# Patient Record
Sex: Female | Born: 1995
Health system: Southern US, Community
[De-identification: ages and names within clinical notes are randomized; demographics above are authoritative.]

## PROBLEM LIST (undated history)

## (undated) DIAGNOSIS — R7303 Prediabetes: Secondary | ICD-10-CM

## (undated) DIAGNOSIS — N83201 Unspecified ovarian cyst, right side: Secondary | ICD-10-CM

## (undated) DIAGNOSIS — F32A Depression, unspecified: Secondary | ICD-10-CM

## (undated) DIAGNOSIS — N83202 Unspecified ovarian cyst, left side: Secondary | ICD-10-CM

## (undated) HISTORY — PX: EYE MUSCLE SURGERY: SHX370

---
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
## 2019-01-07 ENCOUNTER — Emergency Department (HOSPITAL_COMMUNITY)
Admission: EM | Admit: 2019-01-07 | Discharge: 2019-01-07 | Disposition: A | Discharge status: Home / Self Care | Payer: Self-pay | Attending: Emergency Medicine | Admitting: Emergency Medicine

## 2019-01-07 ENCOUNTER — Encounter (HOSPITAL_COMMUNITY): Payer: Self-pay | Admitting: *Deleted

## 2019-01-07 ENCOUNTER — Other Ambulatory Visit: Payer: Self-pay

## 2019-01-07 DIAGNOSIS — D649 Anemia, unspecified: Secondary | ICD-10-CM

## 2019-01-07 DIAGNOSIS — N390 Urinary tract infection, site not specified: Secondary | ICD-10-CM

## 2019-01-07 DIAGNOSIS — R110 Nausea: Secondary | ICD-10-CM | POA: Insufficient documentation

## 2019-01-07 DIAGNOSIS — Z79899 Other long term (current) drug therapy: Secondary | ICD-10-CM | POA: Insufficient documentation

## 2019-01-07 DIAGNOSIS — R7303 Prediabetes: Secondary | ICD-10-CM | POA: Insufficient documentation

## 2019-01-07 DIAGNOSIS — R1030 Lower abdominal pain, unspecified: Secondary | ICD-10-CM | POA: Insufficient documentation

## 2019-01-07 LAB — COMPREHENSIVE METABOLIC PANEL
ALBUMIN: 4.7 g/dL (ref 3.5–5.0)
ALT: 12 U/L (ref 0–44)
AST: 16 U/L (ref 15–41)
Alkaline Phosphatase: 52 U/L (ref 38–126)
Anion gap: 9 (ref 5–15)
BUN: 9 mg/dL (ref 6–20)
CHLORIDE: 108 mmol/L (ref 98–111)
CO2: 23 mmol/L (ref 22–32)
Calcium: 9.6 mg/dL (ref 8.9–10.3)
Creatinine, Ser: 0.73 mg/dL (ref 0.44–1.00)
GFR calc Af Amer: >60 mL/min (ref >60)
GFR calc non Af Amer: >60 mL/min (ref >60)
Glucose, Bld: 91 mg/dL (ref 70–99)
Potassium: 3.4 mmol/L — ABNORMAL LOW (ref 3.5–5.1)
Sodium: 140 mmol/L (ref 135–145)
Total Bilirubin: 0.5 mg/dL (ref 0.3–1.2)
Total Protein: 8.5 g/dL — ABNORMAL HIGH (ref 6.5–8.1)

## 2019-01-07 LAB — PREGNANCY, URINE: Preg Test, Ur: NEGATIVE

## 2019-01-07 LAB — URINALYSIS, ROUTINE W REFLEX MICROSCOPIC
BILIRUBIN URINE: NEGATIVE
Glucose, UA: NEGATIVE mg/dL
Hgb urine dipstick: NEGATIVE
Ketones, ur: NEGATIVE mg/dL
Nitrite: NEGATIVE
PH: 5 (ref 5.0–8.0)
Protein, ur: NEGATIVE mg/dL
Specific Gravity, Urine: 1.021 (ref 1.005–1.030)

## 2019-01-07 LAB — CBC
HCT: 35.3 % — ABNORMAL LOW (ref 36.0–46.0)
HEMOGLOBIN: 9.9 g/dL — AB (ref 12.0–15.0)
MCH: 19.7 pg — ABNORMAL LOW (ref 26.0–34.0)
MCHC: 28 g/dL — ABNORMAL LOW (ref 30.0–36.0)
MCV: 70.2 fL — ABNORMAL LOW (ref 80.0–100.0)
Platelets: 362 10*3/uL (ref 150–400)
RBC: 5.03 MIL/uL (ref 3.87–5.11)
RDW: 27.7 % — ABNORMAL HIGH (ref 11.5–15.5)
WBC: 7.3 10*3/uL (ref 4.0–10.5)
nRBC: 0 % (ref 0.0–0.2)

## 2019-01-07 LAB — LIPASE, BLOOD: Lipase: 32 U/L (ref 11–51)

## 2019-01-07 LAB — I-STAT BETA HCG BLOOD, ED (MC, WL, AP ONLY): I-stat hCG, quantitative: <5 m[IU]/mL (ref <5)

## 2019-01-07 MED ORDER — SODIUM CHLORIDE 0.9% FLUSH: 3.0000 mL | Freq: Once | INTRAVENOUS | Status: DC

## 2019-01-07 MED ORDER — SULFAMETHOXAZOLE-TRIMETHOPRIM 800-160 MG PO TABS: 1.0000 | ORAL_TABLET | Freq: Two times a day (BID) | ORAL | 0 refills | Status: DC

## 2019-01-07 MED ORDER — ONDANSETRON 4 MG PO TBDP: 4.0000 mg | ORAL_TABLET | Freq: Three times a day (TID) | ORAL | 0 refills | Status: DC | PRN

## 2019-01-07 NOTE — ED Provider Notes (Signed)
[facility]-EMERGENCY DEPT Provider Note   CSN: [id number] Arrival date & time: 01/07/19  1633    History   Chief Complaint Chief Complaint  Patient presents with  . Nausea  . Abdominal Pain    HPI    Tammy Hayes is a 23 y.o. female with a PMHx of prediabetes, who presents to the ED with complaints of wanting a pregnancy test, and having lower abd pain and nausea for about a week or so.  Patient is concerned that she may be pregnant because her LMP was around 12/13/2018 but was lighter than normal.  She has been having intermittent lower abdominal pain for about a week or so as well as nausea, so she came in for a pregnancy test.  She describes the pain as 2/10 intermittent achy but sometimes sharp lower abdominal pain that occasionally radiates into her lower back, worse with movement, and with no treatments tried prior to arrival.  She reports nausea as well.  She is sexually active with one female partner, unprotected.  She denies any fevers, chills, chest pain, shortness of breath, vomiting, diarrhea, constipation, melena, hematochezia, dysuria, hematuria, vaginal bleeding or discharge, myalgias, arthralgias, numbness, tingling, focal weakness, or any other complaints at this time.  She denies any recent travel, sick contacts, suspicious food intake, alcohol use, or frequent NSAID use.  The history is provided by the patient and medical records. No language interpreter was used.  Abdominal Pain  Associated symptoms: nausea   Associated symptoms: no chest pain, no chills, no constipation, no diarrhea, no dysuria, no fever, no hematuria, no shortness of breath, no vaginal bleeding, no vaginal discharge and no vomiting     Past Medical History:  Diagnosis Date  . Pre-diabetes     Patient Active Problem List   Diagnosis Date Noted  . CAP (community acquired pneumonia) 04/28/2016  . Prediabetes 07/06/2014  . Acne 05/09/2014  . Acanthosis nigricans 05/09/2014  .  Learning disability 07/13/2013  . Pediatric overweight 07/13/2013    Past Surgical History:  Procedure Laterality Date  . EYE MUSCLE SURGERY     in 3rd grade. No reported residual weakness.      OB History   No obstetric history on file.      Home Medications    Prior to Admission medications   Medication Sig Start Date End Date Taking? Authorizing Provider  benzonatate (TESSALON) 100 MG capsule Take 1 capsule (100 mg total) by mouth 2 (two) times daily as needed for cough. 05/05/16   McKeag, Janine Ores, MD  BIOTIN 5000 PO Take by mouth.    [provider]  ferrous sulfate 325 (65 FE) MG tablet Take 1 tablet (325 mg total) by mouth daily. 06/14/16   Barrett Henle, PA-C    Family History Family History  Problem Relation Age of Onset  . Depression Mother        grandparents, aunts/uncles.   . Heart disease Other        aunts/uncles  . Hypertension Other        aunts/uncles, grandparents  . Kidney disease Other        aunts/uncles, grandparents  . Stroke Other        aunts/uncles    Social History Social History   Tobacco Use  . Smoking status: Never Smoker  . Smokeless tobacco: Never Used  Substance Use Topics  . Alcohol use: No  . Drug use: No     Allergies   Penicillins  Review of Systems Review of Systems  Constitutional: Negative for chills and fever.  Respiratory: Negative for shortness of breath.   Cardiovascular: Negative for chest pain.  Gastrointestinal: Positive for abdominal pain and nausea. Negative for blood in stool, constipation, diarrhea and vomiting.  Genitourinary: Negative for dysuria, hematuria, vaginal bleeding and vaginal discharge.  Musculoskeletal: Negative for arthralgias and myalgias.  Skin: Negative for color change.  Allergic/Immunologic: Negative for immunocompromised state.  Neurological: Negative for weakness and numbness.  Psychiatric/Behavioral: Negative for confusion.   All other systems reviewed and  are negative for acute change except as noted in the HPI.    Physical Exam Updated Vital Signs BP 131/77 (BP Location: Left Arm)   Pulse 96   Temp 98.8 F (37.1 C) (Oral)   Resp 17   Ht 5\' 6"  (1.676 m)   Wt 83.5 kg   LMP 12/13/2018   SpO2 100%   BMI 29.70 kg/m   Physical Exam Vitals signs and nursing note reviewed.  Constitutional:      General: She is not in acute distress.    Appearance: Normal appearance. She is well-developed. She is not toxic-appearing.     Comments: Afebrile, nontoxic, NAD  HENT:     Head: Normocephalic and atraumatic.  Eyes:     General:        Right eye: No discharge.        Left eye: No discharge.     Conjunctiva/sclera: Conjunctivae normal.  Neck:     Musculoskeletal: Normal range of motion and neck supple.  Cardiovascular:     Rate and Rhythm: Normal rate and regular rhythm.     Pulses: Normal pulses.     Heart sounds: Normal heart sounds, S1 normal and S2 normal. No murmur. No friction rub. No gallop.   Pulmonary:     Effort: Pulmonary effort is normal. No respiratory distress.     Breath sounds: Normal breath sounds. No decreased breath sounds, wheezing, rhonchi or rales.  Abdominal:     General: Bowel sounds are normal. There is no distension.     Palpations: Abdomen is soft. Abdomen is not rigid.     Tenderness: There is no abdominal tenderness. There is no right CVA tenderness, left CVA tenderness, guarding or rebound. Negative signs include Murphy's sign and McBurney's sign.     Comments: Soft, NTND, +BS throughout, no r/g/r, neg murphy's, neg mcburney's, no CVA TTP   Musculoskeletal: Normal range of motion.  Skin:    General: Skin is warm and dry.     Findings: No rash.  Neurological:     Mental Status: She is alert and oriented to person, place, and time.     Sensory: Sensation is intact. No sensory deficit.     Motor: Motor function is intact.  Psychiatric:        Mood and Affect: Mood and affect normal.        Behavior:  Behavior normal.      ED Treatments / Results  Labs (all labs ordered are listed, but only abnormal results are displayed) Labs Reviewed  COMPREHENSIVE METABOLIC PANEL - Abnormal; Notable for the following components:      Result Value   Potassium 3.4 (*)    Total Protein 8.5 (*)    All other components within normal limits  CBC - Abnormal; Notable for the following components:   Hemoglobin 9.9 (*)    HCT 35.3 (*)    MCV 70.2 (*)    MCH 19.7 (*)  MCHC 28.0 (*)    RDW 27.7 (*)    All other components within normal limits  URINALYSIS, ROUTINE W REFLEX MICROSCOPIC - Abnormal; Notable for the following components:   APPearance HAZY (*)    Leukocytes,Ua LARGE (*)    Bacteria, UA RARE (*)    All other components within normal limits  LIPASE, BLOOD  PREGNANCY, URINE  I-STAT BETA HCG BLOOD, ED ([facility], [facility], [facility] ONLY)    EKG None  Radiology No results found.  Procedures Procedures (including critical care time)  Medications Ordered in ED Medications  sodium chloride flush (NS) 0.9 % injection 3 mL (has no administration in time range)     Initial Impression / Assessment and Plan / ED Course  I have reviewed the triage vital signs and the nursing notes.  Pertinent labs & imaging results that were available during my care of the patient were reviewed by me and considered in my medical decision making (see chart for details).        23 y.o. female here for intermittent lower abdominal pain and nausea for the last week.  Patient is requesting a pregnancy test.  On exam, no abdominal tenderness, no flank tenderness, afebrile and nontoxic, well-appearing.  Work-up thus far reveals: Beta-hCG negative, lipase WNL, CMP WNL, CBC with chronic anemia at baseline, Upreg neg, U/A with large leuks, 11-20 squamous so some contamination but 21-50 WBCs and rare bacteria; could be contaminated vs UTI. Given symptoms, will treat as UTI. No abd tenderness on exam, no vaginal complaints, doubt  need for pelvic exam. Pt understands she is not currently pregnant and is agreeable to treatment for UTI. Doubt need for further emergent work up at this time. Will send home with abx and zofran, advised OTC remedies for symptomatic relief, and f/up with PCP in 1wk for recheck. I explained the diagnosis and have given explicit precautions to return to the ER including for any other new or worsening symptoms. The patient understands and accepts the medical plan as it's been dictated and I have answered their questions. Discharge instructions concerning home care and prescriptions have been given. The patient is STABLE and is discharged to home in good condition.    Final Clinical Impressions(s) / ED Diagnoses   Final diagnoses:  Lower abdominal pain  Nausea  Chronic anemia  Lower urinary tract infectious disease    ED Discharge Orders         Ordered    ondansetron (ZOFRAN ODT) 4 MG disintegrating tablet  Every 8 hours PRN     01/07/19 2011    sulfamethoxazole-trimethoprim (BACTRIM DS,SEPTRA DS) 800-160 MG tablet  2 times daily     01/07/19 [address] [place], [place] 01/07/19 2015    Virgina Norfolk, DO 01/08/19 0001

## 2019-01-07 NOTE — Discharge Instructions (Signed)
Stay very well hydrated with plenty of water throughout the day. Take antibiotic until completed. May consider over-the-counter Pyridium (Azo) for pain relief, but don't take this longer than 3 days, and be aware that it may turn your urine bright orange. This is a harmless side effect. Alternate between tylenol and motrin as needed for pain. Use zofran as directed as needed for nausea. Follow up with your primary care physician in 1 week for recheck of ongoing symptoms but return to ER for emergent changing or worsening of symptoms. Please seek immediate care if you develop the following: You develop back pain.  Your symptoms are no better, or worse in 3 days. There is severe back pain or lower abdominal pain.  You develop chills.  You have a fever.  There is nausea or vomiting.  There is continued burning or discomfort with urination.

## 2019-01-07 NOTE — ED Triage Notes (Signed)
Pt reports possible pregnancy.  Her LMP was Feb. 17th but she was only spotting.  She reports nausea and lowe abd pain.  She has taken 3 home pregnancy tests which are all negative.

## 2019-01-26 ENCOUNTER — Emergency Department (HOSPITAL_COMMUNITY)
Admission: EM | Admit: 2019-01-26 | Discharge: 2019-01-26 | Disposition: A | Discharge status: Home / Self Care | Payer: Self-pay | Attending: Emergency Medicine | Admitting: Emergency Medicine

## 2019-01-26 ENCOUNTER — Other Ambulatory Visit: Payer: Self-pay

## 2019-01-26 DIAGNOSIS — N926 Irregular menstruation, unspecified: Secondary | ICD-10-CM | POA: Insufficient documentation

## 2019-01-26 DIAGNOSIS — Z3202 Encounter for pregnancy test, result negative: Secondary | ICD-10-CM | POA: Insufficient documentation

## 2019-01-26 DIAGNOSIS — Z79899 Other long term (current) drug therapy: Secondary | ICD-10-CM | POA: Insufficient documentation

## 2019-01-26 LAB — URINALYSIS, ROUTINE W REFLEX MICROSCOPIC
Bilirubin Urine: NEGATIVE
Glucose, UA: NEGATIVE mg/dL
Hgb urine dipstick: NEGATIVE
Ketones, ur: NEGATIVE mg/dL
Nitrite: NEGATIVE
Protein, ur: NEGATIVE mg/dL
Specific Gravity, Urine: 1.029 (ref 1.005–1.030)
WBC, UA: >50 WBC/hpf — ABNORMAL HIGH (ref 0–5)
pH: 5 (ref 5.0–8.0)

## 2019-01-26 LAB — POC URINE PREG, ED: Preg Test, Ur: NEGATIVE

## 2019-01-26 NOTE — ED Triage Notes (Signed)
Patient states that she would like a pregnancy test because she was having all the symptoms of being pregnant but has had negative pregnancy tests. Was seen at [facility] for same and dx UTI and was prescribed abx but has not taken them.

## 2019-01-26 NOTE — ED Provider Notes (Signed)
[facility] EMERGENCY DEPARTMENT Provider Note   CSN: [id number] Arrival date & time: 01/26/19  0636    History   Chief Complaint Chief Complaint  Patient presents with  . Possible Pregnancy    HPI Tammy Hayes is a 23 y.o. female presents today requesting pregnancy test.  When asked what brought her into the emergency department today in the midst of the COVID-19 pandemic she states "I do not know, to make sure I am not pregnant ".  She reports that she had a period lasting 6 days around 12/13/2018 but was lighter than usual for her.  She has not had any vaginal bleeding since then.  She states that she has a history of irregular menses so this is not unusual for her.  She was seen and evaluated at [facility] emergency department on 01/07/2019 for the symptoms and was found to have a negative pregnancy test, benign abdominal exam, and reassuring lab work.  She denies any abdominal pain, urinary symptoms, fevers, chills, chest pain, shortness of breath, melena, hematochezia, diarrhea, or constipation.  She does report that she had some nausea and vomiting for 3 days a few weeks ago but none since then.  He is currently sexually active with one female partner and does not use protection.  She denies vaginal itching, bleeding, or pain.  She has no concern for STDs.     The history is provided by the patient.    Past Medical History:  Diagnosis Date  . Pre-diabetes     Patient Active Problem List   Diagnosis Date Noted  . CAP (community acquired pneumonia) 04/28/2016  . Prediabetes 07/06/2014  . Acne 05/09/2014  . Acanthosis nigricans 05/09/2014  . Learning disability 07/13/2013  . Pediatric overweight 07/13/2013    Past Surgical History:  Procedure Laterality Date  . EYE MUSCLE SURGERY     in 3rd grade. No reported residual weakness.      OB History   No obstetric history on file.      Home Medications    Prior to Admission medications   Medication Sig  Start Date End Date Taking? Authorizing Provider  benzonatate (TESSALON) 100 MG capsule Take 1 capsule (100 mg total) by mouth 2 (two) times daily as needed for cough. Patient not taking: Reported on 01/07/2019 05/05/16   McKeag, Janine Ores, MD  ferrous sulfate 325 (65 FE) MG tablet Take 1 tablet (325 mg total) by mouth daily. Patient not taking: Reported on 01/07/2019 06/14/16   Barrett Henle, PA-C  ondansetron (ZOFRAN ODT) 4 MG disintegrating tablet Take 1 tablet (4 mg total) by mouth every 8 (eight) hours as needed for nausea or vomiting. 01/07/19   Street, [place], PA-C  sulfamethoxazole-trimethoprim (BACTRIM DS,SEPTRA DS) 800-160 MG tablet Take 1 tablet by mouth 2 (two) times daily. 01/07/19   Street, [place], PA-C    Family History Family History  Problem Relation Age of Onset  . Depression Mother        grandparents, aunts/uncles.   . Heart disease Other        aunts/uncles  . Hypertension Other        aunts/uncles, grandparents  . Kidney disease Other        aunts/uncles, grandparents  . Stroke Other        aunts/uncles    Social History Social History   Tobacco Use  . Smoking status: Never Smoker  . Smokeless tobacco: Never Used  Substance Use Topics  . Alcohol use:  No  . Drug use: No     Allergies   Penicillins   Review of Systems Review of Systems  Constitutional: Negative for chills and fever.  Respiratory: Negative for shortness of breath.   Cardiovascular: Negative for chest pain.  Gastrointestinal: Negative for abdominal pain, nausea and vomiting.  Genitourinary: Negative for dysuria, frequency, urgency, vaginal bleeding, vaginal discharge and vaginal pain.  All other systems reviewed and are negative.    Physical Exam Updated Vital Signs BP (!) 149/87   Pulse (!) 102   Temp 98.1 F (36.7 C) (Oral)   Resp 16   Ht 5\' 6"  (1.676 m)   Wt 84 kg   LMP 12/13/2018 (Exact Date)   SpO2 100%   BMI 29.89 kg/m   Physical Exam Vitals signs and  nursing note reviewed.  Constitutional:      General: She is not in acute distress.    Appearance: Normal appearance. She is well-developed.  HENT:     Head: Normocephalic and atraumatic.  Eyes:     General:        Right eye: No discharge.        Left eye: No discharge.     Conjunctiva/sclera: Conjunctivae normal.  Neck:     Vascular: No JVD.     Trachea: No tracheal deviation.  Cardiovascular:     Rate and Rhythm: Normal rate and regular rhythm.     Heart sounds: Normal heart sounds.     Comments: Borderline tachycardic Pulmonary:     Effort: Pulmonary effort is normal.     Breath sounds: Normal breath sounds.  Abdominal:     General: Abdomen is flat. There is no distension.     Palpations: Abdomen is soft.     Tenderness: There is no abdominal tenderness. There is no right CVA tenderness, left CVA tenderness, guarding or rebound.  Skin:    General: Skin is warm and dry.     Findings: No erythema.  Neurological:     Mental Status: She is alert.  Psychiatric:        Behavior: Behavior normal.      ED Treatments / Results  Labs (all labs ordered are listed, but only abnormal results are displayed) Labs Reviewed  URINALYSIS, ROUTINE W REFLEX MICROSCOPIC  POC URINE PREG, ED    EKG None  Radiology No results found.  Procedures Procedures (including critical care time)  Medications Ordered in ED Medications - No data to display   Initial Impression / Assessment and Plan / ED Course  I have reviewed the triage vital signs and the nursing notes.  Pertinent labs & imaging results that were available during my care of the patient were reviewed by me and considered in my medical decision making (see chart for details).        Patient presenting requesting pregnancy test.  She is afebrile, borderline tachycardic but her baseline heart rate is in the 90s it appears on chart review.  She is nontoxic in appearance.  No complaint of chest pain or shortness of  breath.  Abdominal examination is benign.  Reports that she has a history of irregular menstrual cycles.  She is overall very well-appearing. She has no medical complaints at this time.  Her pregnancy test is negative.  Seen in the ED a few weeks ago for the same; UA was questionable at the time for contamination. Patient did not take antibiotics that were prescribed to her which I think is reasonable as she denied urinary  symptoms at the time and denies any today. Doubt acute surgical abdominal or GU process. We discussed that home pregnancy tests are just as accurate as tests in the ED. Recommend follow up with OBGYN for re-evaluation of irregular menses and discussion of possible contraceptive options. Discussed ED return precautions. Pt verbalized understanding of and agreement with plan and is safe for discharge home at this time.   Final Clinical Impressions(s) / ED Diagnoses   Final diagnoses:  Negative pregnancy test    ED Discharge Orders    None       Bennye Alm 01/26/19 0732    Linwood Dibbles, MD 01/27/19 1053

## 2019-01-26 NOTE — Discharge Instructions (Addendum)
Your pregnancy test today was negative.  I would avoid coming into the emergency department unless you have an emergency (such as chest pains, persistent/severe shortness of breath, persistent vomiting, severe abdominal pains, etc.), especially during the current COVID-19 pandemic. Please make sure to shower immediately upon arriving home and wash clothing in hot water to avoid passing on any possible exposure to COVID-19 while you were here.   Follow-up with OB/GYN to establish care and discuss possible birth control options/workup for your irregular periods.

## 2019-04-12 ENCOUNTER — Ambulatory Visit: Payer: Self-pay | Admitting: Family Medicine

## 2019-04-19 ENCOUNTER — Encounter: Payer: Self-pay | Admitting: Internal Medicine

## 2019-04-19 ENCOUNTER — Other Ambulatory Visit: Payer: Self-pay

## 2019-04-19 ENCOUNTER — Ambulatory Visit: Payer: Self-pay | Admitting: Internal Medicine

## 2019-04-19 DIAGNOSIS — D509 Iron deficiency anemia, unspecified: Secondary | ICD-10-CM | POA: Insufficient documentation

## 2019-04-19 DIAGNOSIS — D500 Iron deficiency anemia secondary to blood loss (chronic): Secondary | ICD-10-CM

## 2019-04-19 DIAGNOSIS — N920 Excessive and frequent menstruation with regular cycle: Secondary | ICD-10-CM

## 2019-04-19 NOTE — Patient Instructions (Signed)
It was nice seeing you today. Thank you for choosing [facility] for your Primary Care.   Today we talked about:  1) heavy periods, anemia  - start taking liquid iron every other day with food   - think about birth control IUD (intrauterine device) vs. The pill   - If your periods remain heavy for several more months, even after starting birth control, we can revisit the idea of working it up with an ultrasound and blood work    FOLLOW-UP INSTRUCTIONS When: 2 weeks For: pap smear, birth control  What to bring:   Please contact the clinic if you have any problems, or need to be seen sooner.    Intrauterine Device Information An intrauterine device (IUD) is a medical device that is inserted in the uterus to prevent pregnancy. It is a small, T-shaped device that has one or two nylon strings hanging down from it. The strings hang out of the lower part of the uterus (cervix) to allow for future IUD removal. There are two types of IUDs available:  Hormone IUD. This type of IUD is made of plastic and contains the hormone progestin (synthetic progesterone). A hormone IUD may last 3-5 years.  Copper IUD. This type of IUD has copper wire wrapped around it. A copper IUD may last up to 10 years. How is an IUD inserted? An IUD is inserted through the vagina and placed into the uterus with a minor medical procedure. The exact procedure for IUD insertion may vary among health care providers and hospitals. How does an IUD work? Synthetic progesterone in a hormonal IUD prevents pregnancy by:  Thickening cervical mucus to prevent sperm from entering the uterus.  Thinning the uterine lining to prevent a fertilized egg from being implanted there. Copper in a copper IUD prevents pregnancy by making the uterus and fallopian tubes produce a fluid that kills sperm. What are the advantages of an IUD? Advantages of either type of IUD  It is highly effective in preventing pregnancy.  It is  reversible. You can become pregnant shortly after the IUD is removed.  It is low-maintenance and can stay in place for a long time.  There are no estrogen-related side effects.  It can be used when breastfeeding.  It is not associated with weight gain.  It can be inserted right after childbirth, an abortion, or a miscarriage. Advantages of a hormone IUD  If it is inserted within 7 days of your period starting, it works right after it is inserted. If the hormone IUD is inserted at any other time in your cycle, you will need to use a backup method of birth control for 7 days after insertion.  It can make menstrual periods lighter.  It can reduce menstrual cramping.  It can be used for 3-5 years. Advantages of a copper IUD  It works right after it is inserted.  It can be used as a form of emergency birth control if it is inserted within 5 days after having unprotected sex.  It does not interfere with your body's natural hormones.  It can be used for 10 years. What are the disadvantages of an IUD?  An IUD may cause irregular menstrual bleeding for a period of time after insertion.  You may have pain during insertion and have cramping and vaginal bleeding after insertion.  An IUD may cut the uterus (uterine perforation) when it is inserted. This is rare.  An IUD may cause pelvic inflammatory disease (PID), which  is an infection in the uterus and fallopian tubes. This is rare, and it usually happens during the first 20 days after the IUD is inserted.  A copper IUD can make your menstrual flow heavier and more painful. How is an IUD removed?  You will lie on your back with your knees bent and your feet in footrests (stirrups).  A device will be inserted into your vagina to spread apart the vaginal walls (speculum). This will allow your health care provider to see the strings attached to the IUD.  Your health care provider will use a small instrument (forceps) to grasp the IUD  strings and pull firmly until the IUD is removed. You may have some discomfort when the IUD is removed. Your health care provider may recommend taking over-the-counter pain relievers, such as ibuprofen, before the procedure. You may also have minor spotting for a few days after the procedure. The exact procedure for IUD removal may vary among health care providers and hospitals. Is the IUD right for me? Your health care provider will make sure you are a good candidate for an IUD and will discuss the advantages, disadvantages, and possible side effects with you. Summary  An intrauterine device (IUD) is a medical device that is inserted in the uterus to prevent pregnancy. It is a small, T-shaped device that has one or two nylon strings hanging down from it.  A hormone IUD contains the hormone progestin (synthetic progesterone). A copper IUD has copper wire wrapped around it.  Synthetic progesterone in a hormone IUD prevents pregnancy by thickening cervical mucus and thinning the walls of the uterus. Copper in a copper IUD prevents pregnancy by making the uterus and fallopian tubes produce a fluid that kills sperm.  A hormone IUD can be left in place for 3-5 years. A copper IUD can be left in place for up to 10 years.  An IUD is inserted and removed by a health care provider. You may feel some pain during insertion and removal. Your health care provider may recommend taking over-the-counter pain medicine, such as ibuprofen, before an IUD procedure. This information is not intended to replace advice given to you by your health care provider. Make sure you discuss any questions you have with your health care provider. Document Released: 09/16/2004 Document Revised: 11/11/2016 Document Reviewed: 11/11/2016 Elsevier Interactive Patient Education  2019 [employer].

## 2019-04-19 NOTE — Progress Notes (Signed)
   CC: establish care, menorrhagia   HPI:  [name] is a 23 y.o. F with menorrhagia and iron deficiency anemia presenting to establish care and discuss menorrhagia and contraception. She has one female sexual partner and does not use any form of contraception. She denies previous pregnancy (despite having unprotected sex) or STIs and is currently on her period. Her menstrual cycles are regular, occurring monthly with mild cramping relieved by ibuprofen. She states that for the past two months, her periods have been heavier than usual and she has spotting at the beginning and end. She uses 2 maxi pads a day and will pass small clots of blood. She denies sob, chest pain, palpitations, dizziness, or fatigue. She does admit to cravings for eating ice and corn starch. She has been on iron supplementation in the past but it made her stomach hurt.   FHx: aunt with menorrhagia. No family history of fibroids or PCOS SHx: no alcohol or tobacco use   Past Medical History:  Diagnosis Date  . Pre-diabetes     Physical Exam:  Vitals:   04/19/19 0923  Weight: 203 lb 12.8 oz (92.4 kg)   Gen: well appearing female HENT: moist MM Skin: normal skin pallor  Cardiac: RRR, no m/r/g Pulm: CTAB Abd: soft, NT, ND, +BS  Assessment & Plan:   See Encounters Tab for problem based charting.  Patient discussed with Dr. Lynnae January

## 2019-04-19 NOTE — Assessment & Plan Note (Signed)
2 months of heavier periods. Also notes hirsutism, pre-diabetes, and possibly infertility given lack of pregnancy and unprotected intercourse. Denies dyspareunia  Has an aunt with menorrhagia. Possibly fibroids but given that menorrhagia has only worsened for 2 months I do not think a diagnostic ultrasound is indicated at this time. Doubt PCOS since menstrual cycles are not irregular. Doubt coagulopathy given lack of chronicity of menorrhagia. Doubt adenomyosis due to lack of chronic pelvic pain. Discussed contraception as a way to help control menorrhagia. She is interested in IUD but would like to discuss with family.   - return in 1-2 weeks after completion of menstrual bleeding - perform pap smear at f/u and inquire about her contraception decision (IUD vs OCPs)

## 2019-04-19 NOTE — Assessment & Plan Note (Signed)
Chronic. 2/2 menorrhagia. Denies symptomatic anemia (sob, palpitations, dizziness) but does endorse PICA (ice, corn starch). Previously unable to tolerate po iron due to GI side effects. Menstrual cycle continues to be regular and heavy. Unable to afford IV iron given insurance status.   - OTC liquid iron qod, if tolerating ok can increase to daily  - discussed birth control options to help decrease menstrual flow

## 2019-04-22 NOTE — Progress Notes (Signed)
[facility] Clinic Attending  Case discussed with Dr. Vogel at the time of the visit.  We reviewed the resident's history and exam and pertinent patient test results.  I agree with the assessment, diagnosis, and plan of care documented in the resident's note.   

## 2019-05-03 ENCOUNTER — Encounter: Payer: Self-pay | Admitting: Internal Medicine

## 2019-05-03 ENCOUNTER — Other Ambulatory Visit (HOSPITAL_COMMUNITY)
Admission: RE | Admit: 2019-05-03 | Discharge: 2019-05-03 | Disposition: A | Discharge status: Home / Self Care | Payer: Medicaid Other | Source: Ambulatory Visit | Attending: Internal Medicine | Admitting: Internal Medicine

## 2019-05-03 ENCOUNTER — Ambulatory Visit: Payer: Self-pay | Admitting: Internal Medicine

## 2019-05-03 ENCOUNTER — Other Ambulatory Visit: Payer: Self-pay

## 2019-05-03 VITALS — BP 140/75 | HR 104 | Temp 98.0°F | Ht 67.0 in | Wt 206.7 lb

## 2019-05-03 DIAGNOSIS — N399 Disorder of urinary system, unspecified: Secondary | ICD-10-CM

## 2019-05-03 DIAGNOSIS — D500 Iron deficiency anemia secondary to blood loss (chronic): Secondary | ICD-10-CM

## 2019-05-03 DIAGNOSIS — R7303 Prediabetes: Secondary | ICD-10-CM

## 2019-05-03 DIAGNOSIS — N920 Excessive and frequent menstruation with regular cycle: Secondary | ICD-10-CM | POA: Insufficient documentation

## 2019-05-03 DIAGNOSIS — I10 Essential (primary) hypertension: Secondary | ICD-10-CM | POA: Insufficient documentation

## 2019-05-03 LAB — POCT GLYCOSYLATED HEMOGLOBIN (HGB A1C): Hemoglobin A1C: 5.5 % (ref 4.0–5.6)

## 2019-05-03 LAB — GLUCOSE, CAPILLARY: Glucose-Capillary: 104 mg/dL — ABNORMAL HIGH (ref 70–99)

## 2019-05-03 NOTE — Progress Notes (Signed)
   CC: heavy periods  HPI:   Ms.Winni E Tsou is a 23 y.o. female w/ medical history listed below presenting for follow up on her menorrhagia. Patient reports having heavy menstrual periods since March 2020 for which she was seen in the ED and last month in the clinic. She is here for pap smear today and to follow up on birth control options. Her LMP was 2 weeks ago. Patient reports intermittent episodes of dizziness. ROS otherwise negative.   Past Medical History:  Diagnosis Date  . Pre-diabetes    Review of Systems:  Review of Systems  Constitutional: Negative for chills, fever, malaise/fatigue and weight loss.  Respiratory: Negative for shortness of breath.   Cardiovascular: Negative for chest pain.  Gastrointestinal: Negative for abdominal pain, blood in stool, constipation, diarrhea, melena, nausea and vomiting.  Genitourinary: Negative for dysuria, frequency, hematuria and urgency.  Musculoskeletal: Negative for myalgias.  Neurological: Positive for dizziness. Negative for sensory change, weakness and headaches.  Endo/Heme/Allergies: Does not bruise/bleed easily.     Physical Exam: Physical Exam  Constitutional: She is oriented to person, place, and time and well-developed, well-nourished, and in no distress.  HENT:  Head: Normocephalic and atraumatic.  Eyes: Conjunctivae are normal.  Neck: Normal range of motion.  Cardiovascular: Normal rate, regular rhythm, normal heart sounds and intact distal pulses. Exam reveals no gallop and no friction rub.  No murmur heard. Pulmonary/Chest: Effort normal and breath sounds normal. No respiratory distress. She has no wheezes. She has no rales.  Abdominal: Soft. Bowel sounds are normal. She exhibits no distension and no mass. There is no abdominal tenderness. There is no rebound and no guarding.  Genitourinary:    Genitourinary Comments: Cervix erythematous with associated thick white discharge   Musculoskeletal: Normal range of motion.   Neurological: She is alert and oriented to person, place, and time.  Skin: Skin is warm and dry.  Psychiatric: Mood, memory and affect normal.     Vitals:   05/03/19 1020  BP: 140/75  Pulse: (!) 104  Temp: 98 F (36.7 C)  TempSrc: Oral  SpO2: 100%  Weight: 206 lb 11.2 oz (93.8 kg)  Height: 5\' 7"  (1.702 m)    Assessment & Plan:   See Encounters Tab for problem based charting.  Patient seen with Dr. Angelia Mould

## 2019-05-03 NOTE — Patient Instructions (Addendum)
[name],  It was a pleasure seeing you in the clinic today. Today, we discussed your heavy periods, anemia and contraception options. We performed a pap smear at this visit.  We will contact you with any abnormal results.   - Continue taking liquid iron every other day with food - Think about birth control options provided and follow up with gynecologist  - Your  blood pressure was elevated at this visit. Please consider lifestyle modifications as we discussed.  - Return to clinic in 3 months for follow up on blood pressure or sooner if any other concerns.     Contraception Choices Contraception, also called birth control, means things to use or ways to try not to get pregnant. Hormonal birth control This kind of birth control uses hormones. Here are some types of hormonal birth control:  A tube that is put under skin of the arm (implant). The tube can stay in for as long as 3 years.  Shots to get every 3 months (injections).  Pills to take every day (birth control pills).  A patch to change 1 time each week for 3 weeks (birth control patch). After that, the patch is taken off for 1 week.  A ring to put in the vagina. The ring is left in for 3 weeks. Then it is taken out of the vagina for 1 week. Then a new ring is put in.  Pills to take after unprotected sex (emergency birth control pills). Barrier birth control Here are some types of barrier birth control:  A thin covering that is put on the penis before sex (female condom). The covering is thrown away after sex.  A soft, loose covering that is put in the vagina before sex (female condom). The covering is thrown away after sex.  A rubber bowl that sits over the cervix (diaphragm). The bowl must be made for you. The bowl is put into the vagina before sex. The bowl is left in for 6-8 hours after sex. It is taken out within 24 hours.  A small, soft cup that fits over the cervix (cervical cap). The cup must be made for you. The cup can  be left in for 6-8 hours after sex. It is taken out within 48 hours.  A sponge that is put into the vagina before sex. It must be left in for at least 6 hours after sex. It must be taken out within 30 hours. Then it is thrown away.  A chemical that kills or stops sperm from getting into the uterus (spermicide). It may be a pill, cream, jelly, or foam to put in the vagina. The chemical should be used at least 10-15 minutes before sex. IUD (intrauterine) birth control An IUD is a small, T-shaped piece of plastic. It is put inside the uterus. There are two kinds:  Hormone IUD. This kind can stay in for 3-5 years.  Copper IUD. This kind can stay in for 10 years. Permanent birth control Here are some types of permanent birth control:  Surgery to block the fallopian tubes.  Having an insert put into each fallopian tube.  Surgery to tie off the tubes that carry sperm (vasectomy). Natural planning birth control Here are some types of natural planning birth control:  Not having sex on the days the woman could get pregnant.  Using a calendar: ? To keep track of the length of each period. ? To find out what days pregnancy can happen. ? To plan to not have  sex on days when pregnancy can happen.  Watching for symptoms of ovulation and not having sex during ovulation. One way the woman can check for ovulation is to check her temperature.  Waiting to have sex until after ovulation. Summary  Contraception, also called birth control, means things to use or ways to try not to get pregnant.  Hormonal methods of birth control include implants, injections, pills, patches, vaginal rings, and emergency birth control pills.  Barrier methods of birth control can include female condoms, female condoms, diaphragms, cervical caps, sponges, and spermicides.  There are two types of IUD (intrauterine device) birth control. An IUD can be put in a woman's uterus to prevent pregnancy for 3-5 years.  Permanent  sterilization can be done through a procedure for males, females, or both.  Natural planning methods involve not having sex on the days when the woman could get pregnant. This information is not intended to replace advice given to you by your health care provider. Make sure you discuss any questions you have with your health care provider. Document Released: 08/10/2009 Document Revised: 02/02/2019 Document Reviewed: 10/23/2016 Elsevier Patient Education  2020 [employer].      Hypertension, Adult Hypertension is another name for high blood pressure. High blood pressure forces your heart to work harder to pump blood. This can cause problems over time. There are two numbers in a blood pressure reading. There is a top number (systolic) over a bottom number (diastolic). It is best to have a blood pressure that is below 120/80. Healthy choices can help lower your blood pressure, or you may need medicine to help lower it. What are the causes? The cause of this condition is not known. Some conditions may be related to high blood pressure. What increases the risk?  Smoking.  Having type 2 diabetes mellitus, high cholesterol, or both.  Not getting enough exercise or physical activity.  Being overweight.  Having too much fat, sugar, calories, or salt (sodium) in your diet.  Drinking too much alcohol.  Having long-term (chronic) kidney disease.  Having a family history of high blood pressure.  Age. Risk increases with age.  Race. You may be at higher risk if you are African American.  Gender. Men are at higher risk than women before age 49. After age 63, women are at higher risk than men.  Having obstructive sleep apnea.  Stress. What are the signs or symptoms?  High blood pressure may not cause symptoms. Very high blood pressure (hypertensive crisis) may cause: ? Headache. ? Feelings of worry or nervousness (anxiety). ? Shortness of breath. ? Nosebleed. ? A feeling of being  sick to your stomach (nausea). ? Throwing up (vomiting). ? Changes in how you see. ? Very bad chest pain. ? Seizures. How is this treated?  This condition is treated by making healthy lifestyle changes, such as: ? Eating healthy foods. ? Exercising more. ? Drinking less alcohol.  Your health care provider may prescribe medicine if lifestyle changes are not enough to get your blood pressure under control, and if: ? Your top number is above 130. ? Your bottom number is above 80.  Your personal target blood pressure may vary. Follow these instructions at home: Eating and drinking   If told, follow the DASH eating plan. To follow this plan: ? Fill one half of your plate at each meal with fruits and vegetables. ? Fill one fourth of your plate at each meal with whole grains. Whole grains include whole-wheat pasta, brown  rice, and whole-grain bread. ? Eat or drink low-fat dairy products, such as skim milk or low-fat yogurt. ? Fill one fourth of your plate at each meal with low-fat (lean) proteins. Low-fat proteins include fish, chicken without skin, eggs, beans, and tofu. ? Avoid fatty meat, cured and processed meat, or chicken with skin. ? Avoid pre-made or processed food.  Eat less than 1,500 mg of salt each day.  Do not drink alcohol if: ? Your doctor tells you not to drink. ? You are pregnant, may be pregnant, or are planning to become pregnant.  If you drink alcohol: ? Limit how much you use to:  0-1 drink a day for women.  0-2 drinks a day for men. ? Be aware of how much alcohol is in your drink. In the [place] one drink equals one 12 oz bottle of beer (355 mL), one 5 oz glass of wine (148 mL), or one 1 oz glass of hard liquor (44 mL). Lifestyle   Work with your doctor to stay at a healthy weight or to lose weight. Ask your doctor what the best weight is for you.  Get at least 30 minutes of exercise most days of the week. This may include walking, swimming, or  biking.  Get at least 30 minutes of exercise that strengthens your muscles (resistance exercise) at least 3 days a week. This may include lifting weights or doing Pilates.  Do not use any products that contain nicotine or tobacco, such as cigarettes, e-cigarettes, and chewing tobacco. If you need help quitting, ask your doctor.  Check your blood pressure at home as told by your doctor.  Keep all follow-up visits as told by your doctor. This is important. Medicines  Take over-the-counter and prescription medicines only as told by your doctor. Follow directions carefully.  Do not skip doses of blood pressure medicine. The medicine does not work as well if you skip doses. Skipping doses also puts you at risk for problems.  Ask your doctor about side effects or reactions to medicines that you should watch for. Contact a doctor if you:  Think you are having a reaction to the medicine you are taking.  Have headaches that keep coming back (recurring).  Feel dizzy.  Have swelling in your ankles.  Have trouble with your vision. Get help right away if you:  Get a very bad headache.  Start to feel mixed up (confused).  Feel weak or numb.  Feel faint.  Have very bad pain in your: ? Chest. ? Belly (abdomen).  Throw up more than once.  Have trouble breathing. Summary  Hypertension is another name for high blood pressure.  High blood pressure forces your heart to work harder to pump blood.  For most people, a normal blood pressure is less than 120/80.  Making healthy choices can help lower blood pressure. If your blood pressure does not get lower with healthy choices, you may need to take medicine. This information is not intended to replace advice given to you by your health care provider. Make sure you discuss any questions you have with your health care provider. Document Released: 03/31/2008 Document Revised: 06/23/2018 Document Reviewed: 06/23/2018 Elsevier Patient  Education  2020 [employer]

## 2019-05-03 NOTE — Assessment & Plan Note (Signed)
Secondary to menorrhagia. Patient reports episodic dizziness and headaches. Patient reports not taking her liquid iron.   - Repeat CBC with diff and ferritin levels - Encouraged to take liquid iron as previously advised  - Discussed birth control options

## 2019-05-03 NOTE — Assessment & Plan Note (Addendum)
Patient HbA1c 5.5 at this visit.   - Encouraged lifestyle modifications and increase exercise - Continue to monitor

## 2019-05-03 NOTE — Assessment & Plan Note (Signed)
BP 140/75 at this visit. Patient does not have history of hypertension. At this time, advised on lifestyle modifications.  - Lifestyle modifications (DASH diet) and exercise - Follow up for BP check in 3 months

## 2019-05-03 NOTE — Assessment & Plan Note (Signed)
Patient reports heavy menstrual periods since March 2020. She reports regular 28-30 day cycle with 6-7 days of menstruation. She reports first few days of spotting followed by normal flow and then heavy flow for last few days. She reports using 1 maxi pad per day. She has associated abdominal cramping during her period relieved with acetominophen and ibuprofen. Menarche at age 23. LMP was 2wks ago. Pap smear performed at this visit. Cervix appeared erythematous on visual inspection with white discharge.  GC and HIV testing also done at this visit.  Discussed contraception options with patient as she is sexually active with one female partner and does not use protection. Also discussed HPV vaccine. However, unknown if patient has received the vaccine or not from pediatrician. Patient will get records from pediatrician.   - Follow up with gynecology regarding IUD placement  - Follow up with pap smear, GC, HIV and wet prep results - Continue to monitor symptoms

## 2019-05-04 LAB — CERVICOVAGINAL ANCILLARY ONLY
Bacterial vaginitis: POSITIVE — AB
Candida vaginitis: NEGATIVE
Chlamydia: NEGATIVE
Neisseria Gonorrhea: NEGATIVE
Trichomonas: NEGATIVE

## 2019-05-04 LAB — URINALYSIS, ROUTINE W REFLEX MICROSCOPIC
Bilirubin, UA: NEGATIVE
Glucose, UA: NEGATIVE
Ketones, UA: NEGATIVE
Leukocytes,UA: NEGATIVE
Nitrite, UA: NEGATIVE
RBC, UA: NEGATIVE
Specific Gravity, UA: >=1.03 — AB (ref 1.005–1.030)
Urobilinogen, Ur: 0.2 mg/dL (ref 0.2–1.0)
pH, UA: 5.5 (ref 5.0–7.5)

## 2019-05-04 LAB — CBC WITH DIFFERENTIAL/PLATELET
Basophils Absolute: 0 10*3/uL (ref 0.0–0.2)
Basos: 1 %
EOS (ABSOLUTE): 0 10*3/uL (ref 0.0–0.4)
Eos: 0 %
Hematocrit: 30 % — ABNORMAL LOW (ref 34.0–46.6)
Hemoglobin: 8.7 g/dL — ABNORMAL LOW (ref 11.1–15.9)
Immature Grans (Abs): 0 10*3/uL (ref 0.0–0.1)
Immature Granulocytes: 0 %
Lymphocytes Absolute: 1.4 10*3/uL (ref 0.7–3.1)
Lymphs: 24 %
MCH: 20.3 pg — ABNORMAL LOW (ref 26.6–33.0)
MCHC: 29 g/dL — ABNORMAL LOW (ref 31.5–35.7)
MCV: 70 fL — ABNORMAL LOW (ref 79–97)
Monocytes Absolute: 0.4 10*3/uL (ref 0.1–0.9)
Monocytes: 7 %
Neutrophils Absolute: 3.9 10*3/uL (ref 1.4–7.0)
Neutrophils: 68 %
Platelets: 330 10*3/uL (ref 150–450)
RBC: 4.28 x10E6/uL (ref 3.77–5.28)
RDW: 22.7 % — ABNORMAL HIGH (ref 11.7–15.4)
WBC: 5.8 10*3/uL (ref 3.4–10.8)

## 2019-05-04 LAB — HIV ANTIBODY (ROUTINE TESTING W REFLEX): HIV Screen 4th Generation wRfx: NONREACTIVE

## 2019-05-04 LAB — FERRITIN: Ferritin: 3 ng/mL — ABNORMAL LOW (ref 15–150)

## 2019-05-05 LAB — CYTOLOGY - PAP: Diagnosis: HIGH — AB

## 2019-05-06 ENCOUNTER — Telehealth: Payer: Self-pay | Admitting: Internal Medicine

## 2019-05-06 ENCOUNTER — Other Ambulatory Visit: Payer: Self-pay | Admitting: Internal Medicine

## 2019-05-06 MED ORDER — FERROUS SULFATE 75 (15 FE) MG/ML PO SOLN: 15.0000 mg | ORAL | 0 refills | Status: DC

## 2019-05-06 NOTE — Telephone Encounter (Signed)
Pt is calling regarding results [phone]

## 2019-05-10 NOTE — Progress Notes (Addendum)
Internal Medicine Clinic Attending  I saw and evaluated the patient.  I personally confirmed the key portions of the history and exam documented by Dr. Marva Panda and I reviewed pertinent patient test results.  The assessment, diagnosis, and plan were formulated together and I agree with the documentation in the resident's note.   Glenda RN present as chaperone during pelvic exam.  She did have some tenderness with white/brown discharge, wet prep notable for BV however patient denies any symptoms.  Labs otherwise notable for IDA for which she was instructed to take oral iron supplementation.  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

## 2019-05-11 ENCOUNTER — Ambulatory Visit: Payer: Medicaid Other

## 2019-05-11 ENCOUNTER — Encounter: Payer: Self-pay | Admitting: Internal Medicine

## 2019-05-19 NOTE — Progress Notes (Signed)
Spoke with patient on 7/10 regarding results of pap smear, ancillary testing and CBC.  Instructed patient to follow up with gynecologist for colposcopy. Wet prep +ve for vaginosis; however, patient denies any symptoms at this time so will hold off on Flagyl for now.  Patient also notified of her anemia and recommended that she take ferrous sulfate. She reports that she will need new Rx. Rx sent to pharmacy.

## 2019-05-25 ENCOUNTER — Ambulatory Visit: Payer: Medicaid Other

## 2019-06-13 ENCOUNTER — Encounter: Payer: Medicaid Other | Admitting: Family Medicine

## 2019-06-14 ENCOUNTER — Encounter: Payer: Self-pay | Admitting: Family Medicine

## 2019-06-14 ENCOUNTER — Encounter: Payer: Medicaid Other | Admitting: Family Medicine

## 2019-06-14 NOTE — Progress Notes (Signed)
Patient did not keep appointment today. She may call to reschedule.  

## 2019-06-22 NOTE — Addendum Note (Signed)
Addended by: Hulan Fray on: 06/22/2019 08:33 PM   Modules accepted: Orders

## 2019-09-06 ENCOUNTER — Encounter: Payer: Medicaid Other | Admitting: Internal Medicine

## 2019-09-06 ENCOUNTER — Encounter: Payer: Self-pay | Admitting: Internal Medicine

## 2020-04-28 ENCOUNTER — Emergency Department (HOSPITAL_COMMUNITY)
Admission: EM | Admit: 2020-04-28 | Discharge: 2020-04-28 | Disposition: A | Discharge status: Home / Self Care | Payer: Medicaid Other | Attending: Emergency Medicine | Admitting: Emergency Medicine

## 2020-04-28 ENCOUNTER — Other Ambulatory Visit: Payer: Self-pay

## 2020-04-28 ENCOUNTER — Ambulatory Visit (HOSPITAL_COMMUNITY)
Admission: EM | Admit: 2020-04-28 | Discharge: 2020-04-28 | Disposition: A | Discharge status: Home / Self Care | Payer: No Typology Code available for payment source | Source: Ambulatory Visit | Attending: Emergency Medicine | Admitting: Emergency Medicine

## 2020-04-28 ENCOUNTER — Encounter (HOSPITAL_COMMUNITY): Payer: Self-pay

## 2020-04-28 DIAGNOSIS — I10 Essential (primary) hypertension: Secondary | ICD-10-CM | POA: Insufficient documentation

## 2020-04-28 DIAGNOSIS — Z0441 Encounter for examination and observation following alleged adult rape: Secondary | ICD-10-CM | POA: Insufficient documentation

## 2020-04-28 DIAGNOSIS — Z0471 Encounter for examination and observation following alleged adult physical abuse: Secondary | ICD-10-CM | POA: Insufficient documentation

## 2020-04-28 DIAGNOSIS — F10920 Alcohol use, unspecified with intoxication, uncomplicated: Secondary | ICD-10-CM

## 2020-04-28 DIAGNOSIS — F1290 Cannabis use, unspecified, uncomplicated: Secondary | ICD-10-CM | POA: Insufficient documentation

## 2020-04-28 DIAGNOSIS — R32 Unspecified urinary incontinence: Secondary | ICD-10-CM | POA: Insufficient documentation

## 2020-04-28 DIAGNOSIS — F10120 Alcohol abuse with intoxication, uncomplicated: Secondary | ICD-10-CM | POA: Insufficient documentation

## 2020-04-28 DIAGNOSIS — Z79899 Other long term (current) drug therapy: Secondary | ICD-10-CM | POA: Insufficient documentation

## 2020-04-28 DIAGNOSIS — R7303 Prediabetes: Secondary | ICD-10-CM | POA: Insufficient documentation

## 2020-04-28 LAB — URINALYSIS, ROUTINE W REFLEX MICROSCOPIC
Bilirubin Urine: NEGATIVE
Glucose, UA: NEGATIVE mg/dL
Ketones, ur: NEGATIVE mg/dL
Nitrite: NEGATIVE
Protein, ur: NEGATIVE mg/dL
RBC / HPF: >50 RBC/hpf — ABNORMAL HIGH (ref 0–5)
Specific Gravity, Urine: 1.013 (ref 1.005–1.030)
pH: 6 (ref 5.0–8.0)

## 2020-04-28 LAB — CBC WITH DIFFERENTIAL/PLATELET
Abs Immature Granulocytes: 0.01 10*3/uL (ref 0.00–0.07)
Basophils Absolute: 0 10*3/uL (ref 0.0–0.1)
Basophils Relative: 1 %
Eosinophils Absolute: 0 10*3/uL (ref 0.0–0.5)
Eosinophils Relative: 0 %
HCT: 32.1 % — ABNORMAL LOW (ref 36.0–46.0)
Hemoglobin: 9.1 g/dL — ABNORMAL LOW (ref 12.0–15.0)
Immature Granulocytes: 0 %
Lymphocytes Relative: 36 %
Lymphs Abs: 2 10*3/uL (ref 0.7–4.0)
MCH: 19.1 pg — ABNORMAL LOW (ref 26.0–34.0)
MCHC: 28.3 g/dL — ABNORMAL LOW (ref 30.0–36.0)
MCV: 67.4 fL — ABNORMAL LOW (ref 80.0–100.0)
Monocytes Absolute: 0.4 10*3/uL (ref 0.1–1.0)
Monocytes Relative: 6 %
Neutro Abs: 3.2 10*3/uL (ref 1.7–7.7)
Neutrophils Relative %: 57 %
Platelets: 348 10*3/uL (ref 150–400)
RBC: 4.76 MIL/uL (ref 3.87–5.11)
RDW: 27.9 % — ABNORMAL HIGH (ref 11.5–15.5)
WBC: 5.6 10*3/uL (ref 4.0–10.5)
nRBC: 0 % (ref 0.0–0.2)

## 2020-04-28 LAB — COMPREHENSIVE METABOLIC PANEL
ALT: 18 U/L (ref 0–44)
AST: 22 U/L (ref 15–41)
Albumin: 4.5 g/dL (ref 3.5–5.0)
Alkaline Phosphatase: 36 U/L — ABNORMAL LOW (ref 38–126)
Anion gap: 8 (ref 5–15)
BUN: 8 mg/dL (ref 6–20)
CO2: 24 mmol/L (ref 22–32)
Calcium: 8.6 mg/dL — ABNORMAL LOW (ref 8.9–10.3)
Chloride: 110 mmol/L (ref 98–111)
Creatinine, Ser: 0.67 mg/dL (ref 0.44–1.00)
GFR calc Af Amer: >60 mL/min (ref >60)
GFR calc non Af Amer: >60 mL/min (ref >60)
Glucose, Bld: 110 mg/dL — ABNORMAL HIGH (ref 70–99)
Potassium: 3.6 mmol/L (ref 3.5–5.1)
Sodium: 142 mmol/L (ref 135–145)
Total Bilirubin: 0.4 mg/dL (ref 0.3–1.2)
Total Protein: 7.9 g/dL (ref 6.5–8.1)

## 2020-04-28 LAB — RAPID URINE DRUG SCREEN, HOSP PERFORMED
Amphetamines: NOT DETECTED
Barbiturates: NOT DETECTED
Benzodiazepines: NOT DETECTED
Cocaine: NOT DETECTED
Opiates: NOT DETECTED
Tetrahydrocannabinol: POSITIVE — AB

## 2020-04-28 LAB — RAPID HIV SCREEN (HIV 1/2 AB+AG)
HIV 1/2 Antibodies: NONREACTIVE
HIV-1 P24 Antigen - HIV24: NONREACTIVE

## 2020-04-28 LAB — I-STAT BETA HCG BLOOD, ED (MC, WL, AP ONLY): I-stat hCG, quantitative: <5 m[IU]/mL (ref <5)

## 2020-04-28 LAB — ETHANOL: Alcohol, Ethyl (B): 123 mg/dL — ABNORMAL HIGH (ref <10)

## 2020-04-28 LAB — HEPATITIS C ANTIBODY: HCV Ab: NONREACTIVE

## 2020-04-28 MED ORDER — METRONIDAZOLE 500 MG PO TABS
2000.0000 mg | ORAL_TABLET | Freq: Once | ORAL | Status: AC
  Administered 2020-04-28: 2000 mg via ORAL
  Filled 2020-04-28: qty 4

## 2020-04-28 MED ORDER — ELVITEG-COBIC-EMTRICIT-TENOFAF 150-150-200-10 MG PO TABS
1.0000 | ORAL_TABLET | Freq: Every day | ORAL | 0 refills | Status: DC
Start: 2020-04-28 — End: 2020-08-14

## 2020-04-28 MED ORDER — ULIPRISTAL ACETATE 30 MG PO TABS
30.0000 mg | ORAL_TABLET | Freq: Once | ORAL | Status: AC
  Administered 2020-04-28: 30 mg via ORAL
  Filled 2020-04-28: qty 1

## 2020-04-28 MED ORDER — ONDANSETRON HCL 4 MG/2ML IJ SOLN: 4.0000 mg | Freq: Once | INTRAMUSCULAR | Status: DC

## 2020-04-28 MED ORDER — ELVITEG-COBIC-EMTRICIT-TENOFAF 150-150-200-10 MG PREPACK
5.0000 | ORAL_TABLET | Freq: Once | ORAL | Status: AC
  Administered 2020-04-28: 5 via ORAL
  Filled 2020-04-28: qty 1

## 2020-04-28 MED ORDER — ONDANSETRON 4 MG PO TBDP: 4.0000 mg | ORAL_TABLET | Freq: Three times a day (TID) | ORAL | 0 refills | Status: DC | PRN

## 2020-04-28 MED ORDER — ELVITEG-COBIC-EMTRICIT-TENOFAF 150-150-200-10 MG PO TABS: 1.0000 | ORAL_TABLET | Freq: Every day | ORAL | 0 refills | Status: DC

## 2020-04-28 MED ORDER — AZITHROMYCIN 250 MG PO TABS
1000.0000 mg | ORAL_TABLET | Freq: Once | ORAL | Status: AC
  Administered 2020-04-28: 1000 mg via ORAL
  Filled 2020-04-28: qty 4
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
## 2020-04-28 NOTE — Discharge Instructions (Signed)
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
## 2020-04-29 LAB — HEPATITIS B SURFACE ANTIGEN: Hepatitis B Surface Ag: NONREACTIVE

## 2020-04-29 LAB — RPR: RPR Ser Ql: NONREACTIVE

## 2020-04-30 MED FILL — GENVOYA TABLET: 150-150-200 | 30 days supply | Qty: 30 | Fill #0
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
## 2020-05-09 ENCOUNTER — Other Ambulatory Visit: Payer: Self-pay

## 2020-05-09 ENCOUNTER — Encounter: Payer: Self-pay | Admitting: Internal Medicine

## 2020-05-09 ENCOUNTER — Ambulatory Visit: Payer: Self-pay | Admitting: Internal Medicine

## 2020-05-09 VITALS — BP 121/69 | HR 92 | Temp 98.7°F | Ht 67.0 in | Wt 221.5 lb

## 2020-05-09 DIAGNOSIS — D500 Iron deficiency anemia secondary to blood loss (chronic): Secondary | ICD-10-CM

## 2020-05-09 DIAGNOSIS — T7421XA Adult sexual abuse, confirmed, initial encounter: Secondary | ICD-10-CM

## 2020-05-09 DIAGNOSIS — R87613 High grade squamous intraepithelial lesion on cytologic smear of cervix (HGSIL): Secondary | ICD-10-CM | POA: Insufficient documentation

## 2020-05-09 DIAGNOSIS — N920 Excessive and frequent menstruation with regular cycle: Secondary | ICD-10-CM

## 2020-05-09 DIAGNOSIS — F32A Depression, unspecified: Secondary | ICD-10-CM | POA: Insufficient documentation

## 2020-05-09 DIAGNOSIS — F329 Major depressive disorder, single episode, unspecified: Secondary | ICD-10-CM

## 2020-05-09 HISTORY — DX: Depression, unspecified: F32.A

## 2020-05-09 MED ORDER — FERROUS SULFATE 75 (15 FE) MG/ML PO SOLN: 15.0000 mg | ORAL | 0 refills | Status: DC

## 2020-05-09 MED FILL — FERROUS SULF 15 MG IRON/ML: 75 (15 FE) | 100 days supply | Qty: 50 | Fill #0

## 2020-05-09 NOTE — Assessment & Plan Note (Signed)
Patient found to have HSIL on pap smear for which she was referred to gynecology for possible colposcopy. However, patient did not keep her appointment. She notes that she is now having spotting between her menstrual cycles and notes that her menstrual cycles have become irregular. Discussed with patient regarding recommendation to f/u with gynecology. She expresses understanding.   Plan: F/u with gynecology Referral to gynecology placed again
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
## 2020-05-09 NOTE — Assessment & Plan Note (Signed)
Secondary to ongoing menorrhagia. Patient has been noncompliant with her iron supplementation.   Plan: - Encouraged to take liquid iron as previously advised
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
## 2020-05-14 NOTE — Progress Notes (Signed)
Internal Medicine Clinic Attending ° °Case discussed with Dr. Aslam  At the time of the visit.  We reviewed the resident’s history and exam and pertinent patient test results.  I agree with the assessment, diagnosis, and plan of care documented in the resident’s note.  °

## 2020-05-16 ENCOUNTER — Telehealth: Payer: Self-pay | Admitting: Licensed Clinical Social Worker

## 2020-05-16 NOTE — Telephone Encounter (Signed)
Patient was called to discuss a referral from her doctor. Patient agreed, and will be added to my schedule for 8/24 @ 9:00 via an in person visit.

## 2020-05-28 ENCOUNTER — Ambulatory Visit: Payer: Medicaid Other

## 2020-05-29 ENCOUNTER — Encounter: Payer: Self-pay | Admitting: Internal Medicine

## 2020-06-19 ENCOUNTER — Ambulatory Visit: Payer: Medicaid Other | Admitting: Licensed Clinical Social Worker

## 2020-06-26 NOTE — Addendum Note (Signed)
Addended by: Neomia Dear on: 06/26/2020 05:54 PM   Modules accepted: Orders

## 2020-06-27 NOTE — Addendum Note (Signed)
Addended by: Neomia Dear on: 06/27/2020 06:25 PM   Modules accepted: Orders

## 2020-07-28 ENCOUNTER — Encounter (HOSPITAL_COMMUNITY): Payer: Self-pay | Admitting: Emergency Medicine

## 2020-07-28 ENCOUNTER — Emergency Department (HOSPITAL_COMMUNITY): Payer: Self-pay

## 2020-07-28 ENCOUNTER — Other Ambulatory Visit: Payer: Self-pay

## 2020-07-28 ENCOUNTER — Emergency Department (HOSPITAL_COMMUNITY)
Admission: EM | Admit: 2020-07-28 | Discharge: 2020-07-28 | Disposition: A | Discharge status: Home / Self Care | Payer: Self-pay | Attending: Emergency Medicine | Admitting: Emergency Medicine

## 2020-07-28 DIAGNOSIS — A599 Trichomoniasis, unspecified: Secondary | ICD-10-CM | POA: Insufficient documentation

## 2020-07-28 DIAGNOSIS — I10 Essential (primary) hypertension: Secondary | ICD-10-CM | POA: Insufficient documentation

## 2020-07-28 DIAGNOSIS — N739 Female pelvic inflammatory disease, unspecified: Secondary | ICD-10-CM | POA: Insufficient documentation

## 2020-07-28 DIAGNOSIS — R52 Pain, unspecified: Secondary | ICD-10-CM

## 2020-07-28 DIAGNOSIS — N730 Acute parametritis and pelvic cellulitis: Secondary | ICD-10-CM

## 2020-07-28 LAB — COMPREHENSIVE METABOLIC PANEL
ALT: 14 U/L (ref 0–44)
AST: 19 U/L (ref 15–41)
Albumin: 4.2 g/dL (ref 3.5–5.0)
Alkaline Phosphatase: 38 U/L (ref 38–126)
Anion gap: 11 (ref 5–15)
BUN: 7 mg/dL (ref 6–20)
CO2: 26 mmol/L (ref 22–32)
Calcium: 9.4 mg/dL (ref 8.9–10.3)
Chloride: 103 mmol/L (ref 98–111)
Creatinine, Ser: 0.81 mg/dL (ref 0.44–1.00)
GFR calc Af Amer: >60 mL/min (ref >60)
GFR calc non Af Amer: >60 mL/min (ref >60)
Glucose, Bld: 122 mg/dL — ABNORMAL HIGH (ref 70–99)
Potassium: 3.3 mmol/L — ABNORMAL LOW (ref 3.5–5.1)
Sodium: 140 mmol/L (ref 135–145)
Total Bilirubin: 0.6 mg/dL (ref 0.3–1.2)
Total Protein: 7.5 g/dL (ref 6.5–8.1)

## 2020-07-28 LAB — URINALYSIS, ROUTINE W REFLEX MICROSCOPIC
Bilirubin Urine: NEGATIVE
Glucose, UA: NEGATIVE mg/dL
Hgb urine dipstick: NEGATIVE
Ketones, ur: NEGATIVE mg/dL
Nitrite: NEGATIVE
Protein, ur: NEGATIVE mg/dL
Specific Gravity, Urine: >1.046 — ABNORMAL HIGH (ref 1.005–1.030)
pH: 5 (ref 5.0–8.0)

## 2020-07-28 LAB — CBC
HCT: 27.5 % — ABNORMAL LOW (ref 36.0–46.0)
Hemoglobin: 7.8 g/dL — ABNORMAL LOW (ref 12.0–15.0)
MCH: 19.3 pg — ABNORMAL LOW (ref 26.0–34.0)
MCHC: 28.4 g/dL — ABNORMAL LOW (ref 30.0–36.0)
MCV: 68.1 fL — ABNORMAL LOW (ref 80.0–100.0)
Platelets: 291 10*3/uL (ref 150–400)
RBC: 4.04 MIL/uL (ref 3.87–5.11)
RDW: 26.7 % — ABNORMAL HIGH (ref 11.5–15.5)
WBC: 7.5 10*3/uL (ref 4.0–10.5)
nRBC: 0 % (ref 0.0–0.2)

## 2020-07-28 LAB — I-STAT BETA HCG BLOOD, ED (MC, WL, AP ONLY): I-stat hCG, quantitative: <5 m[IU]/mL (ref <5)

## 2020-07-28 LAB — WET PREP, GENITAL
Clue Cells Wet Prep HPF POC: NONE SEEN
Sperm: NONE SEEN
Yeast Wet Prep HPF POC: NONE SEEN

## 2020-07-28 LAB — LIPASE, BLOOD: Lipase: 31 U/L (ref 11–51)

## 2020-07-28 MED ORDER — ONDANSETRON HCL 4 MG/2ML IJ SOLN
4.0000 mg | Freq: Once | INTRAMUSCULAR | Status: AC
  Administered 2020-07-28: 4 mg via INTRAVENOUS
  Filled 2020-07-28: qty 2

## 2020-07-28 MED ORDER — METRONIDAZOLE 500 MG PO TABS: 500.0000 mg | ORAL_TABLET | Freq: Two times a day (BID) | ORAL | 0 refills | Status: DC

## 2020-07-28 MED ORDER — SODIUM CHLORIDE 0.9 % IV SOLN
500.0000 mg | Freq: Once | INTRAVENOUS | Status: AC
  Administered 2020-07-28: 500 mg via INTRAVENOUS
  Filled 2020-07-28: qty 500

## 2020-07-28 MED ORDER — FENTANYL CITRATE (PF) 100 MCG/2ML IJ SOLN
50.0000 ug | Freq: Once | INTRAMUSCULAR | Status: AC
  Administered 2020-07-28: 50 ug via INTRAVENOUS
  Filled 2020-07-28: qty 2

## 2020-07-28 MED ORDER — OXYCODONE HCL 5 MG PO TABS: 5.0000 mg | ORAL_TABLET | Freq: Four times a day (QID) | ORAL | 0 refills | Status: DC | PRN

## 2020-07-28 MED ORDER — METOCLOPRAMIDE HCL 5 MG/ML IJ SOLN
10.0000 mg | Freq: Once | INTRAMUSCULAR | Status: AC
  Administered 2020-07-28: 10 mg via INTRAVENOUS
  Filled 2020-07-28: qty 2

## 2020-07-28 MED ORDER — ONDANSETRON HCL 4 MG PO TABS: 4.0000 mg | ORAL_TABLET | Freq: Four times a day (QID) | ORAL | 0 refills | Status: AC

## 2020-07-28 MED ORDER — SODIUM CHLORIDE 0.9 % IV BOLUS
1000.0000 mL | Freq: Once | INTRAVENOUS | Status: AC
  Administered 2020-07-28: 1000 mL via INTRAVENOUS

## 2020-07-28 MED ORDER — IOHEXOL 300 MG/ML  SOLN
100.0000 mL | Freq: Once | INTRAMUSCULAR | Status: AC | PRN
  Administered 2020-07-28: 100 mL via INTRAVENOUS

## 2020-07-28 MED ORDER — AZITHROMYCIN 250 MG PO TABS: 250.0000 mg | ORAL_TABLET | Freq: Every day | ORAL | 0 refills | Status: DC

## 2020-07-28 MED ORDER — METRONIDAZOLE 500 MG PO TABS
500.0000 mg | ORAL_TABLET | Freq: Once | ORAL | Status: AC
  Administered 2020-07-28: 500 mg via ORAL
  Filled 2020-07-28: qty 1
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
## 2020-07-28 NOTE — ED Triage Notes (Signed)
Pt to triage via [facility] from home.  Reports R sided abd pain that started after starting her period on 9/19.  Nausea and vomiting yesterday that has resolved.  Denies diarrhea.

## 2020-07-28 NOTE — Discharge Instructions (Signed)
Take antibiotics as prescribed.  Take Tylenol and Motrin for pain as needed.  Take Roxicodone for any breakthrough pain.  Do not mix this with any other alcohol or drugs as this is a narcotic pain medicine and can make you very sleepy and you can stop breathing.  Do not drive while taking this pain medication or any other dangerous activities.  Please let any sex partners know that they need to be treated for STDs including Trichomonas.  Please return to the emergency department if you have worsening pain, nausea, vomiting, fever.

## 2020-07-29 MED ORDER — METRONIDAZOLE 500 MG PO TABS: 500.0000 mg | ORAL_TABLET | Freq: Two times a day (BID) | ORAL | 0 refills | Status: DC

## 2020-07-29 MED ORDER — OXYCODONE HCL 5 MG PO TABS: 5.0000 mg | ORAL_TABLET | Freq: Four times a day (QID) | ORAL | 0 refills | Status: DC | PRN

## 2020-07-29 MED ORDER — AZITHROMYCIN 250 MG PO TABS: 250.0000 mg | ORAL_TABLET | Freq: Every day | ORAL | 0 refills | Status: DC

## 2020-07-30 LAB — URINE CULTURE: Culture: <10000 — AB

## 2020-07-30 LAB — GC/CHLAMYDIA PROBE AMP (~~LOC~~) NOT AT ARMC
Chlamydia: POSITIVE — AB
Comment: NEGATIVE
Comment: NORMAL
Neisseria Gonorrhea: POSITIVE — AB

## 2020-08-02 ENCOUNTER — Encounter (HOSPITAL_COMMUNITY): Payer: Self-pay | Admitting: Emergency Medicine

## 2020-08-02 ENCOUNTER — Other Ambulatory Visit: Payer: Self-pay

## 2020-08-02 ENCOUNTER — Inpatient Hospital Stay (HOSPITAL_COMMUNITY)
Admission: AD | Admit: 2020-08-02 | Discharge: 2020-08-02 | Disposition: A | Discharge status: Home / Self Care | Payer: Medicaid Other | Attending: Obstetrics & Gynecology | Admitting: Obstetrics & Gynecology

## 2020-08-02 DIAGNOSIS — Z8619 Personal history of other infectious and parasitic diseases: Secondary | ICD-10-CM | POA: Insufficient documentation

## 2020-08-02 DIAGNOSIS — Z880 Allergy status to penicillin: Secondary | ICD-10-CM | POA: Insufficient documentation

## 2020-08-02 DIAGNOSIS — Z79899 Other long term (current) drug therapy: Secondary | ICD-10-CM | POA: Insufficient documentation

## 2020-08-02 DIAGNOSIS — N739 Female pelvic inflammatory disease, unspecified: Secondary | ICD-10-CM | POA: Insufficient documentation

## 2020-08-02 DIAGNOSIS — A549 Gonococcal infection, unspecified: Secondary | ICD-10-CM

## 2020-08-02 DIAGNOSIS — A749 Chlamydial infection, unspecified: Secondary | ICD-10-CM

## 2020-08-02 DIAGNOSIS — A599 Trichomoniasis, unspecified: Secondary | ICD-10-CM

## 2020-08-02 DIAGNOSIS — R7303 Prediabetes: Secondary | ICD-10-CM | POA: Insufficient documentation

## 2020-08-02 LAB — HIV ANTIBODY (ROUTINE TESTING W REFLEX): HIV Screen 4th Generation wRfx: NONREACTIVE

## 2020-08-02 LAB — HEPATITIS B SURFACE ANTIGEN: Hepatitis B Surface Ag: NONREACTIVE

## 2020-08-02 LAB — HEPATITIS C ANTIBODY: HCV Ab: NONREACTIVE

## 2020-08-02 MED ORDER — AZITHROMYCIN 250 MG PO TABS: 1000.0000 mg | ORAL_TABLET | ORAL | 0 refills | Status: AC

## 2020-08-02 MED ORDER — GENTAMICIN SULFATE 40 MG/ML IJ SOLN
240.0000 mg | Freq: Once | INTRAMUSCULAR | Status: AC
  Administered 2020-08-02: 240 mg via INTRAMUSCULAR
  Filled 2020-08-02: qty 6

## 2020-08-02 MED ORDER — METRONIDAZOLE 500 MG PO TABS: 2000.0000 mg | ORAL_TABLET | Freq: Once | ORAL | 0 refills | Status: AC

## 2020-08-02 NOTE — Progress Notes (Signed)
[name] CNM in Triage to discuss d/c plan and medications. Written and verbal d/c instructions given and understanding voiced.

## 2020-08-02 NOTE — MAU Note (Signed)
[name] CNM in Triage to see pt. POC discussed
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
## 2020-08-02 NOTE — MAU Note (Signed)
Here for STI treatment. Denies any pain or concerns.

## 2020-08-02 NOTE — Discharge Instructions (Signed)
Gonorrhea Gonorrhea is a sexually transmitted disease (STD) that can affect both men and women. If left untreated, this infection can:  Damage the female or female organs.  Cause women and men to be unable to have children (be sterile).  Harm a fetus if an infected woman is pregnant. It is important to get treatment for gonorrhea as soon as possible. It is also necessary for all of your sexual partners to be tested for the infection. What are the causes? This condition is caused by bacteria called Neisseria gonorrhoeae. The infection is spread from person to person through sexual contact, including oral, anal, and vaginal sex. A newborn can contract the infection from his or her mother during birth. What increases the risk? The following factors may make you more likely to develop this condition:  Being a woman who is younger than 22 years of age and who is sexually active.  Being a woman 42 years of age or older who has: ? A new sex partner. ? More than one sex partner. ? A sex partner who has an STD.  Being a man who has: ? A new sex partner. ? More than one sex partner. ? A sex partner who has an STD.  Using condoms inconsistently.  Currently having, or having previously had, an STD.  Exchanging sex for money or drugs. What are the signs or symptoms? Some people do not have any symptoms. If you do have symptoms, they may be different for females and males. For females  Pain in the lower abdomen.  Abnormal vaginal discharge. The discharge may be cloudy, thick, or yellow-green in color.  Bleeding between periods.  Painful sex.  Burning or itching in and around the vagina.  Pain or burning when urinating.  Irritation, pain, bleeding, or discharge from the rectum. This may occur if the infection was spread by anal sex.  Sore throat or swollen lymph nodes in the neck. This may occur if the infection was spread by oral sex. For males  Abnormal discharge from the penis.  This discharge may be cloudy, thick, or yellow-green in color.  Pain or burning during urination.  Pain or swelling in the testicles.  Irritation, pain, bleeding, or discharge from the rectum. This may occur if the infection was spread by anal sex.  Sore throat, fever, or swollen lymph nodes in the neck. This may occur if the infection was spread by oral sex. How is this diagnosed? This condition is diagnosed based on:  A physical exam.  A sample of discharge that is examined under a microscope to look for the bacteria. The discharge may be taken from the urethra, cervix, throat, or rectum.  Urine tests. Not all of test results will be available during your visit. How is this treated? This condition is treated with antibiotic medicines. It is important for treatment to begin as soon as possible. Early treatment may prevent some problems from developing. Do not have sex during treatment. Avoid all types of sexual activity for 7 days after treatment is complete and until any sex partners have been treated. Follow these instructions at home:  Take over-the-counter and prescription medicines only as told by your health care provider.  Take your antibiotic medicine as told by your health care provider. Do not stop taking the antibiotic even if you start to feel better.  Do not have sex until at least 7 days after you and your partner(s) have finished treatment and your health care provider says it is okay.  It is your responsibility to get your test results. Ask your health care provider, or the department performing the test, when your results will be ready.  If you test positive for gonorrhea, inform your recent sexual partners. This includes any oral, anal, or vaginal sex partners. They need to be checked for gonorrhea even if they do not have symptoms. They may need treatment, even if they test negative for gonorrhea.  Keep all follow-up visits as told by your health care provider.  This is important. How is this prevented?   Use latex condoms correctly every time you have sexual intercourse.  Ask if your sexual partner has been tested for STDs and had negative results.  Avoid having multiple sexual partners. Contact a health care provider if:  You develop a bad reaction to the medicine you were prescribed. This may include: ? A rash. ? Nausea. ? Vomiting. ? Diarrhea.  Your symptoms do not get better after a few days of taking antibiotics.  Your symptoms get worse.  You develop new symptoms.  Your pain gets worse.  You have a fever.  You develop pain, itching, or discharge around the eyes. Get help right away if:  You feel dizzy or faint.  You have trouble breathing or have shortness of breath.  You develop an irregular heartbeat.  You have severe abdominal pain with or without shoulder pain.  You develop any bumps or sores (lesions) on your skin.  You develop warmth, redness, pain, or swelling around your joints, such as the knee. Summary  Gonorrhea is an STD that can affect both men and women.  This condition is caused by bacteria called Neisseria gonorrhoeae. The infection is spread from person to person, usually through sexual contact, including oral, anal, and vaginal sex.  Symptoms vary between males and females. Generally, they include abnormal discharge and burning during urination. Women may also experience painful sex, itching around the vagina, and bleeding between menstrual periods. Men may also experience swelling of the testicles.  This condition is treated with antibiotic medicines. Do not have sex until at least 7 days after completing antibiotic treatment.  If left untreated, gonorrhea can have serious side effects and complications. This information is not intended to replace advice given to you by your health care provider. Make sure you discuss any questions you have with your health care provider. Document Revised:  03/08/2019 Document Reviewed: 09/12/2016 Elsevier Patient Education  2020 [employer]   Chlamydia, Female Chlamydia is an STD (sexually transmitted disease). It is a bacterial infection that spreads (is contagious) through sexual contact. Chlamydia can occur in different areas of the body, including:  The tube that moves urine from the bladder out of the body (urethra).  The lower part of the uterus (cervix).  The throat.  The rectum. This condition is not difficult to treat. However, if left untreated, chlamydia can lead to more serious health problems, including pelvic inflammatory disorder (PID). PID can increase your risk of not being able to have children (sterility). Also, if chlamydia is left untreated and you are pregnant or become pregnant, there is a chance that your baby can become infected during delivery. This may cause serious health problems for the baby. What are the causes? Chlamydia is caused by the bacteria Chlamydia trachomatis. It is passed from an infected partner during sexual activity. Chlamydia can spread through contact with the genitals, mouth, or rectum. What are the signs or symptoms? In some cases, there may not be any symptoms for  this condition (asymptomatic), especially early in the infection. If symptoms develop, they may include:  Burning with urination.  Frequent urination.  Vaginal discharge.  Redness, soreness, and swelling (inflammation) of the rectum.  Bleeding or discharge from the rectum.  Abdominal pain.  Pain during sexual intercourse.  Bleeding between menstrual periods.  Itching, burning, or redness in the eyes, or discharge from the eyes. How is this diagnosed? This condition may be diagnosed with:  Urine tests.  Swab tests. Depending on your symptoms, your health care provider may use a cotton swab to collect discharge from your vagina or rectum to test for the bacteria.  A pelvic exam. How is this treated? This  condition is treated with antibiotic medicines. If you are pregnant, certain types of antibiotics will need to be avoided. Follow these instructions at home: Medicines  Take over-the-counter and prescription medicines only as told by your health care provider.  Take your antibiotic medicine as told by your health care provider. Do not stop taking the antibiotic even if you start to feel better. Sexual activity  Tell sexual partners about your infection. This includes any oral, anal, or vaginal sex partners you have had within 60 days of when your symptoms started. Sexual partners should also be treated, even if they have no signs of the disease.  Do not have sex until you and your sexual partners have completed treatment and your health care provider says it is okay. If your health care provider prescribed you a single dose treatment, wait 7 days after taking the treatment before having sex. General instructions  It is your responsibility to get your test results. Ask your health care provider, or the department performing the test, when your results will be ready.  Get plenty of rest.  Eat a healthy, well-balanced diet.  Drink enough fluids to keep your urine clear or pale yellow.  Keep all follow-up visits as told by your health care provider. This is important. You may need to be tested for infection again 3 months after treatment. How is this prevented? The only sure way to prevent chlamydia is to avoid having sex. However, you can lower your risk by:  Using latex condoms correctly every time you have sex.  Not having multiple sexual partners.  Asking if your sexual partner has been tested for STIs and had negative results. Contact a health care provider if:  You develop new symptoms or your symptoms do not get better after completing treatment.  You have a fever or chills.  You have pain during sexual intercourse. Get help right away if:  Your pain gets worse and does not  get better with medicine.  You develop flu-like symptoms, such as night sweats, sore throat, or muscle aches.  You experience nausea or vomiting.  You have difficulty swallowing.  You have bleeding between periods or after sex.  You have irregular menstrual periods.  You have abdominal or lower back pain that does not get better with medicine.  You feel weak or dizzy, or you faint.  You are pregnant and you develop symptoms of chlamydia. Summary  Chlamydia is an STD (sexually transmitted disease). It is a bacterial infection that spreads (is contagious) through sexual contact.  This condition is not difficult to treat, however. If left untreated, chlamydia can lead to more serious health problems, including pelvic inflammatory disease (PID).  In some cases, there may not be any symptoms for this condition (asymptomatic).  This condition is treated with antibiotic medicines.  Using latex condoms correctly every time you have sex can help prevent chlamydia. This information is not intended to replace advice given to you by your health care provider. Make sure you discuss any questions you have with your health care provider. Document Revised: 04/06/2018 Document Reviewed: 09/29/2016 Elsevier Patient Education  2020 [employer].  Trichomonas Test Why am I having this test? The trichomonas test is done to diagnose trichomoniasis, a sexually-transmitted infection (STI) caused by an organism called Trichomonas. You may have this test as a part of a routine screening for STIs or if you have symptoms of trichomoniasis. What kind of sample is taken? To perform the test, your health care provider will either ask you to provide a urine sample or take a sample of discharge. The sample will be taken from the vagina or cervix in women and from the urethra in men. How are the results reported? Your test results will be reported as either positive or negative. It is up to you to get your test  results. Ask your health care provider, or the department that is doing the test, when your results will be ready. What do the results mean? Negative test result A negative result means that you do not have trichomoniasis. Follow your health care provider's directions about any follow-up testing. Positive test result A positive result means that you have an active infection that needs to be treated with antibiotic medicine. All your current sexual partners must also be treated. If they are not, you will likely get reinfected. If your test result is positive, your health care provider will start you on medicine and may advise you:  Not to have sex until your infection has cleared up.  To use a latex condom properly every time you have sex.  To limit the number of sexual partners you have. The more partners you have, the greater your risk of contracting trichomoniasis or another STI.  To tell all sexual partners about your infection so that they can also get tested and treated. This will prevent reinfection. Talk with your health care provider to discuss your results, treatment options, and if necessary, the need for more tests. Talk with your health care provider if you have any questions about your results. This information is not intended to replace advice given to you by your health care provider. Make sure you discuss any questions you have with your health care provider. Document Revised: 09/25/2017 Document Reviewed: 12/09/2016 Elsevier Patient Education  2020 [employer].  Safe Sex Practicing safe sex means taking steps before and during sex to reduce your risk of:  Getting an STI (sexually transmitted infection).  Giving your partner an STI.  Unwanted or unplanned pregnancy. How can I practice safe sex?     Ways you can practice safe sex  Limit your sexual partners to only one partner who is having sex with only you.  Avoid using alcohol and drugs before having sex.  Alcohol and drugs can affect your judgment.  Before having sex with a new partner: ? Talk to your partner about past partners, past STIs, and drug use. ? Get screened for STIs and discuss the results with your partner. Ask your partner to get screened, too.  Check your body regularly for sores, blisters, rashes, or unusual discharge. If you notice any of these problems, visit your health care provider.  Avoid sexual contact if you have symptoms of an infection or you are being treated for an STI.  While having sex,  use a condom. Make sure to: ? Use a condom every time you have vaginal, oral, or anal sex. Both females and males should wear condoms during oral sex. ? Keep condoms in place from the beginning to the end of sexual activity. ? Use a latex condom, if possible. Latex condoms offer the best protection. ? Use only water-based lubricants with a condom. Using petroleum-based lubricants or oils will weaken the condom and increase the chance that it will break. Ways your health care provider can help you practice safe sex  See your health care provider for regular screenings, exams, and tests for STIs.  Talk with your health care provider about what kind of birth control (contraception) is best for you.  Get vaccinated against hepatitis B and human papillomavirus (HPV).  If you are at risk of being infected with HIV (human immunodeficiency virus), talk with your health care provider about taking a prescription medicine to prevent HIV infection. You are at risk for HIV if you: ? Are a man who has sex with other men. ? Are sexually active with more than one partner. ? Take drugs by injection. ? Have a sex partner who has HIV. ? Have unprotected sex. ? Have sex with someone who has sex with both men and women. ? Have had an STI. Follow these instructions at home:  Take over-the-counter and prescription medicines as told by your health care provider.  Keep all follow-up visits as  told by your health care provider. This is important. Where to find more information  Centers for Disease Control and Prevention: [employer]be  Planned Parenthood: https://www.plannedparenthood.org/  Office on Women's Health: [employer] Summary  Practicing safe sex means taking steps before and during sex to reduce your risk of STIs, giving your partner STIs, and having an unwanted or unplanned pregnancy.  Before having sex with a new partner, talk to your partner about past partners, past STIs, and drug use.  Use a condom every time you have vaginal, oral, or anal sex. Both females and males should wear condoms during oral sex.  Check your body regularly for sores, blisters, rashes, or unusual discharge. If you notice any of these problems, visit your health care provider.  See your health care provider for regular screenings, exams, and tests for STIs. This information is not intended to replace advice given to you by your health care provider. Make sure you discuss any questions you have with your health care provider. Document Revised: 02/04/2019 Document Reviewed: 07/26/2018 Elsevier Patient Education  2020 [employer].

## 2020-08-02 NOTE — ED Triage Notes (Signed)
Patient arrives to ED for follow up for treatment from her positive test for chlamydia and gonorrhea back on 10/2.

## 2020-08-03 LAB — RPR: RPR Ser Ql: NONREACTIVE

## 2020-08-03 MED FILL — METRONIDAZOLE 500 MG TABS: 500 | 1 days supply | Qty: 4 | Fill #0

## 2020-08-13 ENCOUNTER — Observation Stay (HOSPITAL_COMMUNITY)
Admission: EM | Admit: 2020-08-13 | Discharge: 2020-08-14 | Disposition: A | Discharge status: Home / Self Care | Payer: Medicaid Other | Attending: Internal Medicine | Admitting: Internal Medicine

## 2020-08-13 ENCOUNTER — Encounter (HOSPITAL_COMMUNITY): Payer: Self-pay | Admitting: Emergency Medicine

## 2020-08-13 ENCOUNTER — Other Ambulatory Visit: Payer: Self-pay

## 2020-08-13 DIAGNOSIS — N920 Excessive and frequent menstruation with regular cycle: Secondary | ICD-10-CM | POA: Diagnosis present

## 2020-08-13 DIAGNOSIS — D500 Iron deficiency anemia secondary to blood loss (chronic): Principal | ICD-10-CM | POA: Insufficient documentation

## 2020-08-13 DIAGNOSIS — D509 Iron deficiency anemia, unspecified: Secondary | ICD-10-CM | POA: Diagnosis present

## 2020-08-13 DIAGNOSIS — Z79899 Other long term (current) drug therapy: Secondary | ICD-10-CM | POA: Insufficient documentation

## 2020-08-13 DIAGNOSIS — Z20822 Contact with and (suspected) exposure to covid-19: Secondary | ICD-10-CM | POA: Insufficient documentation

## 2020-08-13 DIAGNOSIS — D61818 Other pancytopenia: Secondary | ICD-10-CM

## 2020-08-13 DIAGNOSIS — R7303 Prediabetes: Secondary | ICD-10-CM | POA: Insufficient documentation

## 2020-08-13 DIAGNOSIS — D696 Thrombocytopenia, unspecified: Secondary | ICD-10-CM | POA: Diagnosis present

## 2020-08-13 DIAGNOSIS — D649 Anemia, unspecified: Secondary | ICD-10-CM | POA: Diagnosis present

## 2020-08-13 DIAGNOSIS — I10 Essential (primary) hypertension: Secondary | ICD-10-CM | POA: Insufficient documentation

## 2020-08-13 LAB — COMPREHENSIVE METABOLIC PANEL
ALT: 18 U/L (ref 0–44)
AST: 23 U/L (ref 15–41)
Albumin: 3.9 g/dL (ref 3.5–5.0)
Alkaline Phosphatase: 35 U/L — ABNORMAL LOW (ref 38–126)
Anion gap: 9 (ref 5–15)
BUN: 11 mg/dL (ref 6–20)
CO2: 23 mmol/L (ref 22–32)
Calcium: 8.9 mg/dL (ref 8.9–10.3)
Chloride: 106 mmol/L (ref 98–111)
Creatinine, Ser: 0.76 mg/dL (ref 0.44–1.00)
GFR, Estimated: >60 mL/min (ref >60)
Glucose, Bld: 105 mg/dL — ABNORMAL HIGH (ref 70–99)
Potassium: 3.3 mmol/L — ABNORMAL LOW (ref 3.5–5.1)
Sodium: 138 mmol/L (ref 135–145)
Total Bilirubin: 0.7 mg/dL (ref 0.3–1.2)
Total Protein: 6.9 g/dL (ref 6.5–8.1)

## 2020-08-13 LAB — CBC WITH DIFFERENTIAL/PLATELET
Abs Immature Granulocytes: 0.01 10*3/uL (ref 0.00–0.07)
Basophils Absolute: 0 10*3/uL (ref 0.0–0.1)
Basophils Relative: 1 %
Eosinophils Absolute: 0.1 10*3/uL (ref 0.0–0.5)
Eosinophils Relative: 2 %
HCT: 24.7 % — ABNORMAL LOW (ref 36.0–46.0)
Hemoglobin: 6.8 g/dL — CL (ref 12.0–15.0)
Immature Granulocytes: 0 %
Lymphocytes Relative: 48 %
Lymphs Abs: 1.8 10*3/uL (ref 0.7–4.0)
MCH: 18.7 pg — ABNORMAL LOW (ref 26.0–34.0)
MCHC: 27.5 g/dL — ABNORMAL LOW (ref 30.0–36.0)
MCV: 67.9 fL — ABNORMAL LOW (ref 80.0–100.0)
Monocytes Absolute: 0.3 10*3/uL (ref 0.1–1.0)
Monocytes Relative: 9 %
Neutro Abs: 1.5 10*3/uL — ABNORMAL LOW (ref 1.7–7.7)
Neutrophils Relative %: 40 %
Platelets: 63 10*3/uL — ABNORMAL LOW (ref 150–400)
RBC: 3.64 MIL/uL — ABNORMAL LOW (ref 3.87–5.11)
RDW: 25.4 % — ABNORMAL HIGH (ref 11.5–15.5)
WBC: 3.7 10*3/uL — ABNORMAL LOW (ref 4.0–10.5)
nRBC: 0 % (ref 0.0–0.2)

## 2020-08-13 NOTE — ED Triage Notes (Signed)
Pt. Stated[facility] And said I had low blood and need to come here. Ive been dizzy sometimes.  Pt. Has a report with her.

## 2020-08-14 ENCOUNTER — Encounter (HOSPITAL_COMMUNITY): Payer: Self-pay | Admitting: Internal Medicine

## 2020-08-14 ENCOUNTER — Other Ambulatory Visit: Payer: Self-pay

## 2020-08-14 ENCOUNTER — Telehealth: Payer: Self-pay | Admitting: Internal Medicine

## 2020-08-14 DIAGNOSIS — D61818 Other pancytopenia: Secondary | ICD-10-CM

## 2020-08-14 DIAGNOSIS — D649 Anemia, unspecified: Secondary | ICD-10-CM | POA: Diagnosis present

## 2020-08-14 DIAGNOSIS — D696 Thrombocytopenia, unspecified: Secondary | ICD-10-CM | POA: Diagnosis present

## 2020-08-14 LAB — RETICULOCYTES
Immature Retic Fract: 11.6 % (ref 2.3–15.9)
Immature Retic Fract: 16.4 % — ABNORMAL HIGH (ref 2.3–15.9)
RBC.: 3.6 MIL/uL — ABNORMAL LOW (ref 3.87–5.11)
RBC.: 3.8 MIL/uL — ABNORMAL LOW (ref 3.87–5.11)
Retic Count, Absolute: 20.5 10*3/uL (ref 19.0–186.0)
Retic Count, Absolute: 25.5 10*3/uL (ref 19.0–186.0)
Retic Ct Pct: 0.6 % (ref 0.4–3.1)
Retic Ct Pct: 0.7 % (ref 0.4–3.1)

## 2020-08-14 LAB — FERRITIN: Ferritin: 4 ng/mL — ABNORMAL LOW (ref 11–307)

## 2020-08-14 LAB — CBC
HCT: 25.7 % — ABNORMAL LOW (ref 36.0–46.0)
Hemoglobin: 7.3 g/dL — ABNORMAL LOW (ref 12.0–15.0)
MCH: 19.6 pg — ABNORMAL LOW (ref 26.0–34.0)
MCHC: 28.4 g/dL — ABNORMAL LOW (ref 30.0–36.0)
MCV: 68.9 fL — ABNORMAL LOW (ref 80.0–100.0)
Platelets: 52 10*3/uL — ABNORMAL LOW (ref 150–400)
RBC: 3.73 MIL/uL — ABNORMAL LOW (ref 3.87–5.11)
RDW: 25.5 % — ABNORMAL HIGH (ref 11.5–15.5)
WBC: 4.8 10*3/uL (ref 4.0–10.5)
nRBC: 0 % (ref 0.0–0.2)

## 2020-08-14 LAB — CBC WITH DIFFERENTIAL/PLATELET
Abs Immature Granulocytes: 0.01 10*3/uL (ref 0.00–0.07)
Basophils Absolute: 0.1 10*3/uL (ref 0.0–0.1)
Basophils Relative: 2 %
Eosinophils Absolute: 0.1 10*3/uL (ref 0.0–0.5)
Eosinophils Relative: 2 %
HCT: 24.3 % — ABNORMAL LOW (ref 36.0–46.0)
Hemoglobin: 6.9 g/dL — CL (ref 12.0–15.0)
Immature Granulocytes: 0 %
Lymphocytes Relative: 53 %
Lymphs Abs: 2.2 10*3/uL (ref 0.7–4.0)
MCH: 19.4 pg — ABNORMAL LOW (ref 26.0–34.0)
MCHC: 28.4 g/dL — ABNORMAL LOW (ref 30.0–36.0)
MCV: 68.3 fL — ABNORMAL LOW (ref 80.0–100.0)
Monocytes Absolute: 0.3 10*3/uL (ref 0.1–1.0)
Monocytes Relative: 7 %
Neutro Abs: 1.5 10*3/uL — ABNORMAL LOW (ref 1.7–7.7)
Neutrophils Relative %: 36 %
Platelets: 58 10*3/uL — ABNORMAL LOW (ref 150–400)
RBC: 3.56 MIL/uL — ABNORMAL LOW (ref 3.87–5.11)
RDW: 25.6 % — ABNORMAL HIGH (ref 11.5–15.5)
WBC: 4.1 10*3/uL (ref 4.0–10.5)
nRBC: 0 % (ref 0.0–0.2)

## 2020-08-14 LAB — COMPREHENSIVE METABOLIC PANEL
ALT: 24 U/L (ref 0–44)
AST: 30 U/L (ref 15–41)
Albumin: 3.5 g/dL (ref 3.5–5.0)
Alkaline Phosphatase: 33 U/L — ABNORMAL LOW (ref 38–126)
Anion gap: 5 (ref 5–15)
BUN: 9 mg/dL (ref 6–20)
CO2: 25 mmol/L (ref 22–32)
Calcium: 8.9 mg/dL (ref 8.9–10.3)
Chloride: 107 mmol/L (ref 98–111)
Creatinine, Ser: 0.75 mg/dL (ref 0.44–1.00)
GFR, Estimated: >60 mL/min (ref >60)
Glucose, Bld: 108 mg/dL — ABNORMAL HIGH (ref 70–99)
Potassium: 3.4 mmol/L — ABNORMAL LOW (ref 3.5–5.1)
Sodium: 137 mmol/L (ref 135–145)
Total Bilirubin: 1.2 mg/dL (ref 0.3–1.2)
Total Protein: 6.3 g/dL — ABNORMAL LOW (ref 6.5–8.1)

## 2020-08-14 LAB — HIV ANTIBODY (ROUTINE TESTING W REFLEX): HIV Screen 4th Generation wRfx: NONREACTIVE

## 2020-08-14 LAB — IRON AND TIBC
Iron: 289 ug/dL — ABNORMAL HIGH (ref 28–170)
Saturation Ratios: 59 % — ABNORMAL HIGH (ref 10.4–31.8)
TIBC: 487 ug/dL — ABNORMAL HIGH (ref 250–450)
UIBC: 198 ug/dL

## 2020-08-14 LAB — VITAMIN B12: Vitamin B-12: 315 pg/mL (ref 180–914)

## 2020-08-14 LAB — PROTIME-INR
INR: 1.2 (ref 0.8–1.2)
Prothrombin Time: 14.9 seconds (ref 11.4–15.2)

## 2020-08-14 LAB — PREPARE RBC (CROSSMATCH)

## 2020-08-14 LAB — RESPIRATORY PANEL BY RT PCR (FLU A&B, COVID)
Influenza A by PCR: NEGATIVE
Influenza B by PCR: NEGATIVE
SARS Coronavirus 2 by RT PCR: NEGATIVE

## 2020-08-14 LAB — LACTATE DEHYDROGENASE: LDH: 112 U/L (ref 98–192)

## 2020-08-14 LAB — ABO/RH: ABO/RH(D): B POS

## 2020-08-14 LAB — FOLATE: Folate: 8.9 ng/mL (ref >5.9)

## 2020-08-14 LAB — APTT: aPTT: 30 seconds (ref 24–36)

## 2020-08-14 LAB — SAVE SMEAR(SSMR), FOR PROVIDER SLIDE REVIEW

## 2020-08-14 MED ORDER — POTASSIUM CHLORIDE CRYS ER 20 MEQ PO TBCR
40.0000 meq | EXTENDED_RELEASE_TABLET | Freq: Once | ORAL | Status: AC
  Administered 2020-08-14: 40 meq via ORAL
  Filled 2020-08-14: qty 2

## 2020-08-14 MED ORDER — FERROUS SULFATE 325 (65 FE) MG PO TABS: 325.0000 mg | ORAL_TABLET | Freq: Every day | ORAL | 0 refills | Status: DC

## 2020-08-14 MED ORDER — SODIUM CHLORIDE 0.9 % IV SOLN
10.0000 mL/h | Freq: Once | INTRAVENOUS | Status: AC
  Administered 2020-08-14: 10 mL/h via INTRAVENOUS

## 2020-08-14 NOTE — Telephone Encounter (Signed)
-----   Message from Alphonzo Severance, MD sent at 08/14/2020 11:46 AM EDT ----- Regarding: Hospital follow-up Hi there,  Could you schedule this patient for a 1-week hospital follow-up with Dr. Mcarthur Rossetti? Date of discharge: 08/14/20.  Thank you!  Sincerely,  Amil Amen

## 2020-08-14 NOTE — ED Notes (Signed)
Upon rounding, patient and mother asked about ABX that the patient is supposed to be taking, pt provided me with clinic paperwork providing information about same.  Metronidazole 500mg  tablet BID x 7 days, started 10/13 & Doxycycline monohydrate 100mg  tablet BID x 14 days, started 10/14 Patient and mother report she has been taking as prescribed since starting, but is unable to tell me what she is taking the ABX for.  V. Rathore unavailable via message. ED secretary paging.

## 2020-08-14 NOTE — Discharge Summary (Signed)
Name: Tammy Hayes MRN: [id number] DOB: [birth year] 24 y.o. PCP: Eliezer Bottom, MD  Date of Admission: 08/13/2020  7:08 PM Date of Discharge: 08/14/2020 Attending Physician: Carlynn Purl DO  Discharge Diagnosis: 1. Acute on Chronic Symptomatic Iron Deficiency Anemia Secondary to Menorrhagia  2. Thrombocytopenia Pancytopenia   Discharge Medications: Allergies as of 08/14/2020      Reactions   Penicillins Anaphylaxis, Itching   Did it involve swelling of the face/tongue/throat, SOB, or low BP? Yes Did it involve sudden or severe rash/hives, skin peeling, or any reaction on the inside of your mouth or nose? Y Did you need to seek medical attention at a hospital or doctor's office? Y When did it last happen?Childhood. If all above answers are "NO", may proceed with cephalosporin use.      Medication List    STOP taking these medications   elvitegravir-cobicistat-emtricitabine-tenofovir 150-150-200-10 MG Tabs tablet Commonly known as: GENVOYA   ferrous sulfate 75 (15 Fe) MG/ML Soln Commonly known as: FER-IN-SOL Replaced by: ferrous sulfate 325 (65 FE) MG tablet     TAKE these medications   ferrous sulfate 325 (65 FE) MG tablet Take 1 tablet (325 mg total) by mouth daily with breakfast. Replaces: ferrous sulfate 75 (15 Fe) MG/ML Soln   MULTIVITAMIN PO Take 1 tablet by mouth daily.   ondansetron 4 MG disintegrating tablet Commonly known as: Zofran ODT Take 1 tablet (4 mg total) by mouth every 8 (eight) hours as needed for nausea or vomiting.   oxyCODONE 5 MG immediate release tablet Commonly known as: Roxicodone Take 1 tablet (5 mg total) by mouth every 6 (six) hours as needed for up to 10 doses for severe pain.       Disposition and follow-up:   [name] was discharged from [facility] in Stable condition.  At the hospital follow up visit please address:  1. Acute on Chronic Symptomatic Iron Deficiency Anemia Secondary to Menorrhagia:  -  Start Ferrous Sulfate 325 mg daily  - Continue Depo Provera  - Follow up with PCP   2. Thrombocytopenia Pancytopenia  - Follow up with PCP  2.  Labs / imaging needed at time of follow-up: CBC   3.  Pending labs/ test needing follow-up: HIV, Haptoglobin  Follow-up Appointments:  Follow-up Information    Aslam, Leanna Sato, MD. Schedule an appointment as soon as possible for a visit in 1 week(s).   Specialty: Internal Medicine Contact information: [address] [place] [address] [phone]               Hospital Course by problem list: 1. Acute on Chronic Symptomatic Iron Deficiency Anemia Secondary to Menorrhagia: Came to [facility] after being told she had abnormal labs. Hgb of 6.8 on admission with MCV 67, notes heavy menses and hx of iron deficiency anemia. Last menses ended on 9/19 and was heavy. She endorsed lightheadedness, fatigue, pica (ice and corn starch). She was prescribed ferrous sulfate in the outpatient setting, but never picked it up. Started Depo Provera the day prior to admission. She was transfused with one unit of packed red blood cells. Her repeat hgb was 7.3 with resolution of her symptoms. She was discharged in stable condition. Her ferrous sulfate was given to her by the transitions of care pharmacy. She will follow up close with her primary care provider.    2. Thrombocytopenia  Patient presented with a WBC of 3.7, hgb 6.8, and platelet count of 63 her repeat CBC  showed a WBC of 4.8k and platelet count of 52k with a LDH within normal limits, her smear review shows microcytic anemia with anisocytosis. and haptoglobin is pending. She did recently have gentamycin for untreated gonorrhea, which may contribute to her thrombocytopenia. She is currently asymptomatic. She was discharged in stable condition with instructions to follow up with her PCP and follow up bloodwork.   Discharge Vitals:   BP (!) 114/58   Pulse 88   Temp 98.4 F (36.9 C) (Oral)    Resp 17   Ht 5\' 6"  (1.676 m)   Wt 90 kg   LMP 07/15/2020   SpO2 100%   BMI 32.02 kg/m   Pertinent Labs, Studies, and Procedures:   Ref Range & Units 2 d ago 2 wk ago 3 mo ago  WBC 4.0 - 10.5 K/uL 3.7Low  7.5  5.6   RBC 3.87 - 5.11 MIL/uL 3.64Low  4.04  4.76   Hemoglobin 12.0 - 15.0 g/dL 1.6XWR6.8Low Panic  6.0AVW7.8Low CM  9.1Low   Comment: REPEATED TO VERIFY  Reticulocyte Hemoglobin testing  may be clinically indicated,  consider ordering this additional  test [id number]  THIS CRITICAL RESULT HAS VERIFIED AND BEEN CALLED TO BOBBY SANGALANIA RN. BY TAMEECO CALDWELL ON 10 18 2021 AT 2144, AND HAS BEEN READ BACK.   HCT 36 - 46 % 24.7Low  27.5Low  32.1Low   MCV 80.0 - 100.0 fL 67.9Low  68.1Low  67.4Low   MCH 26.0 - 34.0 pg 18.7Low  19.3Low  19.1Low   MCHC 30.0 - 36.0 g/dL 78.2NFA27.5Low  21.3YQM28.4Low  57.8ION28.3Low   RDW 11.5 - 15.5 % 25.4High  26.7High  27.9High   Platelets 150 - 400 K/uL 63Low  291  348 CM    Ref Range & Units 2 d ago 2 wk ago 3 mo ago   Sodium 135 - 145 mmol/L 138  140  142   Potassium 3.5 - 5.1 mmol/L 3.3Low  3.3Low  3.6   Chloride 98 - 111 mmol/L 106  103  110   CO2 22 - 32 mmol/L 23  26  24    Glucose, Bld 70 - 99 mg/dL 629BMWU105High  132GMWN122High CM  027OZDG110High CM   Comment: Glucose reference range applies only to samples taken after fasting for at least 8 hours.  BUN 6 - 20 mg/dL 11  7  8    Creatinine, Ser 0.44 - 1.00 mg/dL 6.440.76  0.340.81  7.420.67   Calcium 8.9 - 10.3 mg/dL 8.9  9.4  5.9DGL8.6Low   Total Protein 6.5 - 8.1 g/dL 6.9  7.5  7.9   Albumin 3.5 - 5.0 g/dL 3.9  4.2  4.5   AST 15 - 41 U/L 23  19  22    ALT 0 - 44 U/L 18  14  18    Alkaline Phosphatase 38 - 126 U/L 35Low  38  36Low   Total Bilirubin 0.3 - 1.2 mg/dL 0.7  0.6  0.4   GFR, Estimated >60 mL/min >60     Anion gap 5 - 15 9  11  CM  8 CM     Ref Range & Units 1 d ago  Vitamin B-12 180 - 914 pg/mL 315     Ref Range & Units 1 d ago  Folate >5.9 ng/mL 8.9     Ref Range & Units 1 d ago    Iron 28 - 170 ug/dL 875IEPP289High   TIBC 295250 - 188450 ug/dL 416SAYT487High   Saturation Ratios 10.4 - 31.8 % 59High  UIBC ug/dL 253     Ref Range & Units 1 d ago 1 yr ago  Ferritin 11 - 307 ng/mL 4Low  3Low R         Ref Range & Units 1 d ago  SARS Coronavirus 2 by RT PCR NEGATIVE NEGATIVE     Ref Range & Units 1 d ago  (08/14/20) 2 d ago  (08/13/20)  Retic Ct Pct 0.4 - 3.1 % 0.6  0.7   RBC. 3.87 - 5.11 MIL/uL 3.60Low  3.80Low   Retic Count, Absolute 19.0 - 186.0 K/uL 20.5  25.5   Immature Retic Fract 2.3 - 15.9 % 11.6  16.4High    1 d ago  (08/14/20) 1 d ago  (08/14/20) 2 d ago  (08/13/20)    WBC 4.0 - 10.5 K/uL 4.8  4.1  3.7Low   RBC 3.87 - 5.11 MIL/uL 3.73Low  3.56Low  3.64Low   Hemoglobin 12.0 - 15.0 g/dL 6.6YQI  3.4VQQ Panic CM  6.8Low Panic CM   Comment: REPEATED TO VERIFY  POST TRANSFUSION SPECIMEN  Reticulocyte Hemoglobin testing  may be clinically indicated,  consider ordering this additional  test [id number]   HCT 36 - 46 % 25.7Low  24.3Low  24.7Low   MCV 80.0 - 100.0 fL 68.9Low  68.3Low  67.9Low   Comment: POST TRANSFUSION SPECIMEN  REPEATED TO VERIFY   MCH 26.0 - 34.0 pg 19.6Low  19.4Low  18.7Low   MCHC 30.0 - 36.0 g/dL 64.3PIR  51.8ACZ  66.0YTK   RDW 11.5 - 15.5 % 25.5High  25.6High  25.4High   Platelets 150 - 400 K/uL 52Low  58Low CM  63Low CM   Comment: REPEATED TO VERIFY    Component 1 d ago  Path Review Microcytic anemia with anisocytosis.    Ref Range & Units 1 d ago   LDH 98 - 192 U/L 112     Discharge Instructions: Discharge Instructions    Call MD for:  difficulty breathing, headache or visual disturbances   Complete by: As directed    Call MD for:  persistant dizziness or light-headedness   Complete by: As directed    Call MD for:  persistant nausea and vomiting   Complete by: As directed    Call MD for:  severe uncontrolled pain   Complete by: As directed    Increase activity slowly    Complete by: As directed       Ms. Gangemi,   It was a pleasure taking care of you. You were admitted to the hospital and treated for iron deficiency anemia, meaning that your blood count (hemoglobin) was low at 6.8. You were given a blood transfusion to bring your blood count up to 7.3. Your iron deficiency anemia is likely due to your heavy menstrual bleeding. The Depo-Provera shot you received at the health department will help regulate your cycles so that hopefully you don't bleed as much. It is VERY important that you take your iron supplement as directed in order to keep your blood levels up.  Your lab work also showed that your platelets (tiny blood cells that help your body form clots to stop bleeding) are low. Sometimes viruses and/or medicines can cause this. We are testing you for HIV and will let you know the result of this test.  We would like for you to follow-up with your primary care doctor. I have sent a message to the [facility] to schedule you an appointment in the next week. Someone should  reach out to you to schedule this.  Take care!    Signed: Dolan Amen, MD 08/15/2020, 11:44 AM   Pager: [phone]

## 2020-08-14 NOTE — H&P (Addendum)
Date: 08/14/2020               Patient Name:  Tammy Hayes MRN: [id number]  DOB: [birth year] Age / Sex: 24 y.o., female   PCP: Eliezer Bottom, MD         Medical Service: Internal Medicine Teaching Service         Attending Physician: Dr. Gust Rung, DO    First Contact: Dr. [name] Pager: [phone]  Second Contact: Dr. Sande Brothers Pager: [phone]       After Hours (After 5p/  First Contact Pager: [phone]  weekends / holidays): Second Contact Pager: [phone]   Chief Complaint: symptomatic anemia  History of Present Illness:   ANNDREA Hayes is a 24 y.o. year old female with hx of iron deficiency anemia, HTN, pre-DM presenting for symptomatic anemia. [facility] for heavy menses, when lab work revealed hgb of 7.2. She does note feeling lightheaded with activity for the past few months as well as pain with menstruation that is unrelieved with Ibuprofen. In regards to her menorrhagia, she states that it has been ongoing since menarche. Last period ended on 10/19, was heavy, lasted 5 days. Was started on Depo Provera today. Has previously been prescribed ferrous sulfate but has not taken it. She denies prior blood transfusions. She endorses fatigue, malaise but denies fevers, chills, weight loss, bleeding from other sites, vision changes, rashes, chest pain, palpitation, nausea, vomiting, abdominal pain, difficulty with urination. She craves ice and corn starch.  Meds:  Current Outpatient Medications  Medication Instructions  . elvitegravir-cobicistat-emtricitabine-tenofovir (GENVOYA) 150-150-200-10 MG TABS tablet 1 tablet, Oral, Daily with breakfast  . ferrous sulfate (FER-IN-SOL) 75 (15 Fe) MG/ML SOLN 15 mg of iron, Oral, Every other day  . Multiple Vitamin (MULTIVITAMIN PO) 1 tablet, Oral, Daily  . ondansetron (ZOFRAN ODT) 4 mg, Oral, Every 8 hours PRN  . oxyCODONE (ROXICODONE) 5 mg, Oral, Every 6 hours PRN   Allergies: Allergies as of 08/13/2020 - Review  Complete 08/13/2020  Allergen Reaction Noted  . Penicillins Anaphylaxis and Itching 04/12/2012   Past Medical History:  Diagnosis Date  . Pediatric overweight 07/13/2013  . Pre-diabetes     Family History:  Family History  Problem Relation Age of Onset  . Depression Mother        grandparents, aunts/uncles.   . Heart disease Other        aunts/uncles  . Hypertension Other        aunts/uncles, grandparents  . Kidney disease Other        aunts/uncles, grandparents  . Stroke Other        aunts/uncles  . Cancer Neg Hx     Social History:  Social History   Tobacco Use  . Smoking status: Current Some Day Smoker    Packs/day: 0.05  . Smokeless tobacco: Never Used  Substance Use Topics  . Alcohol use: Not Currently  . Drug use: Yes    Types: Marijuana    Comment: Sometimes.    Review of Systems: Review of Systems  Constitutional: Positive for malaise/fatigue. Negative for chills, fever and weight loss.  HENT: Negative for congestion and sore throat.   Eyes: Negative for blurred vision and double vision.  Respiratory: Positive for cough and shortness of breath (with activity).   Cardiovascular: Negative for chest pain and palpitations.  Gastrointestinal: Negative for abdominal pain, blood in stool, diarrhea, nausea and vomiting.  Genitourinary: Negative for dysuria, hematuria and  urgency.  Musculoskeletal: Negative for myalgias and neck pain.  Skin: Negative for itching and rash.  Neurological: Positive for dizziness and weakness. Negative for loss of consciousness.     Physical Exam: Physical Exam Vitals and nursing note reviewed.  Constitutional:      General: She is not in acute distress.    Appearance: Normal appearance. She is not ill-appearing.  HENT:     Head: Normocephalic and atraumatic.     Right Ear: External ear normal.     Left Ear: External ear normal.     Nose: Nose normal.     Mouth/Throat:     Mouth: Mucous membranes are moist.  Eyes:      Extraocular Movements: Extraocular movements intact.     Comments: Pale conjunctiva  Cardiovascular:     Rate and Rhythm: Normal rate and regular rhythm.     Pulses: Normal pulses.     Heart sounds: Normal heart sounds. No murmur heard.  No friction rub. No gallop.   Pulmonary:     Effort: Pulmonary effort is normal. No respiratory distress.     Breath sounds: Normal breath sounds. No stridor. No wheezing, rhonchi or rales.  Abdominal:     General: Abdomen is flat.     Palpations: Abdomen is soft.     Tenderness: There is no abdominal tenderness. There is no guarding.  Musculoskeletal:        General: No swelling. Normal range of motion.     Cervical back: Normal range of motion and neck supple.  Skin:    General: Skin is warm and dry.  Neurological:     General: No focal deficit present.     Mental Status: She is alert and oriented to person, place, and time.  Psychiatric:        Mood and Affect: Mood normal.        Behavior: Behavior normal.      CBC Latest Ref Rng & Units 08/13/2020 07/28/2020 04/28/2020  WBC 4.0 - 10.5 K/uL 3.7(L) 7.5 5.6  Hemoglobin 12.0 - 15.0 g/dL 6.8(LL) 7.8(L) 9.1(L)  Hematocrit 36 - 46 % 24.7(L) 27.5(L) 32.1(L)  Platelets 150 - 400 K/uL 63(L) 291 348     Assessment & Plan by Problem: Principal Problem:   Symptomatic anemia Active Problems:   Iron deficiency anemia   Pancytopenia (HCC)   Tammy Hayes is a 24 y.o. year old female with hx of iron deficiency anemia, HTN, pre-DM presenting for symptomatic anemia. Hx of heavy menses and iron deficiency.  Acute on chronic symptomatic anemia, microcytic New pancytopenia Heavy menses Hgb of 6.8 on admission with MCV 67, notes heavy menses and hx of iron deficiency anemia. Last menses ended on 10/19 and was heavy. +lightheadedness, +fatigue, +pica (ice and corn starch). She was prescribed ferrous sulfate previously but never picked it up. Started on Depo Provera today.  Also noted to have low WBC of 3.7  and plt of 63, which are new. No known FHx of personal hx of cancer. Retic count of 0.7%, suggesting production issue. -transfuse 1 unit -f/u iron studies -f/u post transfusion H/H -f/u repeat CBC with diff     -if abnormal, get blood smear, haptoglobin, LDH -f/u anemia panel -continue depo-provera outpatient -continue ferrous sulfate outpatient  Hx HTN High blood pressure was noted on clinic visit, but elected to try lifestyle changes before starting meds. BP normotensive this admission  Dispo: Admit patient to Observation with expected length of stay less than 2 midnights.  Signed: Remo Lipps, MD 08/14/2020, 1:33 AM  Pager: [phone]  After 5pm on weekdays and 1pm on weekends: On Call pager: [phone]

## 2020-08-14 NOTE — Telephone Encounter (Signed)
TOC 08/23/20 @ 9:45 am.  Please refer to message below.  Unable to contact patient via telephone, left detailed message asking to please call clinic back. Future appointment made and mailed to patient.
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
## 2020-08-14 NOTE — ED Notes (Signed)
Pt ambulated to bathroom with assistance.

## 2020-08-14 NOTE — ED Provider Notes (Signed)
[facility] EMERGENCY DEPARTMENT Provider Note   CSN: [id number] Arrival date & time: 08/13/20  1851     History Chief Complaint  Patient presents with  . Anemia    Hgb=6.8    Tammy Hayes is a 24 y.o. female.  The history is provided by the patient and medical records.  Anemia   24 year old female with history of iron deficiency anemia, presenting to the ED with low blood counts.  Patient states [facility] for [facility] control management.  States she does have a history of very heavy menstrual cycles and was started on Depo Provera.  She was given first injection today.  She did report some dizziness while there, mostly with position changes and described as "lightheaded and seeing stars".  She has not had any syncopal events.  She had blood work done there with low hemoglobin and was sent here for further evaluation.  She denies any blood in the stool.  States she has not been on iron supplements for quite some time.  Denies any covid exposures or recent sick contacts.  No fever or other infectious symptoms.  Past Medical History:  Diagnosis Date  . Pediatric overweight 07/13/2013  . Pre-diabetes     Patient Active Problem List   Diagnosis Date Noted  . HSIL (high grade squamous intraepithelial lesion) on Pap smear of cervix 05/09/2020  . Depressive episode 05/09/2020  . Hypertension 05/03/2019  . Iron deficiency anemia 04/19/2019  . Menorrhagia 04/19/2019  . Prediabetes 07/06/2014  . Acanthosis nigricans 05/09/2014  . Learning disability 07/13/2013    Past Surgical History:  Procedure Laterality Date  . EYE MUSCLE SURGERY     in 3rd grade. No reported residual weakness.      OB History   No obstetric history on file.     Family History  Problem Relation Age of Onset  . Depression Mother        grandparents, aunts/uncles.   . Heart disease Other        aunts/uncles  . Hypertension Other         aunts/uncles, grandparents  . Kidney disease Other        aunts/uncles, grandparents  . Stroke Other        aunts/uncles    Social History   Tobacco Use  . Smoking status: Never Smoker  . Smokeless tobacco: Never Used  Substance Use Topics  . Alcohol use: Yes    Comment: Sometimes.  . Drug use: Yes    Types: Marijuana    Comment: Sometimes.    Home Medications Prior to Admission medications   Medication Sig Start Date End Date Taking? Authorizing Provider  elvitegravir-cobicistat-emtricitabine-tenofovir (GENVOYA) 150-150-200-10 MG TABS tablet Take 1 tablet by mouth daily with breakfast. 04/28/20   Robinson, [place] N, PA-C  ferrous sulfate (FER-IN-SOL) 75 (15 Fe) MG/ML SOLN Take 1 mL (15 mg of iron total) by mouth every other day. 05/09/20   Eliezer Bottom, MD  Multiple Vitamin (MULTIVITAMIN PO) Take 1 tablet by mouth daily.    [provider]  ondansetron (ZOFRAN ODT) 4 MG disintegrating tablet Take 1 tablet (4 mg total) by mouth every 8 (eight) hours as needed for nausea or vomiting. 04/28/20   Robinson, [place] N, PA-C  oxyCODONE (ROXICODONE) 5 MG immediate release tablet Take 1 tablet (5 mg total) by mouth every 6 (six) hours as needed for up to 10 doses for severe pain. 07/29/20   Pricilla Loveless, MD  Allergies    Penicillins  Review of Systems   Review of Systems  Neurological: Positive for dizziness.  All other systems reviewed and are negative.   Physical Exam Updated Vital Signs BP 138/80 (BP Location: Right Arm)   Pulse (!) 102   Temp 98.4 F (36.9 C) (Oral)   Resp 16   LMP 07/15/2020   SpO2 100%   Physical Exam Vitals and nursing note reviewed.  Constitutional:      Appearance: She is well-developed.  HENT:     Head: Normocephalic and atraumatic.  Eyes:     Conjunctiva/sclera: Conjunctivae normal.     Pupils: Pupils are equal, round, and reactive to light.     Comments: Conjunctiva pale  Cardiovascular:     Rate and Rhythm: Normal rate and  regular rhythm.     Heart sounds: Normal heart sounds.  Pulmonary:     Effort: Pulmonary effort is normal.     Breath sounds: Normal breath sounds.  Abdominal:     General: Bowel sounds are normal.     Palpations: Abdomen is soft.  Musculoskeletal:        General: Normal range of motion.     Cervical back: Normal range of motion.  Skin:    General: Skin is warm and dry.  Neurological:     Mental Status: She is alert and oriented to person, place, and time.     ED Results / Procedures / Treatments   Labs (all labs ordered are listed, but only abnormal results are displayed) Labs Reviewed  CBC WITH DIFFERENTIAL/PLATELET - Abnormal; Notable for the following components:      Result Value   WBC 3.7 (*)    RBC 3.64 (*)    Hemoglobin 6.8 (*)    HCT 24.7 (*)    MCV 67.9 (*)    MCH 18.7 (*)    MCHC 27.5 (*)    RDW 25.4 (*)    Platelets 63 (*)    Neutro Abs 1.5 (*)    All other components within normal limits  COMPREHENSIVE METABOLIC PANEL - Abnormal; Notable for the following components:   Potassium 3.3 (*)    Glucose, Bld 105 (*)    Alkaline Phosphatase 35 (*)    All other components within normal limits  RETICULOCYTES - Abnormal; Notable for the following components:   RBC. 3.80 (*)    Immature Retic Fract 16.4 (*)    All other components within normal limits  RESPIRATORY PANEL BY RT PCR (FLU A&B, COVID)  VITAMIN B12  FOLATE  IRON AND TIBC  FERRITIN  TYPE AND SCREEN  PREPARE RBC (CROSSMATCH)    EKG None  Radiology No results found.  Procedures Procedures (including critical care time)  CRITICAL CARE Performed by: Garlon Hatchet   Total critical care time: 40 minutes  Critical care time was exclusive of separately billable procedures and treating other patients.  Critical care was necessary to treat or prevent imminent or life-threatening deterioration.  Critical care was time spent personally by me on the following activities: development of treatment  plan with patient and/or surrogate as well as nursing, discussions with consultants, evaluation of patient's response to treatment, examination of patient, obtaining history from patient or surrogate, ordering and performing treatments and interventions, ordering and review of laboratory studies, ordering and review of radiographic studies, pulse oximetry and re-evaluation of patient's condition.   Medications Ordered in ED Medications  0.9 %  sodium chloride infusion (has no administration in time range)  potassium chloride SA (KLOR-CON) CR tablet 40 mEq (has no administration in time range)    ED Course  I have reviewed the triage vital signs and the nursing notes.  Pertinent labs & imaging results that were available during my care of the patient were reviewed by me and considered in my medical decision making (see chart for details).    MDM Rules/Calculators/A&P  24 year old female [facility] due [facility].  Does have a history of iron deficiency anemia and heavy vaginal bleeding.  She was started on Depo-Provera today for this  States she has not taken iron supplements for quite some time.  She is afebrile and nontoxic in appearance here.  Conjunctivodoes appear pale but he is hemodynamically stable.  Labs today with a pancytopenia, hemoglobin 6.8, platelets 63K, WBC count 3.7.  Discussion with patient regarding the risks versus benefits of transfusion and she elected to proceed.  Will admit for observation.  Anemia panel has been ordered and sent prior to transfusion.  Start with 1 unit PRBC.  Discussed with IM residents, they will see in ED and admit for ongoing care.  Final Clinical Impression(s) / ED Diagnoses Final diagnoses:  Pancytopenia [facility])    Rx / DC Orders ED Discharge Orders    None       Garlon Hatchet, PA-C 08/14/20 0115    Derwood Kaplan, MD 08/15/20 [phone]

## 2020-08-15 LAB — BPAM RBC
Blood Product Expiration Date: 202111122359
ISSUE DATE / TIME: 202110190108
Unit Type and Rh: 7300

## 2020-08-15 LAB — PATHOLOGIST SMEAR REVIEW

## 2020-08-15 LAB — TYPE AND SCREEN
ABO/RH(D): B POS
Antibody Screen: NEGATIVE
Unit division: 0

## 2020-08-23 ENCOUNTER — Other Ambulatory Visit: Payer: Self-pay

## 2020-08-23 ENCOUNTER — Encounter: Payer: Self-pay | Admitting: Internal Medicine

## 2020-08-23 ENCOUNTER — Ambulatory Visit: Payer: Self-pay | Admitting: Internal Medicine

## 2020-08-23 VITALS — BP 137/74 | HR 100 | Temp 98.9°F | Ht 67.0 in | Wt 221.0 lb

## 2020-08-23 DIAGNOSIS — I10 Essential (primary) hypertension: Secondary | ICD-10-CM

## 2020-08-23 DIAGNOSIS — D500 Iron deficiency anemia secondary to blood loss (chronic): Secondary | ICD-10-CM

## 2020-08-23 DIAGNOSIS — Z23 Encounter for immunization: Secondary | ICD-10-CM

## 2020-08-23 DIAGNOSIS — D61818 Other pancytopenia: Secondary | ICD-10-CM

## 2020-08-23 NOTE — Assessment & Plan Note (Signed)
Patient was admitted on 10/18-10/19 with severe anemia in setting of iron deficiency anemia requiring 1u pRBC with improvement in Hb from 6.8 to 7.3. However, also noted to have thrombocytopenia and leukopenia on labs. Pathologist smear with microcytic anemia with anisocytosis. She did recently receive gentamycin for gonorrhea infection. Suspect that her pancytopenia may be a result of this. She denies any fevers/chills, fatigue, new bruising, easy bleeding, gingival bleeding at this time.   Plan - CBC at this visit

## 2020-08-23 NOTE — Progress Notes (Signed)
   CC: hospital follow up   HPI:  [name] is a 23 y.o. with history of prediabetes and iron deficiency anemia presenting for hospital follow up. She was recently admitted 10/18-10/19 for severe anemia requiring blood transfusion and noted to be pancytopenic during this admission. Her symptoms improved with administration of 1u pRBC. Today, she denies any acute concerns. Please see problem based charting for complete assessment and plan.  Past Medical History:  Diagnosis Date  . Pediatric overweight 07/13/2013  . Pre-diabetes    Review of Systems:  Negative except as stated in HPI.  Physical Exam:  Vitals:   08/23/20 0958 08/23/20 1045  BP: (!) 152/76 137/74  Pulse: (!) 108 100  Temp: 98.9 F (37.2 C)   TempSrc: Oral   SpO2: 100%   Weight: 221 lb (100.2 kg)   Height: 5\' 7"  (1.702 m)    Physical Exam  Constitutional: Appears well-developed and well-nourished. No distress.  HENT: Normocephalic and atraumatic, EOMI, conjunctiva normal, moist mucous membranes Cardiovascular: Normal rate, regular rhythm, S1 and S2 present, no murmurs, rubs, gallops.  Distal pulses intact Respiratory: No respiratory distress, no accessory muscle use.  Effort is normal.  Lungs are clear to auscultation bilaterally. Skin: Warm and dry.  No rash, erythema, bruises, lesions noted. Psychiatric: Normal mood and affect. Behavior is normal. Judgment and thought content normal.  PHQ-9: 16   Assessment & Plan:   See Encounters Tab for problem based charting.  Patient discussed with Dr. 

## 2020-08-23 NOTE — Assessment & Plan Note (Signed)
BP Readings from Last 3 Encounters:  08/23/20 137/74  08/14/20 (!) 114/58  08/02/20 126/75   Patient without history of hypertension and most recent BP's have been normotensive. However, noted to have elevated BP at this visit with initial BP of 152/76, repeat of 137/74. She is noted to have ~20lb weight gain over the past several months which could be contributing to her hypertension.   Plan: - Recommend for lifestyle modification with low sodium diet and exercise - Follow up in 3 months for BP check  - Will consider initiation of antihypertensive at next visit

## 2020-08-23 NOTE — Assessment & Plan Note (Signed)
Patient has a history of menorrhagia with noncompliant with iron supplementation. She presented on 10/18 to ED with severe anemia to 6.8 for which she was admitted to [facility]MT[facility]. She received 1u pRBC during admission with improvement of Hb to 7.3. She was discharged with iron supplementation which she notes that she has been taking. Patient received Depot shot on 10/18.   Plan:  Continue ferrous sulfate 325mg  daily  F/u in 4-6 weeks for iron studies

## 2020-08-23 NOTE — Patient Instructions (Addendum)
Tammy Hayes,  It was a pleasure seeing you in clinic. Today we discussed:   Iron deficiency anemia: Please continue to take your iron supplements daily.   Depression: I will have our new counselor reach out to you to further discuss this   Contraception: I recommend using condoms to prevent future sexually transmitted infections   If you have any questions or concerns, please call our clinic at [phone] between 9am-5pm and after hours call [phone] and ask for the internal medicine resident on call. If you feel you are having a medical emergency please call 911.   Thank you, we look forward to helping you remain healthy!

## 2020-08-24 LAB — CBC
Hematocrit: 30.6 % — ABNORMAL LOW (ref 34.0–46.6)
Hemoglobin: 8.8 g/dL — ABNORMAL LOW (ref 11.1–15.9)
MCH: 19.6 pg — ABNORMAL LOW (ref 26.6–33.0)
MCHC: 28.8 g/dL — ABNORMAL LOW (ref 31.5–35.7)
MCV: 68 fL — ABNORMAL LOW (ref 79–97)
Platelets: 353 10*3/uL (ref 150–450)
RBC: 4.48 x10E6/uL (ref 3.77–5.28)
RDW: 26.2 % — ABNORMAL HIGH (ref 11.7–15.4)
WBC: 8.4 10*3/uL (ref 3.4–10.8)

## 2020-08-24 NOTE — Progress Notes (Signed)
CBC at this visit with stable microcytic anemia. Hb improved to 8.8 from 7.3 on hospital discharge. Patient advised to continue with iron supplementation at this time. She expresses understanding.

## 2020-08-27 ENCOUNTER — Telehealth: Payer: Self-pay

## 2020-08-27 NOTE — Telephone Encounter (Signed)
Requesting lab results, please call pt back.  

## 2020-08-27 NOTE — Telephone Encounter (Signed)
Eliezer Bottom, MD  08/24/2020 6:41 PM EDT Back to Top    CBC at this visit with stable microcytic anemia. Hb improved to 8.8 from 7.3 on hospital discharge. Patient advised to continue with iron supplementation at this time. She expresses understanding.    RTC, Patient states she wants to know what her lab result was from office visit.   RN relayed above message again to patient and she verbalized understanding.  No other concerns voiced. SChaplin, RN,BSN

## 2020-08-27 NOTE — Progress Notes (Signed)
Internal Medicine Clinic Attending ° °Case discussed with Dr. Aslam  At the time of the visit.  We reviewed the resident’s history and exam and pertinent patient test results.  I agree with the assessment, diagnosis, and plan of care documented in the resident’s note.  °

## 2021-03-15 ENCOUNTER — Encounter (HOSPITAL_COMMUNITY): Payer: Self-pay | Admitting: *Deleted

## 2021-03-15 ENCOUNTER — Emergency Department (HOSPITAL_COMMUNITY)
Admission: EM | Admit: 2021-03-15 | Discharge: 2021-03-16 | Disposition: A | Discharge status: Home / Self Care | Payer: Self-pay | Attending: Emergency Medicine | Admitting: Emergency Medicine

## 2021-03-15 ENCOUNTER — Other Ambulatory Visit: Payer: Self-pay

## 2021-03-15 DIAGNOSIS — F17200 Nicotine dependence, unspecified, uncomplicated: Secondary | ICD-10-CM | POA: Insufficient documentation

## 2021-03-15 DIAGNOSIS — R1084 Generalized abdominal pain: Secondary | ICD-10-CM | POA: Insufficient documentation

## 2021-03-15 DIAGNOSIS — D649 Anemia, unspecified: Secondary | ICD-10-CM | POA: Insufficient documentation

## 2021-03-15 DIAGNOSIS — R197 Diarrhea, unspecified: Secondary | ICD-10-CM | POA: Insufficient documentation

## 2021-03-15 DIAGNOSIS — R112 Nausea with vomiting, unspecified: Secondary | ICD-10-CM | POA: Insufficient documentation

## 2021-03-15 DIAGNOSIS — I10 Essential (primary) hypertension: Secondary | ICD-10-CM | POA: Insufficient documentation

## 2021-03-15 HISTORY — DX: Unspecified ovarian cyst, right side: N83.201

## 2021-03-15 HISTORY — DX: Unspecified ovarian cyst, right side: N83.202

## 2021-03-15 LAB — COMPREHENSIVE METABOLIC PANEL
ALT: 19 U/L (ref 0–44)
AST: 28 U/L (ref 15–41)
Albumin: 4.2 g/dL (ref 3.5–5.0)
Alkaline Phosphatase: 40 U/L (ref 38–126)
Anion gap: 5 (ref 5–15)
BUN: 10 mg/dL (ref 6–20)
CO2: 25 mmol/L (ref 22–32)
Calcium: 9.4 mg/dL (ref 8.9–10.3)
Chloride: 107 mmol/L (ref 98–111)
Creatinine, Ser: 0.81 mg/dL (ref 0.44–1.00)
GFR, Estimated: >60 mL/min (ref >60)
Glucose, Bld: 98 mg/dL (ref 70–99)
Potassium: 3.7 mmol/L (ref 3.5–5.1)
Sodium: 137 mmol/L (ref 135–145)
Total Bilirubin: 0.3 mg/dL (ref 0.3–1.2)
Total Protein: 7.4 g/dL (ref 6.5–8.1)

## 2021-03-15 LAB — CBC WITH DIFFERENTIAL/PLATELET
Abs Immature Granulocytes: 0.02 10*3/uL (ref 0.00–0.07)
Basophils Absolute: 0.1 10*3/uL (ref 0.0–0.1)
Basophils Relative: 1 %
Eosinophils Absolute: 0 10*3/uL (ref 0.0–0.5)
Eosinophils Relative: 1 %
HCT: 37.3 % (ref 36.0–46.0)
Hemoglobin: 10.9 g/dL — ABNORMAL LOW (ref 12.0–15.0)
Immature Granulocytes: 0 %
Lymphocytes Relative: 29 %
Lymphs Abs: 1.9 10*3/uL (ref 0.7–4.0)
MCH: 21.3 pg — ABNORMAL LOW (ref 26.0–34.0)
MCHC: 29.2 g/dL — ABNORMAL LOW (ref 30.0–36.0)
MCV: 72.9 fL — ABNORMAL LOW (ref 80.0–100.0)
Monocytes Absolute: 0.5 10*3/uL (ref 0.1–1.0)
Monocytes Relative: 7 %
Neutro Abs: 4.1 10*3/uL (ref 1.7–7.7)
Neutrophils Relative %: 62 %
Platelets: 223 10*3/uL (ref 150–400)
RBC: 5.12 MIL/uL — ABNORMAL HIGH (ref 3.87–5.11)
RDW: 21.7 % — ABNORMAL HIGH (ref 11.5–15.5)
WBC: 6.6 10*3/uL (ref 4.0–10.5)
nRBC: 0 % (ref 0.0–0.2)

## 2021-03-15 LAB — URINALYSIS, ROUTINE W REFLEX MICROSCOPIC
Bilirubin Urine: NEGATIVE
Glucose, UA: NEGATIVE mg/dL
Hgb urine dipstick: NEGATIVE
Ketones, ur: NEGATIVE mg/dL
Nitrite: NEGATIVE
Protein, ur: NEGATIVE mg/dL
Specific Gravity, Urine: 1.025 (ref 1.005–1.030)
pH: 5 (ref 5.0–8.0)

## 2021-03-15 LAB — LIPASE, BLOOD: Lipase: 37 U/L (ref 11–51)

## 2021-03-15 LAB — I-STAT BETA HCG BLOOD, ED (MC, WL, AP ONLY): I-stat hCG, quantitative: <5 m[IU]/mL (ref <5)

## 2021-03-15 NOTE — ED Triage Notes (Signed)
PT states abd pain and vomiting since yesterday.  Pt has hx of ovarian cysts and has not menstruated for 6 months.

## 2021-03-15 NOTE — ED Provider Notes (Signed)
Emergency Medicine Provider Triage Evaluation Note  Tammy Hayes , a 25 y.o. female  was evaluated in triage.  Pt complains of left side abdominal pain with vomiting and diarrhea since yesterday.  Last menstrual cycle 4 years ago, history of ovarian cysts.  Review of Systems  Positive: Abdominal pain, vomiting Negative: Vaginal bleeding  Physical Exam  BP (!) 142/69 (BP Location: Right Arm)   Pulse (!) 103   Temp 99.1 F (37.3 C)   Resp 16   SpO2 100%  Gen:   Awake, no distress   Resp:  Normal effort  MSK:   Moves extremities without difficulty  Other:    Medical Decision Making  Medically screening exam initiated at 6:36 PM.  Appropriate orders placed.  Tammy Hayes was informed that the remainder of the evaluation will be completed by another provider, this initial triage assessment does not replace that evaluation, and the importance of remaining in the ED until their evaluation is complete.     Jeannie Fend, PA-C 03/15/21 Marcial Pacas, MD 03/15/21 [phone]

## 2021-03-16 ENCOUNTER — Other Ambulatory Visit: Payer: Self-pay

## 2021-03-16 ENCOUNTER — Encounter (HOSPITAL_COMMUNITY): Payer: Self-pay | Admitting: Student

## 2021-03-16 ENCOUNTER — Emergency Department (HOSPITAL_COMMUNITY): Payer: Self-pay

## 2021-03-16 MED ORDER — FAMOTIDINE IN NACL 20-0.9 MG/50ML-% IV SOLN
20.0000 mg | Freq: Once | INTRAVENOUS | Status: AC
  Administered 2021-03-16: 20 mg via INTRAVENOUS
  Filled 2021-03-16: qty 50

## 2021-03-16 MED ORDER — ONDANSETRON HCL 4 MG/2ML IJ SOLN
4.0000 mg | Freq: Once | INTRAMUSCULAR | Status: AC
  Administered 2021-03-16: 4 mg via INTRAVENOUS
  Filled 2021-03-16: qty 2

## 2021-03-16 MED ORDER — ONDANSETRON 4 MG PO TBDP: 4.0000 mg | ORAL_TABLET | Freq: Three times a day (TID) | ORAL | 0 refills | Status: DC | PRN

## 2021-03-16 MED ORDER — SODIUM CHLORIDE 0.9 % IV BOLUS
1000.0000 mL | Freq: Once | INTRAVENOUS | Status: AC
  Administered 2021-03-16: 1000 mL via INTRAVENOUS

## 2021-03-16 MED ORDER — DICYCLOMINE HCL 20 MG PO TABS: 20.0000 mg | ORAL_TABLET | Freq: Two times a day (BID) | ORAL | 0 refills | Status: DC | PRN

## 2021-03-16 NOTE — Discharge Instructions (Addendum)
You were seen in the ER today for abdominal pain with nausea, vomiting and diarrhea. Your labs showed that you are mildly anemic. Your CT scan did not show findings of appendicitis.   We are sending you home with the following medications to help with your symptoms:  - Bentyl- take every 8 hours as needed for abdominal spasms/cramping.  - Zofran- please take every 8 hours as needed for nausea/vomiting.   We have prescribed you new medication(s) today. Discuss the medications prescribed today with your pharmacist as they can have adverse effects and interactions with your other medicines including over the counter and prescribed medications. Seek medical evaluation if you start to experience new or abnormal symptoms after taking one of these medicines, seek care immediately if you start to experience difficulty breathing, feeling of your throat closing, facial swelling, or rash as these could be indications of a more serious allergic reaction  Please follow attached diet guidelines.   Follow up with your primary care provider within 3 days for re-evaluation. Please also follow up with OBGYN For further discussion/evaluation of your lack of menstrual periods.   Return to the ER for new or worsening symptoms including but not limited to worsened pain, new pain, inability to keep fluids down, blood in vomit/stool, passing out, or any other concerns.

## 2021-03-16 NOTE — ED Notes (Signed)
Pt provided ginger ale per PA.  

## 2021-03-16 NOTE — ED Provider Notes (Signed)
[facility] EMERGENCY DEPARTMENT Provider Note   CSN: [id number] Arrival date & time: 03/15/21  1601     History Chief Complaint  Patient presents with  . Emesis  . Abdominal Pain    Tammy Hayes is a 25 y.o. female with a hx of tobacco abuse, hypertension, and anemia who presents to the ED with complaints of N/V/D & abdominal pain x 3 days. Patient states she has had 2 episodes of emesis and numerous episodes of diarrhea with associated intermittent abdominal pain. No alleviating/aggravating factors. Denies fever, chills, hematemesis, melena, hematochezia, dysuria, vaginal bleeding, or vaginal discharge. LMP 4 years prior, states she was on depo, but was having irregular bleeding then stopped having periods, no longer on depo.   HPI     Past Medical History:  Diagnosis Date  . Bilateral ovarian cysts   . Pediatric overweight 07/13/2013  . Pre-diabetes     Patient Active Problem List   Diagnosis Date Noted  . Symptomatic anemia 08/14/2020  . Pancytopenia (HCC) 08/14/2020  . Thrombocytopenia (HCC) 08/14/2020  . HSIL (high grade squamous intraepithelial lesion) on Pap smear of cervix 05/09/2020  . Depressive episode 05/09/2020  . Hypertension 05/03/2019  . Iron deficiency anemia 04/19/2019  . Menorrhagia 04/19/2019  . Prediabetes 07/06/2014  . Acanthosis nigricans 05/09/2014  . Learning disability 07/13/2013    Past Surgical History:  Procedure Laterality Date  . EYE MUSCLE SURGERY     in 3rd grade. No reported residual weakness.      OB History   No obstetric history on file.     Family History  Problem Relation Age of Onset  . Depression Mother        grandparents, aunts/uncles.   . Heart disease Other        aunts/uncles  . Hypertension Other        aunts/uncles, grandparents  . Kidney disease Other        aunts/uncles, grandparents  . Stroke Other        aunts/uncles  . Cancer Neg Hx     Social History   Tobacco Use  . Smoking  status: Current Some Day Smoker    Packs/day: 0.05  . Smokeless tobacco: Never Used  Substance Use Topics  . Alcohol use: Not Currently  . Drug use: Not Currently    Types: Marijuana    Comment: Sometimes.    Home Medications Prior to Admission medications   Medication Sig Start Date End Date Taking? Authorizing Provider  ferrous sulfate 325 (65 FE) MG tablet Take 1 tablet (325 mg total) by mouth daily with breakfast. 08/14/20 11/12/20  Alphonzo Severance, MD    Allergies    Penicillins  Review of Systems   Review of Systems  Constitutional: Negative for chills and fever.  Respiratory: Negative for shortness of breath.   Cardiovascular: Negative for chest pain.  Gastrointestinal: Positive for abdominal pain, diarrhea, nausea and vomiting. Negative for anal bleeding, blood in stool and constipation.  Genitourinary: Negative for dysuria, urgency, vaginal bleeding and vaginal discharge.  Neurological: Negative for syncope.  All other systems reviewed and are negative.   Physical Exam Updated Vital Signs BP 139/80 (BP Location: Right Arm)   Pulse 86   Temp 98 F (36.7 C) (Oral)   Resp 16   LMP 09/15/2020   SpO2 100%   Physical Exam Vitals and nursing note reviewed.  Constitutional:      General: She is not in acute distress.    Appearance: She  is well-developed. She is not toxic-appearing.  HENT:     Head: Normocephalic and atraumatic.  Eyes:     General:        Right eye: No discharge.        Left eye: No discharge.     Conjunctiva/sclera: Conjunctivae normal.  Cardiovascular:     Rate and Rhythm: Normal rate and regular rhythm.  Pulmonary:     Effort: Pulmonary effort is normal. No respiratory distress.     Breath sounds: Normal breath sounds. No wheezing, rhonchi or rales.  Abdominal:     General: There is no distension.     Palpations: Abdomen is soft.     Tenderness: There is abdominal tenderness (mild generalized with increased tenderness to R mid to lower  abdomen). There is no guarding or rebound.  Musculoskeletal:     Cervical back: Neck supple.  Skin:    General: Skin is warm and dry.     Findings: No rash.  Neurological:     Mental Status: She is alert.     Comments: Clear speech.   Psychiatric:        Behavior: Behavior normal.     ED Results / Procedures / Treatments   Labs (all labs ordered are listed, but only abnormal results are displayed) Labs Reviewed  CBC WITH DIFFERENTIAL/PLATELET - Abnormal; Notable for the following components:      Result Value   RBC 5.12 (*)    Hemoglobin 10.9 (*)    MCV 72.9 (*)    MCH 21.3 (*)    MCHC 29.2 (*)    RDW 21.7 (*)    All other components within normal limits  URINALYSIS, ROUTINE W REFLEX MICROSCOPIC - Abnormal; Notable for the following components:   APPearance HAZY (*)    Leukocytes,Ua SMALL (*)    Bacteria, UA RARE (*)    All other components within normal limits  COMPREHENSIVE METABOLIC PANEL  LIPASE, BLOOD  I-STAT BETA HCG BLOOD, ED ([facility], [facility], [facility] ONLY)    EKG None  Radiology CT Abdomen Pelvis Wo Contrast  Result Date: 03/16/2021 CLINICAL DATA:  Nonlocalized generalized abdominal pain. Increased to right mid abdomen. History of ovarian cysts. EXAM: CT ABDOMEN AND PELVIS WITHOUT CONTRAST TECHNIQUE: Multidetector CT imaging of the abdomen and pelvis was performed following the standard protocol without IV contrast. COMPARISON:  CT abdomen pelvis 07/28/2020 FINDINGS: Lower chest: No acute abnormality.  Question tiny hiatal hernia. Hepatobiliary: Subcentimeter hypodensity is again noted. Otherwise no new focal liver abnormality. No gallstones, gallbladder wall thickening, or pericholecystic fluid. No biliary dilatation. Pancreas: No focal lesion. Normal pancreatic contour. No surrounding inflammatory changes. No main pancreatic ductal dilatation. Spleen: Normal in size without focal abnormality. Couple splenules noted within the left upper quadrant. Adrenals/Urinary Tract: No  adrenal nodule bilaterally. No nephrolithiasis, no hydronephrosis, and no contour-deforming renal mass. No ureterolithiasis or hydroureter. The urinary bladder is unremarkable. Stomach/Bowel: Stomach is within normal limits. No evidence of bowel wall thickening or dilatation. No inflammatory changes of the appendix with gas noted within its lumen and no periappendiceal fat stranding. Question punctate appendicolith within its lumen. Vascular/Lymphatic: No significant vascular findings are present. No enlarged abdominal or pelvic lymph nodes. Reproductive: Uterus and bilateral adnexa are unremarkable. Other: Trace simple free fluid within the pelvis. No intraperitoneal free gas. No organized fluid collection. Musculoskeletal: No acute or significant osseous findings. IMPRESSION: 1. No acute intra-abdominal or intrapelvic abnormality. 2. Possible tiny hiatal hernia. 3. The appendix is unchanged again noted to be  enlarged in caliber with no associated inflammatory changes to suggest acute appendicitis. Electronically Signed   By: Tish Frederickson M.D.   On: 03/16/2021 03:23    Procedures Procedures   Medications Ordered in ED Medications - No data to display  ED Course  I have reviewed the triage vital signs and the nursing notes.  Pertinent labs & imaging results that were available during my care of the patient were reviewed by me and considered in my medical decision making (see chart for details).    MDM Rules/Calculators/A&P                          Patient presents to the ED with complaints of abdominal pain and N/V/D. Nontoxic, vitals w/ mildly elevated BP- doubt HTN emergency. Mild generalized tenderness with most prominent being to right mid to lower abdomen. No peritoneal signs.   Additional history obtained:  Additional history obtained from chart review & nursing note review.   Lab Tests:  I Ordered, reviewed, and interpreted labs, which included:  CBC: Mild anemia similar to prior.   CMP: unremarkable.  Lipase: WNL UA: no urinary sxs to raise concern for UTI Preg test: negative  Imaging Studies ordered:  I ordered imaging studies which included CT A/P, I independently reviewed, formal radiology impression shows: 1. No acute intra-abdominal or intrapelvic abnormality. 2. Possible tiny hiatal hernia. 3. The appendix is unchanged again noted to be enlarged in caliber with no associated inflammatory changes to suggest acute appendicitis.  ED Course:   05:00: RE-EVAL: patient feeling better, she is tolerating PO without difficulty (fluids/crackers). On repeat abdominal exam patient remains without peritoneal signs, low suspicion for cholecystitis, pancreatitis, diverticulitis, appendicitis, bowel obstruction/perforation,  ectopic pregnancy, or other acute surgical process.  Will discharge home with supportive measures. PCP for general follow up, also recommended GYN follow up for lack of menstrual periods.  I discussed results, treatment plan, need for PCP follow-up, and return precautions with the patient. Provided opportunity for questions, patient confirmed understanding and is in agreement with plan.    Portions of this note were generated with Scientist, clinical (histocompatibility and immunogenetics). Dictation errors may occur despite best attempts at proofreading.   Final Clinical Impression(s) / ED Diagnoses Final diagnoses:  Nausea vomiting and diarrhea    Rx / DC Orders ED Discharge Orders         Ordered    ondansetron (ZOFRAN ODT) 4 MG disintegrating tablet  Every 8 hours PRN        03/16/21 0501    dicyclomine (BENTYL) 20 MG tablet  2 times daily PRN        03/16/21 0501           Nashira Mcglynn, Pleas Koch, PA-C 03/16/21 0350    Geoffery Lyons, MD 03/16/21 [phone]

## 2021-04-10 ENCOUNTER — Ambulatory Visit: Payer: Medicaid Other | Admitting: Internal Medicine

## 2021-04-30 ENCOUNTER — Encounter: Payer: Self-pay | Admitting: *Deleted

## 2021-08-25 IMAGING — CT CT ABD-PELV W/ CM
2 of 4 series · 16 of 46 positions shown, 18 images · IV contrast (APPLIED)
Comparison: None.

CLINICAL DATA: Right lower quadrant pain.

EXAM:
CT ABDOMEN AND PELVIS WITH CONTRAST
TECHNIQUE: Multidetector CT imaging of the abdomen and pelvis was performed
using the standard protocol following bolus administration of
intravenous contrast.
CONTRAST:  100mL OMNIPAQUE IOHEXOL 300 MG/ML  SOLN

[Series 3: abdomen 5.0 · axial · 0.95mm/px · z∈[+741,+1191]mm · 13 of 102 slices shown, 15 images]
[im 6/102  soft-tissue]
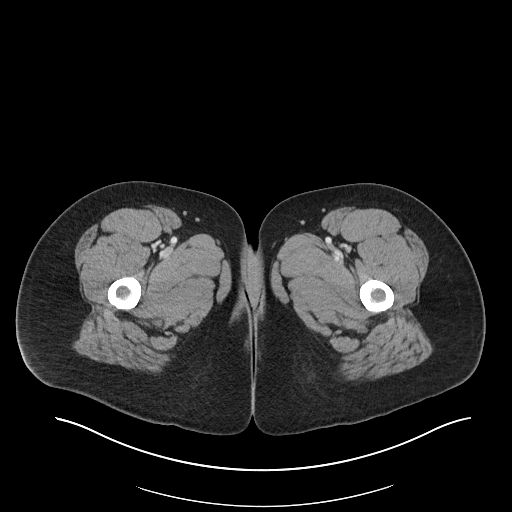
[im 6/102  bone]
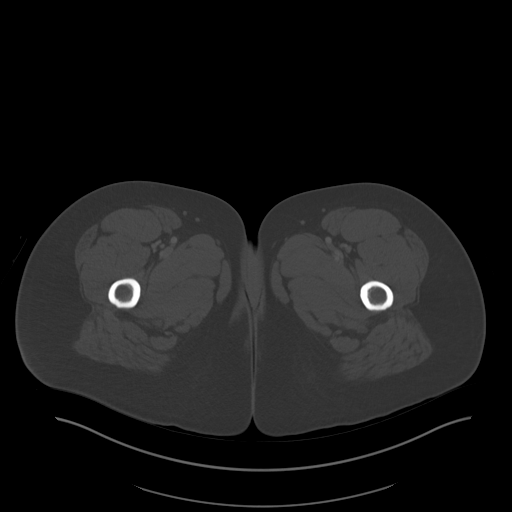
[im 12/102  soft-tissue]
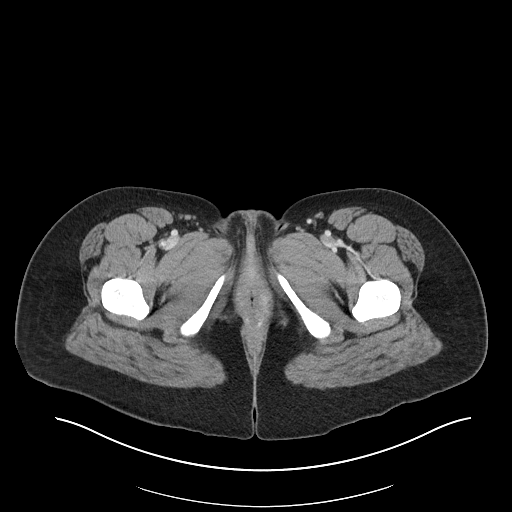
[im 24/102  soft-tissue]
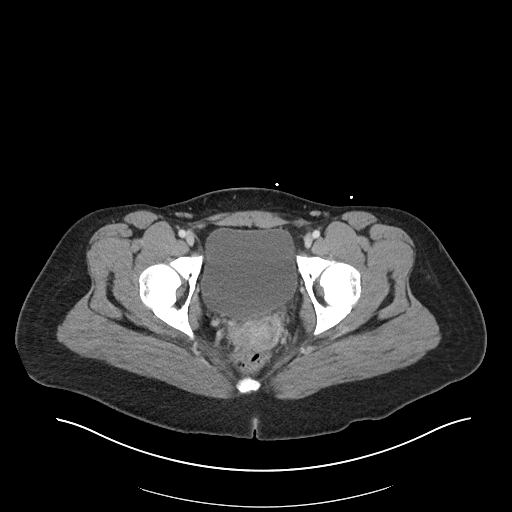
[im 30/102  soft-tissue]
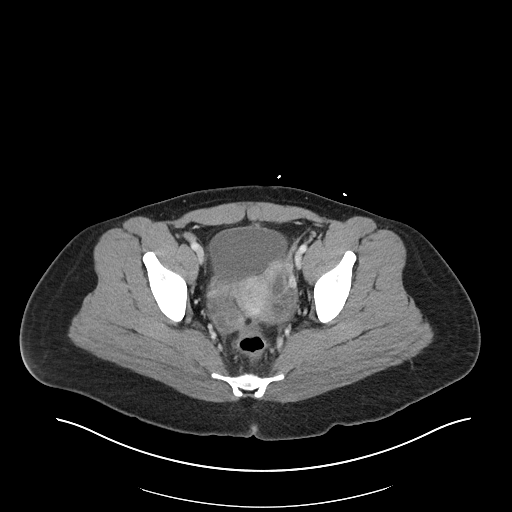
[im 36/102  soft-tissue]
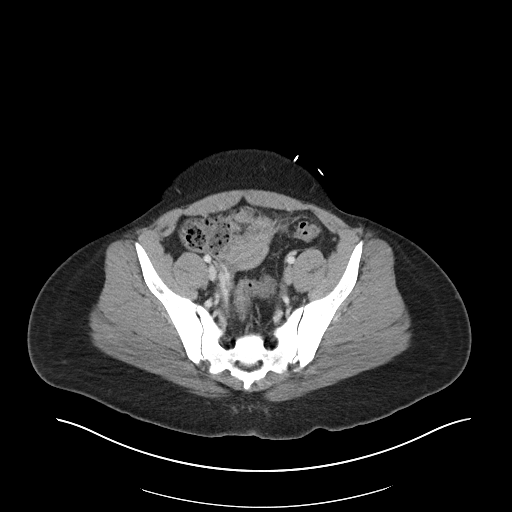
[im 42/102  soft-tissue]
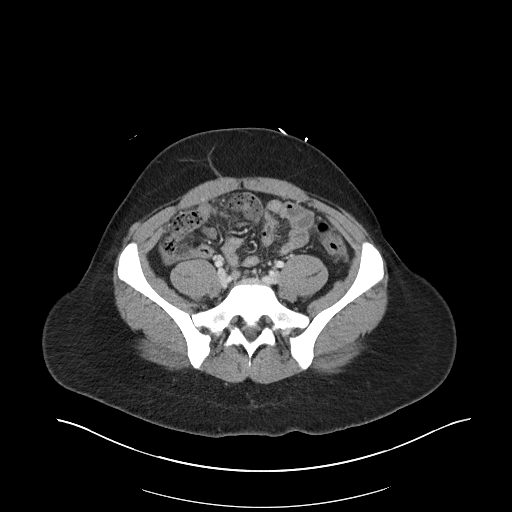
[im 54/102  soft-tissue]
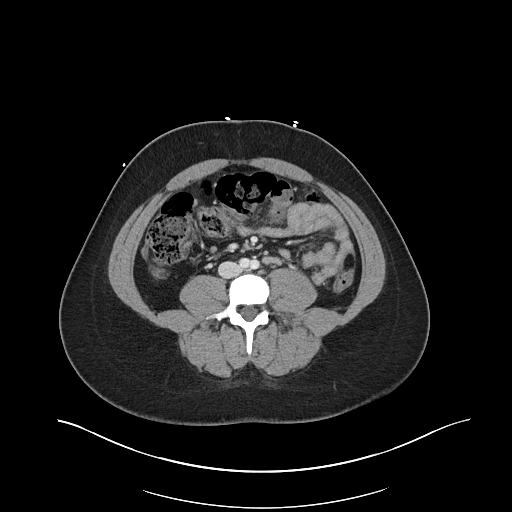
[im 60/102  soft-tissue]
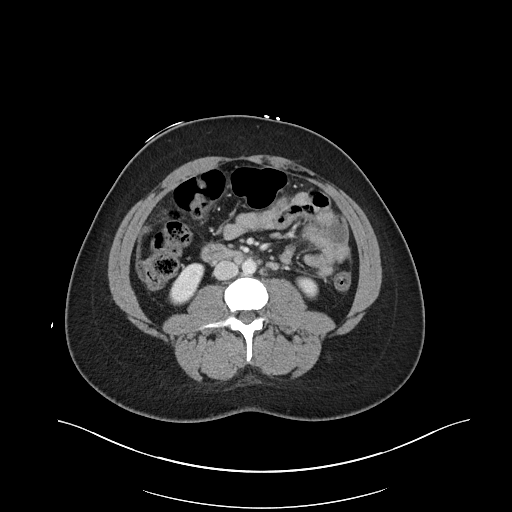
[im 66/102  soft-tissue]
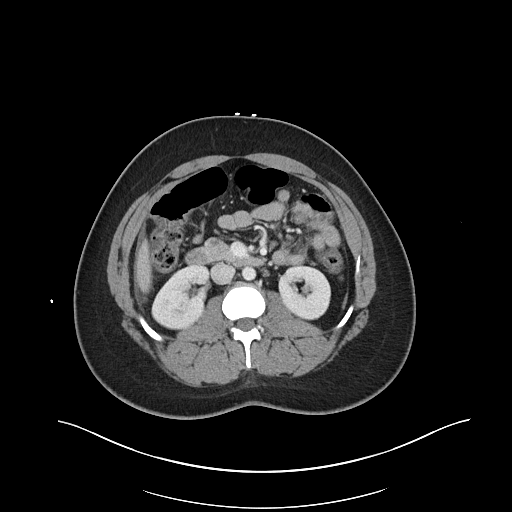
[im 66/102  bone]
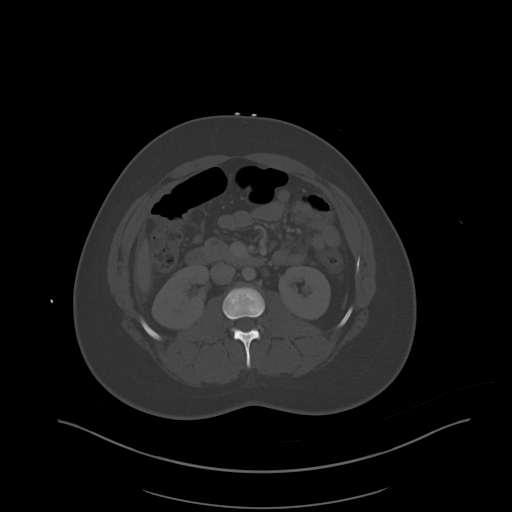
[im 72/102  soft-tissue]
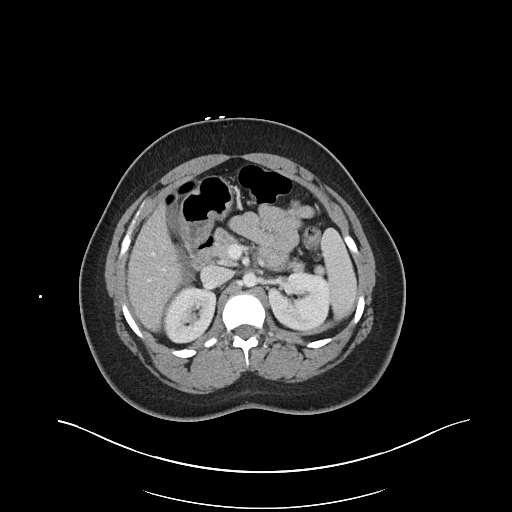
[im 78/102  soft-tissue]
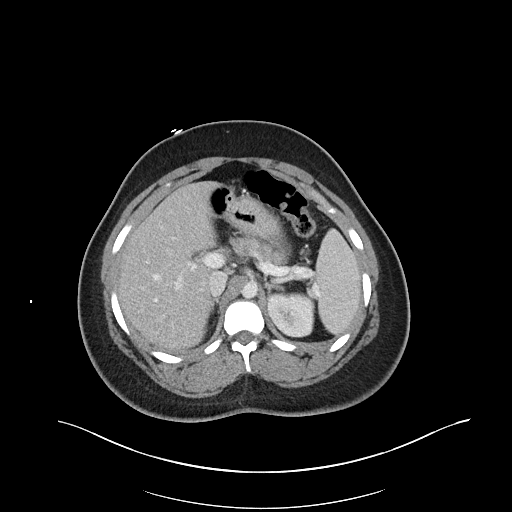
[im 90/102  soft-tissue]
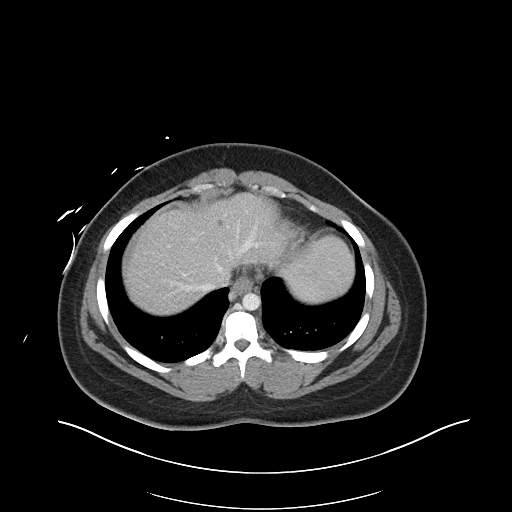
[im 96/102  soft-tissue]
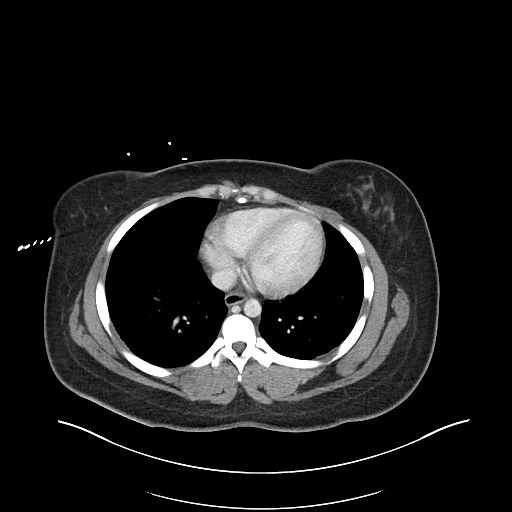

[Series 6: abdomen 3.0 mpr cor · coronal · 0.88mm/px · 3 of 114 slices shown]
[im 38/114  soft-tissue]
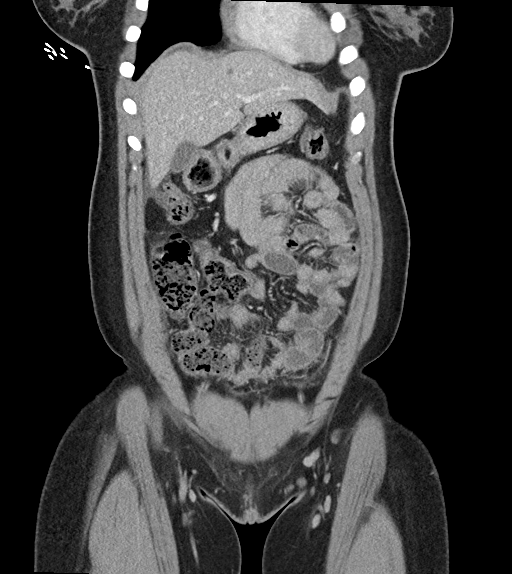
[im 51/114  soft-tissue]
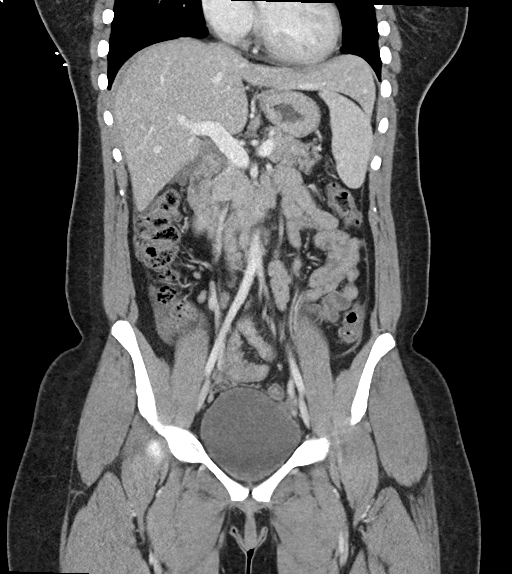
[im 63/114  soft-tissue]
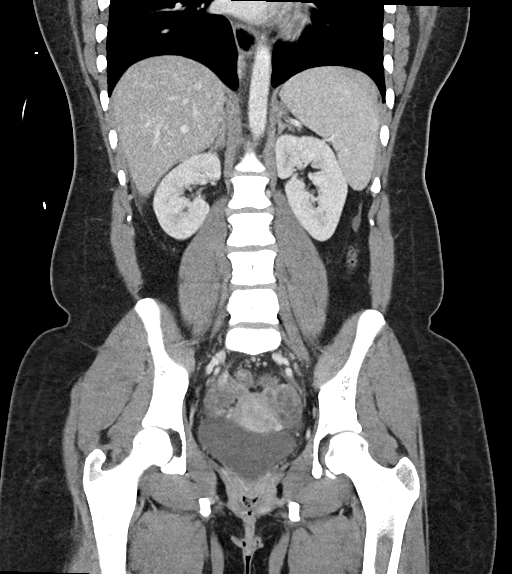

[16 of 46 positions shown; findings below may reference images not displayed]

FINDINGS: Lower chest: Mild linear atelectasis is seen within the left lung
base.

Hepatobiliary: There is diffuse fatty infiltration of the liver
parenchyma. 5 mm foci of parenchymal low attenuation are seen within
the liver dome and anterior aspect of the left lobe. A mild amount
of perihepatic fluid is seen, inferiorly. No gallstones, gallbladder
wall thickening, or biliary dilatation.

Pancreas: Unremarkable. No pancreatic ductal dilatation or
surrounding inflammatory changes.

Spleen: Normal in size without focal abnormality.

Adrenals/Urinary Tract: Adrenal glands are unremarkable. Kidneys are
normal, without renal calculi, focal lesion, or hydronephrosis. A
mild amount of inflammatory fat stranding is seen just above an
otherwise normal appearing urinary bladder.

Stomach/Bowel: Stomach is within normal limits. Appendix appears
normal. No evidence of bowel dilatation. A few mildly thickened
loops of distal small bowel are seen within the anterior aspect of
the pelvis.

Vascular/Lymphatic: No significant vascular findings are present.

Multiple subcentimeter mesenteric lymph nodes are seen within the
mid to lower abdomen.

Reproductive: The uterus is normal in size and appearance. Numerous
approximately 1 cm cystic and tubular appearing areas are seen
within the bilateral adnexa.

Other: No abdominal wall hernia or abnormality. No abdominopelvic
ascites.

A mild amount of anterior pelvic inflammatory fat stranding is seen.

Musculoskeletal: No acute or significant osseous findings.
IMPRESSION: 1. Multiple cystic and tubular appearing areas within the bilateral
adnexa which may represent ovarian cysts or dilated fallopian tubes.
Correlation with pelvic ultrasound is recommended.
2. Mild anterior pelvic inflammatory fat stranding which may
represent sequelae associated with pelvic inflammatory disease. Mild
enteritis in this region cannot be excluded.
3. Fatty liver with a mild amount of perihepatic fluid.
4. Multiple small hepatic cysts versus hemangiomas. Correlation with
hepatic ultrasound is recommended.

## 2022-05-14 ENCOUNTER — Encounter (HOSPITAL_COMMUNITY): Payer: Self-pay | Admitting: Emergency Medicine

## 2022-05-14 ENCOUNTER — Emergency Department (HOSPITAL_COMMUNITY)
Admission: EM | Admit: 2022-05-14 | Discharge: 2022-05-15 | Disposition: A | Discharge status: Other Acute Inpatient Hospital | Payer: Medicaid Other | Attending: Emergency Medicine | Admitting: Emergency Medicine

## 2022-05-14 DIAGNOSIS — F329 Major depressive disorder, single episode, unspecified: Secondary | ICD-10-CM | POA: Insufficient documentation

## 2022-05-14 DIAGNOSIS — Z20822 Contact with and (suspected) exposure to covid-19: Secondary | ICD-10-CM | POA: Insufficient documentation

## 2022-05-14 DIAGNOSIS — F32A Depression, unspecified: Secondary | ICD-10-CM

## 2022-05-14 DIAGNOSIS — R45851 Suicidal ideations: Secondary | ICD-10-CM | POA: Insufficient documentation

## 2022-05-14 LAB — CBC WITH DIFFERENTIAL/PLATELET
Abs Immature Granulocytes: 0.01 10*3/uL (ref 0.00–0.07)
Basophils Absolute: 0 10*3/uL (ref 0.0–0.1)
Basophils Relative: 1 %
Eosinophils Absolute: 0.1 10*3/uL (ref 0.0–0.5)
Eosinophils Relative: 1 %
HCT: 37.5 % (ref 36.0–46.0)
Hemoglobin: 12.2 g/dL (ref 12.0–15.0)
Immature Granulocytes: 0 %
Lymphocytes Relative: 38 %
Lymphs Abs: 2.1 10*3/uL (ref 0.7–4.0)
MCH: 25.5 pg — ABNORMAL LOW (ref 26.0–34.0)
MCHC: 32.5 g/dL (ref 30.0–36.0)
MCV: 78.3 fL — ABNORMAL LOW (ref 80.0–100.0)
Monocytes Absolute: 0.4 10*3/uL (ref 0.1–1.0)
Monocytes Relative: 7 %
Neutro Abs: 3 10*3/uL (ref 1.7–7.7)
Neutrophils Relative %: 53 %
Platelets: 187 10*3/uL (ref 150–400)
RBC: 4.79 MIL/uL (ref 3.87–5.11)
RDW: 14.7 % (ref 11.5–15.5)
WBC: 5.6 10*3/uL (ref 4.0–10.5)
nRBC: 0 % (ref 0.0–0.2)

## 2022-05-14 LAB — URINALYSIS, ROUTINE W REFLEX MICROSCOPIC
Bilirubin Urine: NEGATIVE
Glucose, UA: NEGATIVE mg/dL
Ketones, ur: NEGATIVE mg/dL
Nitrite: NEGATIVE
Protein, ur: NEGATIVE mg/dL
Specific Gravity, Urine: 1.027 (ref 1.005–1.030)
pH: 5 (ref 5.0–8.0)

## 2022-05-14 LAB — COMPREHENSIVE METABOLIC PANEL
ALT: 35 U/L (ref 0–44)
AST: 30 U/L (ref 15–41)
Albumin: 4.2 g/dL (ref 3.5–5.0)
Alkaline Phosphatase: 46 U/L (ref 38–126)
Anion gap: 8 (ref 5–15)
BUN: 6 mg/dL (ref 6–20)
CO2: 22 mmol/L (ref 22–32)
Calcium: 9.1 mg/dL (ref 8.9–10.3)
Chloride: 106 mmol/L (ref 98–111)
Creatinine, Ser: 0.9 mg/dL (ref 0.44–1.00)
GFR, Estimated: >60 mL/min (ref >60)
Glucose, Bld: 130 mg/dL — ABNORMAL HIGH (ref 70–99)
Potassium: 3.1 mmol/L — ABNORMAL LOW (ref 3.5–5.1)
Sodium: 136 mmol/L (ref 135–145)
Total Bilirubin: 0.9 mg/dL (ref 0.3–1.2)
Total Protein: 7.1 g/dL (ref 6.5–8.1)

## 2022-05-14 LAB — ETHANOL: Alcohol, Ethyl (B): <10 mg/dL (ref <10)

## 2022-05-14 LAB — PREGNANCY, URINE: Preg Test, Ur: NEGATIVE

## 2022-05-14 LAB — SALICYLATE LEVEL: Salicylate Lvl: <7 mg/dL — ABNORMAL LOW (ref 7.0–30.0)

## 2022-05-14 LAB — ACETAMINOPHEN LEVEL: Acetaminophen (Tylenol), Serum: <10 ug/mL — ABNORMAL LOW (ref 10–30)

## 2022-05-14 NOTE — ED Provider Triage Note (Signed)
Emergency Medicine Provider Triage Evaluation Note  Tammy Hayes , a 26 y.o. female  was evaluated in triage.  Pt complains of suicidal ideation.  Patient was sent by her primary care provider for further evaluation for her behavioral complaint.  Patient states that she has thoughts of harming herself and has created a plan to directly harm himself.  She denies homicidal ideation.  She denies auditory visual hallucinations. She has no active medical complaints.  No recent changes in medication  Review of Systems  Positive: See above Negative:   Physical Exam  There were no vitals taken for this visit. Gen:   Awake, no distress   Resp:  Normal effort  MSK:   Moves extremities without difficulty  Other:    Medical Decision Making  Medically screening exam initiated at 12:52 PM.  Appropriate orders placed.  Tammy Hayes was informed that the remainder of the evaluation will be completed by another provider, this initial triage assessment does not replace that evaluation, and the importance of remaining in the ED until their evaluation is complete.     Peter Garter, [place] 05/14/22 1253

## 2022-05-14 NOTE — ED Provider Notes (Signed)
[facility] EMERGENCY DEPARTMENT Provider Note   CSN: [id number] Arrival date & time: 05/14/22  1243    History  Chief Complaint  Patient presents with   Suicidal    Tammy Hayes is a 26 y.o. female here for evaluation of SI by cutting herself. Rx Doxy at PCP today for Chlamydia. PCP sent here for increased Depression and SI. Worse in April after job lost and haven't found job. Feels hopeless. No prior psych meds or admissions. No HI, AVH. Panic attacks 1-2 x week. Has been otherwise well at home. Occasional MJ use. No chronic etoh use. Aunt with Schizophrenia.  HPI     Home Medications Prior to Admission medications   Medication Sig Start Date End Date Taking? Authorizing Provider  doxycycline (VIBRA-TABS) 100 MG tablet Take 100 mg by mouth 2 (two) times daily. For 7 days   Yes [provider]  fluconazole (DIFLUCAN) 150 MG tablet Take 150 mg by mouth once. Take after finishing antibiotic   Yes [provider]      Allergies    Penicillins    Review of Systems   Review of Systems  Constitutional: Negative.   HENT: Negative.    Respiratory: Negative.    Cardiovascular: Negative.   Gastrointestinal: Negative.   Genitourinary: Negative.   Psychiatric/Behavioral:  Positive for sleep disturbance and suicidal ideas. Negative for hallucinations and self-injury. The patient is nervous/anxious. The patient is not hyperactive.   All other systems reviewed and are negative.   Physical Exam Updated Vital Signs BP 140/80 (BP Location: Left Arm)   Pulse 80   Temp 98.3 F (36.8 C) (Oral)   Resp 15   SpO2 100%  Physical Exam Vitals and nursing note reviewed.  Constitutional:      General: She is not in acute distress.    Appearance: She is well-developed. She is not ill-appearing, toxic-appearing or diaphoretic.  HENT:     Head: Atraumatic.  Eyes:     Pupils: Pupils are equal, round, and reactive to light.  Cardiovascular:     Rate and  Rhythm: Normal rate.     Pulses: Normal pulses.     Heart sounds: Normal heart sounds.  Pulmonary:     Effort: Pulmonary effort is normal. No respiratory distress.     Breath sounds: Normal breath sounds.  Abdominal:     General: Bowel sounds are normal. There is no distension.     Palpations: Abdomen is soft.  Musculoskeletal:        General: Normal range of motion.     Cervical back: Normal range of motion.  Skin:    General: Skin is warm and dry.     Capillary Refill: Capillary refill takes less than 2 seconds.  Neurological:     General: No focal deficit present.     Mental Status: She is alert and oriented to person, place, and time.  Psychiatric:        Attention and Perception: Attention and perception normal.        Mood and Affect: Mood normal. Affect is flat.        Speech: Speech normal.        Behavior: Behavior is withdrawn.        Thought Content: Thought content is not paranoid or delusional. Thought content includes suicidal ideation. Thought content does not include homicidal ideation. Thought content includes suicidal plan. Thought content does not include homicidal plan.     ED Results / Procedures /  Treatments   Labs (all labs ordered are listed, but only abnormal results are displayed) Labs Reviewed  COMPREHENSIVE METABOLIC PANEL - Abnormal; Notable for the following components:      Result Value   Potassium 3.1 (*)    Glucose, Bld 130 (*)    All other components within normal limits  CBC WITH DIFFERENTIAL/PLATELET - Abnormal; Notable for the following components:   MCV 78.3 (*)    MCH 25.5 (*)    All other components within normal limits  URINALYSIS, ROUTINE W REFLEX MICROSCOPIC - Abnormal; Notable for the following components:   APPearance HAZY (*)    Hgb urine dipstick SMALL (*)    Leukocytes,Ua SMALL (*)    Bacteria, UA RARE (*)    All other components within normal limits  ACETAMINOPHEN LEVEL - Abnormal; Notable for the following components:    Acetaminophen (Tylenol), Serum <10 (*)    All other components within normal limits  SALICYLATE LEVEL - Abnormal; Notable for the following components:   Salicylate Lvl <7.0 (*)    All other components within normal limits  SARS CORONAVIRUS 2 BY RT PCR  PREGNANCY, URINE  ETHANOL  RAPID URINE DRUG SCREEN, HOSP PERFORMED    EKG None  Radiology No results found.  Procedures Procedures    Medications Ordered in ED Medications - No data to display  ED Course/ Medical Decision Making/ A&P    26 year old here for evaluation of depression and SI with plan to cut self. Recent increase in depression due to loss of job. No prior Suicide attempts. No HI, AVH. Flat affect however appear otherwise well. Psych clearance labs.  Labs personally viewed and interpreted:  CBC without leukocytosis CMP mild hypokalemia at 3.1 UA neg for infection Pre neg Ethanol, salicylate, tylenol WNL   Patient medically cleared. Dispo per psych                           Medical Decision Making Amount and/or Complexity of Data Reviewed External Data Reviewed: labs and notes. Labs: ordered. Decision-making details documented in ED Course.  Risk Diagnosis or treatment significantly limited by social determinants of health.          Final Clinical Impression(s) / ED Diagnoses Final diagnoses:  Depression, unspecified depression type  Suicidal ideation    Rx / DC Orders ED Discharge Orders     None         Remberto Lienhard A, PA-C 05/14/22 2216    Gloris Manchester, MD 05/17/22 [phone]

## 2022-05-14 NOTE — ED Triage Notes (Signed)
Patient here with complaint of having suicidal thoughts with plan. Denies HI, denies AVH. Patient states she was at her PCP this morning and they told her to go to ED.

## 2022-05-15 ENCOUNTER — Ambulatory Visit (HOSPITAL_COMMUNITY)
Admission: EM | Admit: 2022-05-15 | Discharge: 2022-05-15 | Disposition: A | Discharge status: Home / Self Care | Payer: No Payment, Other | Attending: Psychiatry | Admitting: Psychiatry

## 2022-05-15 DIAGNOSIS — R45851 Suicidal ideations: Secondary | ICD-10-CM | POA: Insufficient documentation

## 2022-05-15 DIAGNOSIS — F4321 Adjustment disorder with depressed mood: Secondary | ICD-10-CM | POA: Diagnosis present

## 2022-05-15 DIAGNOSIS — N390 Urinary tract infection, site not specified: Secondary | ICD-10-CM | POA: Diagnosis present

## 2022-05-15 DIAGNOSIS — F1290 Cannabis use, unspecified, uncomplicated: Secondary | ICD-10-CM | POA: Insufficient documentation

## 2022-05-15 DIAGNOSIS — F32A Depression, unspecified: Secondary | ICD-10-CM | POA: Insufficient documentation

## 2022-05-15 LAB — SARS CORONAVIRUS 2 BY RT PCR: SARS Coronavirus 2 by RT PCR: NEGATIVE

## 2022-05-15 MED ORDER — FLUCONAZOLE 50 MG PO TABS
150.0000 mg | ORAL_TABLET | Freq: Once | ORAL | Status: AC
Start: 2022-05-15 — End: 2022-05-15
  Administered 2022-05-15: 150 mg via ORAL
  Filled 2022-05-15: qty 3

## 2022-05-15 MED ORDER — DOXYCYCLINE HYCLATE 100 MG PO TABS
100.0000 mg | ORAL_TABLET | Freq: Two times a day (BID) | ORAL | Status: DC
  Administered 2022-05-15: 100 mg via ORAL
  Filled 2022-05-15: qty 1

## 2022-05-15 MED ORDER — POTASSIUM CHLORIDE CRYS ER 10 MEQ PO TBCR
30.0000 meq | EXTENDED_RELEASE_TABLET | Freq: Two times a day (BID) | ORAL | Status: DC
  Administered 2022-05-15: 30 meq via ORAL
  Filled 2022-05-15: qty 1

## 2022-05-15 NOTE — ED Notes (Signed)
This RN reported to [facility] RN [employer]. Report received however patient can not be sent over until COVID swab results. COVID swab sent.

## 2022-05-15 NOTE — BH Assessment (Signed)
LCSW Progress Note   Per Augusto Gamble, MD, this pt does not require psychiatric hospitalization at this time.  Pt is psychiatrically cleared.  Discharge instructions include several resources for therapy and medication management, the mobile crisis number, and 211.  EDP Augusto Gamble, MD, has been notified.  Hansel Starling, MSW, LCSW [facility] [phone] or [phone]

## 2022-05-15 NOTE — Discharge Instructions (Addendum)
Medication Management and Therapy for No Insurance/IPRS Based on observation and your report, outpatient services with psychiatry and therapy have been recommended.  It is imperative to your mental health that seek out services 7-10 day from your day of discharge to prevent another crisis. A list of referrals has been provided below based on your needs and insurance coverage to get you started on finding the right provider for you.  You are not limited to the list provided.  In case of an urgent crisis, you may contact the Mobile Crisis Unit with [facility] at [phone].   You have the option to call 211 for assistance in finding additional mental health agencies if these do not meet your needs.   [facility] [address], [place], [address] [phone] phone   New Patient Assessment/Therapy Walk-Ins:  Monday and Wednesday: 8 am until slots are full. Every 1st and 2nd Fridays of the month: 1 pm - 5 pm.  NO ASSESSMENT/THERAPY WALK-INS ON TUESDAYS OR THURSDAYS  New Patient Assessment/Medication Management Walk-Ins:  Monday - Friday:  8 am - 11 am.  For all walk-ins, we ask that you arrive by 7:30 am because patients will be seen in the order of arrival.  Availability is limited; therefore, you may not be seen on the same day that you walk-in.  Our goal is to serve and meet the needs of our community to the best of our ability.         [facility]ist at [facility]      [address]      [place], [place], [address]      [phone] phone to set up initial appointment        [facility]      [address] [place], [place], [address]      [phone] phone      [phone] fax        [facility]     [address]     [place], [place], [address]     [phone] phone     [phone] fax    You can also check out the [facility] if you have not already.  They  usually serve those who have Medicaid coverage.  If you have documentation showing that you have an Intellectual Developmental Disability (IDD), then it would be a good idea to look into speaking with a Child psychotherapist at [place] to see if you are eligible for [employer].    [employer] [address] [place], [place], [address] [phone] phone for the intake and referral department Monday - Friday, 8am to 4pm    [email or web address]

## 2022-05-15 NOTE — ED Notes (Signed)
Patient updated on plan of care, receptive at this time.

## 2022-05-15 NOTE — Progress Notes (Signed)
Patient admitted to the observation unit due to increase depression and passive SI thoughts. Patient states, " I just want to get help because I don't want to hurt myself so I feel like I need some kind of treatment to help me get back to myself." Patient calm and cooperative on arrival, patient has a tattoo on left arm, healed scar on chest and left wrist. Patient received hygiene bag and scrubs, now showering. Nursing staff will continue to monitor.
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
## 2022-05-15 NOTE — Progress Notes (Addendum)
Patient received breakfast and scheduled medications given.

## 2022-05-15 NOTE — ED Notes (Signed)
Patient discharge with a summary which contains follow up instructions for outpatient services. Patient verbalized understanding. At time of discharge patient denies SI, HI and AVH. Patient discharge with family members.

## 2022-05-15 NOTE — ED Notes (Signed)
Pt was given a muffin and juice for breakfast.  

## 2022-05-20 ENCOUNTER — Encounter (HOSPITAL_COMMUNITY): Payer: Self-pay | Admitting: Registered Nurse

## 2022-05-20 ENCOUNTER — Other Ambulatory Visit (HOSPITAL_COMMUNITY)
Admission: EM | Admit: 2022-05-20 | Discharge: 2022-05-23 | Disposition: A | Discharge status: Home / Self Care | Payer: No Payment, Other | Attending: Psychiatry | Admitting: Psychiatry

## 2022-05-20 ENCOUNTER — Other Ambulatory Visit: Payer: Self-pay

## 2022-05-20 DIAGNOSIS — R45851 Suicidal ideations: Secondary | ICD-10-CM

## 2022-05-20 DIAGNOSIS — F332 Major depressive disorder, recurrent severe without psychotic features: Secondary | ICD-10-CM | POA: Diagnosis present

## 2022-05-20 DIAGNOSIS — Z79899 Other long term (current) drug therapy: Secondary | ICD-10-CM | POA: Insufficient documentation

## 2022-05-20 DIAGNOSIS — Z20822 Contact with and (suspected) exposure to covid-19: Secondary | ICD-10-CM | POA: Insufficient documentation

## 2022-05-20 DIAGNOSIS — F32A Depression, unspecified: Secondary | ICD-10-CM | POA: Diagnosis present

## 2022-05-20 LAB — POCT URINE DRUG SCREEN - MANUAL ENTRY (I-SCREEN)
POC Amphetamine UR: NOT DETECTED
POC Buprenorphine (BUP): NOT DETECTED
POC Cocaine UR: NOT DETECTED
POC Marijuana UR: POSITIVE — AB
POC Methadone UR: NOT DETECTED
POC Methamphetamine UR: NOT DETECTED
POC Morphine: NOT DETECTED
POC Oxazepam (BZO): NOT DETECTED
POC Oxycodone UR: NOT DETECTED
POC Secobarbital (BAR): NOT DETECTED

## 2022-05-20 LAB — URINALYSIS, ROUTINE W REFLEX MICROSCOPIC
Bilirubin Urine: NEGATIVE
Glucose, UA: NEGATIVE mg/dL
Ketones, ur: NEGATIVE mg/dL
Leukocytes,Ua: NEGATIVE
Nitrite: NEGATIVE
Protein, ur: NEGATIVE mg/dL
Specific Gravity, Urine: 1.02 (ref 1.005–1.030)
pH: 7 (ref 5.0–8.0)

## 2022-05-20 LAB — CBC WITH DIFFERENTIAL/PLATELET
Abs Immature Granulocytes: 0.01 10*3/uL (ref 0.00–0.07)
Basophils Absolute: 0.1 10*3/uL (ref 0.0–0.1)
Basophils Relative: 1 %
Eosinophils Absolute: 0 10*3/uL (ref 0.0–0.5)
Eosinophils Relative: 1 %
HCT: 38.7 % (ref 36.0–46.0)
Hemoglobin: 12.4 g/dL (ref 12.0–15.0)
Immature Granulocytes: 0 %
Lymphocytes Relative: 35 %
Lymphs Abs: 1.9 10*3/uL (ref 0.7–4.0)
MCH: 25.3 pg — ABNORMAL LOW (ref 26.0–34.0)
MCHC: 32 g/dL (ref 30.0–36.0)
MCV: 79 fL — ABNORMAL LOW (ref 80.0–100.0)
Monocytes Absolute: 0.4 10*3/uL (ref 0.1–1.0)
Monocytes Relative: 7 %
Neutro Abs: 3 10*3/uL (ref 1.7–7.7)
Neutrophils Relative %: 56 %
Platelets: 199 10*3/uL (ref 150–400)
RBC: 4.9 MIL/uL (ref 3.87–5.11)
RDW: 15.3 % (ref 11.5–15.5)
WBC: 5.4 10*3/uL (ref 4.0–10.5)
nRBC: 0 % (ref 0.0–0.2)

## 2022-05-20 LAB — RESP PANEL BY RT-PCR (FLU A&B, COVID) ARPGX2
Influenza A by PCR: NEGATIVE
Influenza B by PCR: NEGATIVE
SARS Coronavirus 2 by RT PCR: NEGATIVE

## 2022-05-20 LAB — PREGNANCY, URINE: Preg Test, Ur: NEGATIVE

## 2022-05-20 LAB — POCT PREGNANCY, URINE: Preg Test, Ur: NEGATIVE

## 2022-05-20 LAB — ETHANOL: Alcohol, Ethyl (B): <10 mg/dL (ref <10)

## 2022-05-20 LAB — POC SARS CORONAVIRUS 2 AG: SARSCOV2ONAVIRUS 2 AG: NEGATIVE

## 2022-05-20 MED ORDER — HYDROXYZINE HCL 25 MG PO TABS
25.0000 mg | ORAL_TABLET | Freq: Three times a day (TID) | ORAL | Status: DC | PRN
  Administered 2022-05-20 – 2022-05-22 (×3): 25 mg via ORAL
  Filled 2022-05-20 (×3): qty 1

## 2022-05-20 MED ORDER — FLUOXETINE HCL 20 MG PO CAPS
20.0000 mg | ORAL_CAPSULE | Freq: Every day | ORAL | Status: DC
  Administered 2022-05-20 – 2022-05-23 (×4): 20 mg via ORAL
  Filled 2022-05-20 (×3): qty 1
  Filled 2022-05-20: qty 7
  Filled 2022-05-20: qty 1

## 2022-05-20 MED ORDER — MAGNESIUM HYDROXIDE 400 MG/5ML PO SUSP: 30.0000 mL | Freq: Every day | ORAL | Status: DC | PRN

## 2022-05-20 MED ORDER — DOXYCYCLINE HYCLATE 100 MG PO TABS
100.0000 mg | ORAL_TABLET | Freq: Two times a day (BID) | ORAL | Status: DC
  Administered 2022-05-20 – 2022-05-23 (×6): 100 mg via ORAL
  Filled 2022-05-20 (×6): qty 1

## 2022-05-20 MED ORDER — ALUM & MAG HYDROXIDE-SIMETH 200-200-20 MG/5ML PO SUSP: 30.0000 mL | ORAL | Status: DC | PRN

## 2022-05-20 MED ORDER — ACETAMINOPHEN 325 MG PO TABS: 650.0000 mg | ORAL_TABLET | Freq: Four times a day (QID) | ORAL | Status: DC | PRN

## 2022-05-20 MED ORDER — TRAZODONE HCL 50 MG PO TABS
50.0000 mg | ORAL_TABLET | Freq: Every evening | ORAL | Status: DC | PRN
  Administered 2022-05-20 – 2022-05-22 (×3): 50 mg via ORAL
  Filled 2022-05-20 (×3): qty 1

## 2022-05-20 NOTE — BH Assessment (Addendum)
Comprehensive Clinical Assessment (CCA) Note  05/20/2022 Tammy Hayes [id number] Disposition: Patient was triaged by TTS counselor FT and this clinician completed the CCA.  Pt was seen for her MSE by Tammy Found, NP.  Tammy Hayes recommends overnight continuous assessment at [facility].  Pt has good eye contact and is oriented.  She answers questions clearly and coherently.  Pt is not responding to internal stimuli.  She does not evidence any delusional thought process.  Pt reports not having a good appetite over the last few days.  She has a hard time sleeping and staying asleep.    Pt has no current outpatient provider.   Chief Complaint:  Chief Complaint  Patient presents with   Depression   Suicidal   Visit Diagnosis: MDD recurrent, severe    CCA Screening, Triage and Referral (STR)  Patient Reported Information How did you hear about [place]? Self  What Is the Reason for Your Visit/Call Today? Passive SI, Worsening depression, SIB cutting Monday night  Pt said that when she is alone at home she has thoughts of killing herself.  In 2022 she tried to overdose on Tylenol, this went unreported.  Pt has no HI or A.V hallucinations.  Patient does use ETOH and THC often but not on a day to day basis.  No access to weapons.  She is stressed about being unemployed and not having a path to follow in life.  Pt says she has not been eating much.  Staying up all night and going to sleep in the daytime.  At this time patient does not feel safe returning home to be by herself.  How Long Has This Been Causing You Problems? 1-6 months  What Do You Feel Would Help You the Most Today? Treatment for Depression or other mood problem   Have You Recently Had Any Thoughts About Hurting Yourself? Yes  Are You Planning to Commit Suicide/Harm Yourself At This time? No   Have you Recently Had Thoughts About Hurting Someone Tammy Hayes? No  Are You Planning to Harm Someone at This Time? No  Explanation: No data  recorded  Have You Used Any Alcohol or Drugs in the Past 24 Hours? No  How Long Ago Did You Use Drugs or Alcohol? No data recorded What Did You Use and How Much? No data recorded  Do You Currently Have a Therapist/Psychiatrist? No  Name of Therapist/Psychiatrist: No data recorded  Have You Been Recently Discharged From Any Office Practice or Programs? No  Explanation of Discharge From Practice/Program: No data recorded    CCA Screening Triage Referral Assessment Type of Contact: Face-to-Face  Telemedicine Service Delivery:   Is this Initial or Reassessment? Initial Assessment  Date Telepsych consult ordered in [facility]:  05/14/22  Time Telepsych consult ordered in [facility]:  1727  Location of Assessment: [facility]  Provider Location: [facility] Assessment Services   Collateral Involvement: None currently   Does Patient Have a Automotive engineer Guardian? No data recorded Name and Contact of Legal Guardian: No data recorded If Minor and Not Living with Parent(s), Who has Custody? N/A  Is CPS involved or ever been involved? Never  Is APS involved or ever been involved? Never   Patient Determined To Be At Risk for Harm To Self or Others Based on Review of Patient Reported Information or Presenting Complaint? Yes, for Self-Harm  Method: No data recorded Availability of Means: No data recorded Intent: No data recorded Notification Required: No data recorded Additional Information  for Danger to Others Potential: No data recorded Additional Comments for Danger to Others Potential: No data recorded Are There Guns or Other Weapons in Your Home? No data recorded Types of Guns/Weapons: No data recorded Are These Weapons Safely Secured?                            No data recorded Who Could Verify You Are Able To Have These Secured: No data recorded Do You Have any Outstanding Charges, Pending Court Dates, Parole/Probation? No data recorded Contacted To Inform of Risk  of Harm To Self or Others: Family/Significant Other: (Pt's family is aware)    Does Patient Present under Involuntary Commitment? No  IVC Papers Initial File Date: No data recorded  [place] of Residence: [name]   Patient Currently Receiving the Following Services: Not Receiving Services   Determination of Need: Urgent (48 hours)   Options For Referral: [facility]     CCA Biopsychosocial Patient Reported Schizophrenia/Schizoaffective Diagnosis in Past: No   Strengths: Pt is actively seeking employment. She has consistent housing. Pt is able to answer the questions posed and express her thoughts, feelings, and concerns.   Mental Health Symptoms Depression:   Change in energy/activity; Difficulty Concentrating; Fatigue; Hopelessness; Worthlessness; Increase/decrease in appetite; Sleep (too much or little); Weight gain/loss   Duration of Depressive symptoms:    Mania:   None   Anxiety:    Worrying; Tension; Sleep; Difficulty concentrating; Fatigue   Psychosis:   None   Duration of Psychotic symptoms:    Trauma:   Difficulty staying/falling asleep; Avoids reminders of event   Obsessions:   None   Compulsions:   None   Inattention:   None   Hyperactivity/Impulsivity:   None   Oppositional/Defiant Behaviors:   None   Emotional Irregularity:   Potentially harmful impulsivity; Unstable self-image   Other Mood/Personality Symptoms:   None noted    Mental Status Exam Appearance and self-care  Stature:   Average   Weight:   Average weight   Clothing:   Casual   Grooming:   Well-groomed   Cosmetic use:   Age appropriate   Posture/gait:   Normal   Motor activity:   Not Remarkable   Sensorium  Attention:   Normal   Concentration:   Normal   Orientation:   X5   Recall/memory:   Normal   Affect and Mood  Affect:   Appropriate   Mood:   Depressed   Relating  Eye contact:   Normal   Facial expression:   Sad    Attitude toward examiner:   Cooperative   Thought and Language  Speech flow:  Clear and Coherent   Thought content:   Appropriate to Mood and Circumstances   Preoccupation:   Suicide   Hallucinations:   None   Organization:  No data recorded  [employer] of Knowledge:   Average   Intelligence:   Average   Abstraction:   Normal   Judgement:   Poor   Reality Testing:   Adequate   Insight:   Fair   Decision Making:   Impulsive   Social Functioning  Social Maturity:   Impulsive   Social Judgement:   Normal   Stress  Stressors:   Grief/losses; Work; Office manager Ability:   Deficient supports   Skill Deficits:   Self-control; Decision making   Supports:   Family     Religion:  Religion/Spirituality Are You A Religious Person?: No How Might This Affect Treatment?: Not assessed  Leisure/Recreation:    Exercise/Diet: Exercise/Diet Have You Gained or Lost A Significant Amount of Weight in the Past Six Months?: Yes-Gained Number of Pounds Gained:  (Unknown) Do You Follow a Special Diet?: No Do You Have Any Trouble Sleeping?: Yes Explanation of Sleeping Difficulties: Pt has difficulties staying asleep   CCA Employment/Education Employment/Work Situation:    Education:     CCA Family/Childhood History Family and Relationship History: Family history Long term relationship, how long?: For the last 8 months.  Childhood History:     Child/Adolescent Assessment:     CCA Substance Use Alcohol/Drug Use: Alcohol / Drug Use Pain Medications: None Prescriptions: Doxycycline, one tablet 2x/D for 7 days, started on 07/24 Over the Counter: None History of alcohol / drug use?: Yes Substance #1 Name of Substance 1: ETOH 1 - Age of First Use: 26 years of age 24 - Amount (size/oz): About 7 or more shots "untill I feel drunk" 1 - Frequency: Every other day 1 - Duration: ongoing 1 - Last Use / Amount: 07/18 1 - Method  of Aquiring: purchase 1- Route of Use: oral Substance #2 Name of Substance 2: Marijuana 2 - Age of First Use: Teens 2 - Amount (size/oz): One blunt 2 - Frequency: Couple times in a week 2 - Duration: ongong 2 - Last Use / Amount: Tuesday, May 13, 2022 2 - Method of Aquiring: Illegal purchase 2 - Route of Substance Use: Inhalation                     ASAM's:  Six Dimensions of Multidimensional Assessment  Dimension 1:  Acute Intoxication and/or Withdrawal Potential:      Dimension 2:  Biomedical Conditions and Complications:      Dimension 3:  Emotional, Behavioral, or Cognitive Conditions and Complications:     Dimension 4:  Readiness to Change:     Dimension 5:  Relapse, Continued use, or Continued Problem Potential:     Dimension 6:  Recovery/Living Environment:     ASAM Severity Score:    ASAM Recommended Level of Treatment:     Substance use Disorder (SUD)    Recommendations for Services/Supports/Treatments:    Discharge Disposition:    DSM5 Diagnoses: Patient Active Problem List   Diagnosis Date Noted   MDD (major depressive disorder), recurrent severe, without psychosis (HCC) 05/20/2022   Suicidal ideation 05/20/2022   Adjustment disorder with depressed mood 05/15/2022   Urinary tract infection without hematuria 05/15/2022   Symptomatic anemia 08/14/2020   Pancytopenia (HCC) 08/14/2020   Thrombocytopenia (HCC) 08/14/2020   HSIL (high grade squamous intraepithelial lesion) on Pap smear of cervix 05/09/2020   Depressive episode 05/09/2020   Hypertension 05/03/2019   Iron deficiency anemia 04/19/2019   Menorrhagia 04/19/2019   Prediabetes 07/06/2014   Acanthosis nigricans 05/09/2014   Learning disability 07/13/2013     Referrals to Alternative Service(s): Referred to Alternative Service(s):   Place:   Date:   Time:    Referred to Alternative Service(s):   Place:   Date:   Time:    Referred to Alternative Service(s):   Place:   Date:   Time:     Referred to Alternative Service(s):   Place:   Date:   Time:     Wandra Mannan

## 2022-05-20 NOTE — ED Notes (Signed)
Pt A&O x 4, presents with suicidal ideations, no plan noted.  Depression noted,  Unable to contract for safety.  Monitoring for safety.

## 2022-05-20 NOTE — BH Assessment (Signed)
Tammy Hayes reports worsening depression, passive SI and SIB of cutting on Monday. Pt unable to tell TTS what triggered SIB and SI however pt reports things are worse for her, she feels hopeless, overwhelmed, racing thoughts. Pt reports passive SI with continued thoughts to cut herself. Pt denies HI, AVH and reports THC use about two days ago.

## 2022-05-20 NOTE — ED Provider Notes (Signed)
[facility] Continuous Assessment Admission H&P  Date: 05/20/22 Patient Name: Tammy Hayes MRN: [id number] Chief Complaint:  Chief Complaint  Patient presents with   Depression   Suicidal      Diagnoses:  Final diagnoses:  MDD (major depressive disorder), recurrent severe, without psychosis ([place])  Suicidal ideation    HPI: Tammy Hayes 26 y.o., female patient presented to [facility] as a walk in with complaints of worsening depression, thoughts of self-ham, and suicidal thoughts  Tammy Hayes, 26 y.o., female patient seen face to face by this provider, consulted with Dr. Hampton Abbot; and chart reviewed on 05/20/22.  On evaluation Tammy Hayes reports that she feels that her depression is worsening and that she is unhappy with her life "I'm just not happy with my life.  I want to feel better, I just feel overwhelmed looking for a job, wanting to be independent and not rely on anyone else, being able to get things I need without asking anyone.  All of this just makes me depressed and then I start having thoughts of cutting myself and then having suicidal thoughts and not feeling myself."  Patient stats that she doesn't have a specific plan but is unable to contract for safety.  States if she is to go home tonight not sure if she will not hurt or try to kill herself.  Patient stats that she lives with her mother who is supportive.  States that she doesn't have outpatient psychiatric services but was recently discharged from [facility] related to suicidal thoughts and self harming behaviors.  States the last time she cut was on Monday.   Patient states she is interested in starting medication for depression.  Discussed starting Prozac.  Educated on efficacy and side effects During evaluation [name] is sitting upright in chair with no noted distress.  She is alert, oriented x 4, calm, cooperative and attentive.  Her mood is depressed with congruent affect.  She has normal speech, and behavior.   Objectively there is no evidence of psychosis/mania or delusional thinking.  Patient is able to converse coherently, goal directed thoughts, no distractibility, or pre-occupation.  She denies homicidal ideation, psychosis, and paranoia.  Continues to endorse passive suicidal thoughts with no intent or plan. PHQ 2-9:  [place] ED from 05/20/2022 in [facility] ED from 05/14/2022 in [place] Office Visit from 08/23/2020 in [place]  Thoughts that you would be better off dead, or of hurting yourself in some way Several days Several days Several days  PHQ-9 Total Score 11 16 16        Flowsheet Row ED from 05/20/2022 in [facility] ED from 05/14/2022 in [place] ED from 03/15/2021 in [place] Risk Moderate Risk No Risk        Total Time spent with patient: 45 minutes  Musculoskeletal  Strength & Muscle Tone: within normal limits Gait & Station: normal Patient leans: N/A  Psychiatric Specialty Exam  Presentation General Appearance: Appropriate for Environment; Casual  Eye Contact:Good  Speech:Clear and Coherent; Normal Rate  Speech Volume:Normal  Handedness:Right   Mood and Affect  Mood:Depressed  Affect:Congruent; Depressed   Thought Process  Thought Processes:Coherent; Goal Directed  Descriptions of Associations:Intact  Orientation:Full (Time, Place and Person)  Thought Content:Logical  Diagnosis of Schizophrenia or Schizoaffective disorder in past:  No   Hallucinations:Hallucinations: None  Ideas of Reference:None  Suicidal Thoughts:Suicidal Thoughts: Yes, Passive SI Passive Intent and/or Plan: Without Intent; Without Plan  Homicidal Thoughts:Homicidal Thoughts: No   Sensorium  Memory:Immediate Good; Recent  Good  Judgment:Fair  Insight:Fair; Present   Executive Functions  Concentration:Good  Attention Span:Good  Recall:Good  Fund of Knowledge:Good  Language:Good   Psychomotor Activity  Psychomotor Activity:No data recorded  Assets  Assets:Communication Skills; Desire for Improvement; Housing; Social Support   Sleep  Sleep:Sleep: Good   Nutritional Assessment (For OBS and FBC admissions only) Has the patient had a weight loss or gain of 10 pounds or more in the last 3 months?: No Has the patient had a decrease in food intake/or appetite?: No Does the patient have dental problems?: No Has the patient recently lost weight without trying?: 0 Has the patient been eating poorly because of a decreased appetite?: 0 Malnutrition Screening Tool Score: 0    Physical Exam Vitals and nursing note reviewed. Exam conducted with a chaperone present.  Constitutional:      General: She is not in acute distress.    Appearance: Normal appearance. She is not ill-appearing.  Cardiovascular:     Rate and Rhythm: Normal rate.  Pulmonary:     Effort: Pulmonary effort is normal.  Skin:    General: Skin is warm and dry.  Neurological:     Mental Status: She is alert and oriented to person, place, and time.  Psychiatric:        Attention and Perception: Attention and perception normal. She does not perceive auditory or visual hallucinations.        Mood and Affect: Mood is depressed.        Speech: Speech normal.        Behavior: Behavior normal. Behavior is cooperative.        Thought Content: Thought content is not paranoid or delusional. Thought content does not include homicidal ideation. Suicidal: Passive.Thought content does not include suicidal plan.        Cognition and Memory: Cognition and memory normal.        Judgment: Judgment is impulsive.    Review of Systems  Constitutional: Negative.   HENT: Negative.    Eyes: Negative.   Respiratory: Negative.    Cardiovascular:  Negative.   Gastrointestinal: Negative.   Genitourinary: Negative.   Musculoskeletal: Negative.   Skin: Negative.   Neurological: Negative.   Endo/Heme/Allergies: Negative.   Psychiatric/Behavioral:  Positive for depression and substance abuse (THC "every other day"). Negative for hallucinations. Suicidal ideas: Passive thoughts.The patient is nervous/anxious.     Blood pressure (!) 150/90, pulse 85, temperature 98.1 F (36.7 C), temperature source Oral, resp. rate 18, SpO2 100 %. There is no height or weight on file to calculate BMI.  Past Psychiatric History: Anxiety, MDD   Is the patient at risk to self? Yes  Has the patient been a risk to self in the past 6 months? Yes .    Has the patient been a risk to self within the distant past? Yes   Is the patient a risk to others? No   Has the patient been a risk to others in the past 6 months? No   Has the patient been a risk to others within the distant past? No   Past Medical History:  Past Medical History:  Diagnosis Date   Bilateral ovarian cysts    Pediatric overweight 07/13/2013   Pre-diabetes     Past Surgical History:  Procedure Laterality Date   EYE MUSCLE SURGERY     in 3rd grade. No reported residual weakness.     Family History:  Family History  Problem Relation Age of Onset   Depression Mother        grandparents, aunts/uncles.    Heart disease Other        aunts/uncles   Hypertension Other        aunts/uncles, grandparents   Kidney disease Other        aunts/uncles, grandparents   Stroke Other        aunts/uncles   Cancer Neg Hx     Social History:  Social History   Socioeconomic History   Marital status: Single    Spouse name: Not on file   Number of children: Not on file   Years of education: Not on file   Highest education level: Not on file  Occupational History   Not on file  Tobacco Use   Smoking status: Some Days    Packs/day: 0.05    Types: Cigarettes   Smokeless tobacco: Never   Substance and Sexual Activity   Alcohol use: Not Currently   Drug use: Not Currently    Types: Marijuana    Comment: Sometimes.   Sexual activity: Yes    Partners: Male  Other Topics Concern   Not on file  Social History Narrative   Lives with father 60% of the time. Student at [employer]-just finished 7th grade.    Christian per father.    Social Determinants of Health   Financial Resource Strain: Not on file  Food Insecurity: Not on file  Transportation Needs: Not on file  Physical Activity: Not on file  Stress: Not on file  Social Connections: Not on file  Intimate Partner Violence: Not on file    SDOH:  SDOH Screenings   Alcohol Screen: Not on file  Depression (PHQ2-9): Medium Risk (05/20/2022)   Depression (PHQ2-9)    PHQ-2 Score: 11  Financial Resource Strain: Not on file  Food Insecurity: Not on file  Housing: Not on file  Physical Activity: Not on file  Social Connections: Not on file  Stress: Not on file  Tobacco Use: High Risk (05/20/2022)   Patient History    Smoking Tobacco Use: Some Days    Smokeless Tobacco Use: Never    Passive Exposure: Not on file  Transportation Needs: Not on file    Last Labs:  Admission on 05/14/2022, Discharged on 05/15/2022  Component Date Value Ref Range Status   Sodium 05/14/2022 136  135 - 145 mmol/L Final   Potassium 05/14/2022 3.1 (L)  3.5 - 5.1 mmol/L Final   Chloride 05/14/2022 106  98 - 111 mmol/L Final   CO2 05/14/2022 22  22 - 32 mmol/L Final   Glucose, Bld 05/14/2022 130 (H)  70 - 99 mg/dL Final   Glucose reference range applies only to samples taken after fasting for at least 8 hours.   BUN 05/14/2022 6  6 - 20 mg/dL Final   Creatinine, Ser 05/14/2022 0.90  0.44 - 1.00 mg/dL Final   Calcium 16/10/960407/19/2023 9.1  8.9 - 10.3 mg/dL Final   Total Protein 54/09/811907/19/2023 7.1  6.5 - 8.1 g/dL Final   Albumin 14/78/295607/19/2023 4.2  3.5 - 5.0 g/dL Final   AST 21/30/865707/19/2023 30  15 - 41 U/L Final   ALT 05/14/2022 35  0 - 44 U/L  Final   Alkaline Phosphatase 05/14/2022 46  38 - 126 U/L  Final   Total Bilirubin 05/14/2022 0.9  0.3 - 1.2 mg/dL Final   GFR, Estimated 05/14/2022 >60  >60 mL/min Final   Comment: (NOTE) Calculated using the CKD-EPI Creatinine Equation (2021)    Anion gap 05/14/2022 8  5 - 15 Final   Performed at [facility] [address] [place], [place] [address]   WBC 05/14/2022 5.6  4.0 - 10.5 K/uL Final   RBC 05/14/2022 4.79  3.87 - 5.11 MIL/uL Final   Hemoglobin 05/14/2022 12.2  12.0 - 15.0 g/dL Final   HCT 05/14/2022 37.5  36.0 - 46.0 % Final   MCV 05/14/2022 78.3 (L)  80.0 - 100.0 fL Final   MCH 05/14/2022 25.5 (L)  26.0 - 34.0 pg Final   MCHC 05/14/2022 32.5  30.0 - 36.0 g/dL Final   RDW 05/14/2022 14.7  11.5 - 15.5 % Final   Platelets 05/14/2022 187  150 - 400 K/uL Final   nRBC 05/14/2022 0.0  0.0 - 0.2 % Final   Neutrophils Relative % 05/14/2022 53  % Final   Neutro Abs 05/14/2022 3.0  1.7 - 7.7 K/uL Final   Lymphocytes Relative 05/14/2022 38  % Final   Lymphs Abs 05/14/2022 2.1  0.7 - 4.0 K/uL Final   Monocytes Relative 05/14/2022 7  % Final   Monocytes Absolute 05/14/2022 0.4  0.1 - 1.0 K/uL Final   Eosinophils Relative 05/14/2022 1  % Final   Eosinophils Absolute 05/14/2022 0.1  0.0 - 0.5 K/uL Final   Basophils Relative 05/14/2022 1  % Final   Basophils Absolute 05/14/2022 0.0  0.0 - 0.1 K/uL Final   Immature Granulocytes 05/14/2022 0  % Final   Abs Immature Granulocytes 05/14/2022 0.01  0.00 - 0.07 K/uL Final   Performed at [facility], [place] [address], [place], [place] [address]   Preg Test, Ur 05/14/2022 NEGATIVE  NEGATIVE Final   Comment:        THE SENSITIVITY OF THIS METHODOLOGY IS >20 mIU/mL. Performed at [facility], [place] [address] [place], [place] [address]    Color, Urine 05/14/2022 YELLOW  YELLOW Final   APPearance 05/14/2022 HAZY (A)  CLEAR Final   Specific Gravity, Urine 05/14/2022 1.027  1.005 - 1.030 Final   pH 05/14/2022 5.0  5.0 - 8.0  Final   Glucose, UA 05/14/2022 NEGATIVE  NEGATIVE mg/dL Final   Hgb urine dipstick 05/14/2022 SMALL (A)  NEGATIVE Final   Bilirubin Urine 05/14/2022 NEGATIVE  NEGATIVE Final   Ketones, ur 05/14/2022 NEGATIVE  NEGATIVE mg/dL Final   Protein, ur 05/14/2022 NEGATIVE  NEGATIVE mg/dL Final   Nitrite 05/14/2022 NEGATIVE  NEGATIVE Final   Leukocytes,Ua 05/14/2022 SMALL (A)  NEGATIVE Final   RBC / HPF 05/14/2022 0-5  0 - 5 RBC/hpf Final   WBC, UA 05/14/2022 11-20  0 - 5 WBC/hpf Final   Bacteria, UA 05/14/2022 RARE (A)  NONE SEEN Final   Squamous Epithelial / LPF 05/14/2022 0-5  0 - 5 Final   Mucus 05/14/2022 PRESENT   Final   Performed at [facility], [place] [address] [place], [place] [address]   Acetaminophen (Tylenol), Serum 05/14/2022 <10 (L)  10 - 30 ug/mL Final   Comment: (NOTE) Therapeutic concentrations vary significantly. A range of 10-30 ug/mL  may be an effective concentration for many patients. However, some  are best treated at concentrations outside of this range. Acetaminophen concentrations >150 ug/mL at 4 hours after ingestion  and >50 ug/mL at  12 hours after ingestion are often associated with  toxic reactions.  Performed at [facility], [place] [address] [place], [place] [id number]    Salicylate Lvl 123XX123 <7.0 (L)  7.0 - 30.0 mg/dL Final   Performed at [facility] [address] [place], [place] [address]   Alcohol, Ethyl (B) 05/14/2022 <10  <10 mg/dL Final   Comment: (NOTE) Lowest detectable limit for serum alcohol is 10 mg/dL.  For medical purposes only. Performed at [facility], [place] [address] [place], [place] [address]    SARS Coronavirus 2 by RT PCR 05/15/2022 NEGATIVE  NEGATIVE Final   Comment: (NOTE) SARS-CoV-2 target nucleic acids are NOT DETECTED.  The SARS-CoV-2 RNA is generally detectable in upper and lower respiratory specimens during the acute phase of infection. The lowest concentration of SARS-CoV-2 viral copies this  assay can detect is 250 copies / mL. A negative result does not preclude SARS-CoV-2 infection and should not be used as the sole basis for treatment or other patient management decisions.  A negative result may occur with improper specimen collection / handling, submission of specimen other than nasopharyngeal swab, presence of viral mutation(s) within the areas targeted by this assay, and inadequate number of viral copies (<250 copies / mL). A negative result must be combined with clinical observations, patient history, and epidemiological information.  Fact Sheet for Patients:   https://www.patel.info/  Fact Sheet for Healthcare Providers: https://hall.com/  This test is not yet approved or                           cleared by the [place] FDA and has been authorized for detection and/or diagnosis of SARS-CoV-2 by FDA under an Emergency Use Authorization (EUA).  This EUA will remain in effect (meaning this test can be used) for the duration of the COVID-19 declaration under Section 564(b)(1) of the Act, 21 U.S.C. section 360bbb-3(b)(1), unless the authorization is terminated or revoked sooner.  Performed at [facility], [place] [address], [place], [place] [address]     Allergies: Penicillins  PTA Medications: (Not in a hospital admission)   Medical Decision Making  NARIYAH PHILOGENE was admitted to [facility]  continuous assessment unit for MDD (major depressive disorder), recurrent severe, without psychosis ([place]), crisis management, and stabilization. Routine labs ordered, which include Lab Orders         Resp Panel by RT-PCR (Flu A&B, Covid) Anterior Nasal Swab         CBC with Differential/Platelet         Comprehensive metabolic panel         Lipid panel         Ethanol         TSH         Urinalysis, Routine w reflex microscopic Urine, Clean Catch         Pregnancy, urine         POCT Urine  Drug Screen - (I-Screen)    Medication Management: Medications started Meds ordered this encounter  Medications   acetaminophen (TYLENOL) tablet 650 mg   alum & mag hydroxide-simeth (MAALOX/MYLANTA) 200-200-20 MG/5ML suspension 30 mL   magnesium hydroxide (MILK OF MAGNESIA) suspension 30 mL   hydrOXYzine (ATARAX) tablet 25 mg   traZODone (DESYREL) tablet 50 mg   FLUoxetine (PROZAC) capsule 20 mg    Will maintain continuous observation for safety. Social work will  consult with family for collateral information and discuss discharge and follow up plan.     Recommendations  Based on my evaluation the patient does not appear to have an emergency medical condition.  Logen Heintzelman, NP 05/20/22  7:32 PM

## 2022-05-20 NOTE — Discharge Instructions (Addendum)
  Outpatient psychiatric Services  Walk in hours for medication management Monday, Wednesday, Thursday, and Friday from 8:00 AM to 11:00 AM Recommend arriving by by 7:30 AM.  It is first come first serve.    Walk in hours for therapy intake Monday and Wednesday only 8:00 AM to 11:00 AM Encouraged to arrive by 7:30 AM.  It is first come first serve   Inpatient patient psychiatric services The [facility] offers comprehensive behavioral heath care services for mental health and substance abuse treatment.  Social work can also assist with referral to or getting you into a rehabilitation program short or long term     [facility] Address: [address] [place], [place], [place] [address] Phone: [phone]  Supported Employment The supported employment program is a person-centered, individualized, evidence-based support service that helps members choose, acquire, and maintain competitive employment in our community. This service supports the varying needs of individuals and promotes community inclusion and employment success. Members enrolled in the supported employment program can expect the following:  Development of an individual career plan Community based job placement Job shadowing Job development On-site job Furniture conservator/restorer and support  Supported Education Supported education helps our members receive the education and training they need to achieve their learning and recovery goals. This will assist members with becoming gainfully employed in the job or career of their choice. The program includes assistance with: Registering for disability accommodations Enrolling in school and registering for classes Learning communication skills Scheduling tutoring sessions within your school [facility] partners with Vocational [employer] to help increase the success of clients seeking employment and educational goals.  Want to learn more about our programs?    Please contact our intake department INTAKE: [phone] Ext [phone]  Mailing: [address] [address]   [place], [place] [address]   [email or web address]

## 2022-05-21 DIAGNOSIS — F332 Major depressive disorder, recurrent severe without psychotic features: Secondary | ICD-10-CM | POA: Diagnosis not present

## 2022-05-21 DIAGNOSIS — Z79899 Other long term (current) drug therapy: Secondary | ICD-10-CM | POA: Diagnosis not present

## 2022-05-21 DIAGNOSIS — R45851 Suicidal ideations: Secondary | ICD-10-CM | POA: Diagnosis not present

## 2022-05-21 DIAGNOSIS — F32A Depression, unspecified: Secondary | ICD-10-CM | POA: Diagnosis present

## 2022-05-21 DIAGNOSIS — Z20822 Contact with and (suspected) exposure to covid-19: Secondary | ICD-10-CM | POA: Diagnosis not present

## 2022-05-21 LAB — COMPREHENSIVE METABOLIC PANEL
ALT: 43 U/L (ref 0–44)
AST: 40 U/L (ref 15–41)
Albumin: 4.5 g/dL (ref 3.5–5.0)
Alkaline Phosphatase: 49 U/L (ref 38–126)
Anion gap: 10 (ref 5–15)
BUN: 9 mg/dL (ref 6–20)
CO2: 24 mmol/L (ref 22–32)
Calcium: 9.4 mg/dL (ref 8.9–10.3)
Chloride: 107 mmol/L (ref 98–111)
Creatinine, Ser: 0.78 mg/dL (ref 0.44–1.00)
GFR, Estimated: >60 mL/min (ref >60)
Glucose, Bld: 86 mg/dL (ref 70–99)
Potassium: 3.6 mmol/L (ref 3.5–5.1)
Sodium: 141 mmol/L (ref 135–145)
Total Bilirubin: 0.4 mg/dL (ref 0.3–1.2)
Total Protein: 7.9 g/dL (ref 6.5–8.1)

## 2022-05-21 LAB — LIPID PANEL
Cholesterol: 139 mg/dL (ref 0–200)
HDL: 54 mg/dL (ref >40)
LDL Cholesterol: 72 mg/dL (ref 0–99)
Total CHOL/HDL Ratio: 2.6 RATIO
Triglycerides: 65 mg/dL (ref <150)
VLDL: 13 mg/dL (ref 0–40)

## 2022-05-21 LAB — TSH: TSH: 0.361 u[IU]/mL (ref 0.350–4.500)

## 2022-05-21 NOTE — ED Notes (Signed)
Pt sleeping at present, no distress noted.  Monitoring for safety. 

## 2022-05-21 NOTE — ED Notes (Signed)
Pt sleeping quietly in room.  Breathing even and unlabored . No distress noted.  

## 2022-05-21 NOTE — ED Notes (Signed)
Pt laying in bed calm and cooperative. No c/o pain or stress. Will continue to monitor for safety

## 2022-05-21 NOTE — ED Notes (Signed)
Pt refused group today.

## 2022-05-21 NOTE — ED Notes (Signed)
Pt was given dinner. 

## 2022-05-21 NOTE — Progress Notes (Signed)
Received Tammy Hayes from [facility], alert and oriented x 4. She was cooperative with the admission process. She endorsed feeling anxious and depressed with passive suicidal thoughts of taking pills. She was oriented to her new environment and made comfortable.

## 2022-05-21 NOTE — ED Notes (Signed)
Pt is awake and alert. Flat affect and depressed mood.   When asked pt states "yes" for suicidal thoughts. But denies any plan at this time. Pt denies HI or AVH.  Pt given a breakfast muffin and spoke with provider.  She is to be transferred to [facility].  Currently sitting up in bed in no distress. Will continue to monitor for safety.

## 2022-05-21 NOTE — ED Provider Notes (Signed)
Facility Based Crisis Admission H&P  Date: 05/21/22 Patient Name: Tammy Hayes MRN: [id number] Chief Complaint:  Chief Complaint  Patient presents with   Depression   Suicidal     Diagnoses:  Final diagnoses:  MDD (major depressive disorder), recurrent severe, without psychosis ([place])  Suicidal ideation   HPI:   Pt w/ reported hx of depression.  Pt reassessed by nurse practitioner today. Pt reports depressed, anxious mood. Pt reports she presented to this facility yesterday due to "crisis" occurring on Monday. States she was feeling overwhelmed and used an eyebrow shaver to cut herself. Observed superficial lacerations on pt's bilateral forearms. Pt reports experiencing chronic passive suicidal ideations. She denies recent active suicidal ideations, although reports in the past has had plans of killing herself by cutting herself or overdosing on medications. Pt reports hx of 1 SA in 2022, states she took 15 tylenol pills. She denies hx of inpatient psychiatric hospitalization. Pt denies current suicidal ideations, homicidal ideations, or violent ideations. Pt denies auditory visual hallucinations or paranoia. Pt is not currently attending counseling or receiving medication management. When asked if pt is interested in these services, she reports she is, although has never made an appointment. Pt feels she needs further crisis stabilization prior to discharge. Discussed w/ pt transfer to facility based crisis, and pt is in agreement.   PHQ 2-9:  [place] ED from 05/20/2022 in [facility] ED from 05/14/2022 in [place] Office Visit from 08/23/2020 in [place]  Thoughts that you would be better off dead, or of hurting yourself in some way Several days Several days Several days  PHQ-9 Total Score 11 16 16        Flowsheet Row ED from 05/20/2022 in [facility] ED from  05/14/2022 in [place] ED from 03/15/2021 in [place] High Risk Moderate Risk No Risk        Total Time spent with patient: 15 minutes  Musculoskeletal  Strength & Muscle Tone: within normal limits Gait & Station: normal Patient leans: N/A  Psychiatric Specialty Exam  Presentation General Appearance: Casual; Appropriate for Environment  Eye Contact:Good  Speech:Clear and Coherent  Speech Volume:Normal  Handedness:Right   Mood and Affect  Mood:Depressed; Anxious  Affect:Flat   Thought Process  Thought Processes:Coherent; Goal Directed; Linear  Descriptions of Associations:Intact  Orientation:Full (Time, Place and Person)  Thought Content:Logical  Diagnosis of Schizophrenia or Schizoaffective disorder in past: No   Hallucinations:Hallucinations: None  Ideas of Reference:None  Suicidal Thoughts:Suicidal Thoughts: No  Homicidal Thoughts:Homicidal Thoughts: No   Sensorium  Memory:Immediate Good  Judgment:Fair  Insight:Fair   Executive Functions  Concentration:Fair  Attention Span:Good  [place]  Language:Good   Psychomotor Activity  Psychomotor Activity:Psychomotor Activity: Normal   Assets  Assets:Communication Skills; Desire for Improvement; Social Support   Sleep  Sleep:Sleep: Fair   Nutritional Assessment (For OBS and FBC admissions only) Has the patient had a weight loss or gain of 10 pounds or more in the last 3 months?: No Has the patient had a decrease in food intake/or appetite?: No Does the patient have dental problems?: No Has the patient recently lost weight without trying?: 0 Has the patient been eating poorly because of a decreased appetite?: 0 Malnutrition Screening Tool Score: 0    Physical Exam Cardiovascular:     Rate and Rhythm: Normal rate.  Pulmonary:     Effort: Pulmonary  effort is normal.  Skin:    Comments: Superficial lacerations on pt's b/l forearms  Neurological:     Mental Status: She is alert and oriented to person, place, and time.    Review of Systems  Constitutional:  Negative for chills and fever.  Respiratory:  Negative for shortness of breath.   Cardiovascular:  Negative for chest pain and palpitations.  Gastrointestinal:  Negative for abdominal pain.  Neurological:  Negative for dizziness and headaches.  Psychiatric/Behavioral:  Positive for depression. The patient is nervous/anxious.     Blood pressure (!) 107/58, pulse 87, temperature 98.4 F (36.9 C), temperature source Oral, resp. rate 18, SpO2 100 %. There is no height or weight on file to calculate BMI.  Past Psychiatric History: Pt reported past hx of depression  Is the patient at risk to self? No  Has the patient been a risk to self in the past 6 months? No .    Has the patient been a risk to self within the distant past? Yes   Is the patient a risk to others? No   Has the patient been a risk to others in the past 6 months? No   Has the patient been a risk to others within the distant past? No   Past Medical History:  Past Medical History:  Diagnosis Date   Bilateral ovarian cysts    Pediatric overweight 07/13/2013   Pre-diabetes     Past Surgical History:  Procedure Laterality Date   EYE MUSCLE SURGERY     in 3rd grade. No reported residual weakness.     Family History:  Family History  Problem Relation Age of Onset   Depression Mother        grandparents, aunts/uncles.    Heart disease Other        aunts/uncles   Hypertension Other        aunts/uncles, grandparents   Kidney disease Other        aunts/uncles, grandparents   Stroke Other        aunts/uncles   Cancer Neg Hx     Social History:  Social History   Socioeconomic History   Marital status: Single    Spouse name: Not on file   Number of children: Not on file   Years of education: Not on file    Highest education level: Not on file  Occupational History   Not on file  Tobacco Use   Smoking status: Some Days    Packs/day: 0.05    Types: Cigarettes   Smokeless tobacco: Never  Substance and Sexual Activity   Alcohol use: Not Currently   Drug use: Not Currently    Types: Marijuana    Comment: Sometimes.   Sexual activity: Yes    Partners: Male  Other Topics Concern   Not on file  Social History Narrative   Lives with father 60% of the time. Student at [employer] finished 7th grade.    Christian per father.    Social Determinants of Health   Financial Resource Strain: Not on file  Food Insecurity: Not on file  Transportation Needs: Not on file  Physical Activity: Not on file  Stress: Not on file  Social Connections: Not on file  Intimate Partner Violence: Not on file    SDOH:  SDOH Screenings   Alcohol Screen: Not on file  Depression (PHQ2-9): Medium Risk (05/20/2022)   Depression (PHQ2-9)    PHQ-2 Score: 11  Financial Resource Strain: Not on file  Food Insecurity: Not on file  Housing: Not on file  Physical Activity: Not on file  Social Connections: Not on file  Stress: Not on file  Tobacco Use: High Risk (05/20/2022)   Patient History    Smoking Tobacco Use: Some Days    Smokeless Tobacco Use: Never    Passive Exposure: Not on file  Transportation Needs: Not on file    Last Labs:  Admission on 05/20/2022  Component Date Value Ref Range Status   SARS Coronavirus 2 by RT PCR 05/20/2022 NEGATIVE  NEGATIVE Final   Comment: (NOTE) SARS-CoV-2 target nucleic acids are NOT DETECTED.  The SARS-CoV-2 RNA is generally detectable in upper respiratory specimens during the acute phase of infection. The lowest concentration of SARS-CoV-2 viral copies this assay can detect is 138 copies/mL. A negative result does not preclude SARS-Cov-2 infection and should not be used as the sole basis for treatment or other patient management decisions. A  negative result may occur with  improper specimen collection/handling, submission of specimen other than nasopharyngeal swab, presence of viral mutation(s) within the areas targeted by this assay, and inadequate number of viral copies(<138 copies/mL). A negative result must be combined with clinical observations, patient history, and epidemiological information. The expected result is Negative.  Fact Sheet for Patients:  BloggerCourse.com  Fact Sheet for Healthcare Providers:  SeriousBroker.it  This test is no                          t yet approved or cleared by the [place] FDA and  has been authorized for detection and/or diagnosis of SARS-CoV-2 by FDA under an Emergency Use Authorization (EUA). This EUA will remain  in effect (meaning this test can be used) for the duration of the COVID-19 declaration under Section 564(b)(1) of the Act, 21 U.S.C.section 360bbb-3(b)(1), unless the authorization is terminated  or revoked sooner.       Influenza A by PCR 05/20/2022 NEGATIVE  NEGATIVE Final   Influenza B by PCR 05/20/2022 NEGATIVE  NEGATIVE Final   Comment: (NOTE) The Xpert Xpress SARS-CoV-2/FLU/RSV plus assay is intended as an aid in the diagnosis of influenza from Nasopharyngeal swab specimens and should not be used as a sole basis for treatment. Nasal washings and aspirates are unacceptable for Xpert Xpress SARS-CoV-2/FLU/RSV testing.  Fact Sheet for Patients: BloggerCourse.com  Fact Sheet for Healthcare Providers: SeriousBroker.it  This test is not yet approved or cleared by the [place] FDA and has been authorized for detection and/or diagnosis of SARS-CoV-2 by FDA under an Emergency Use Authorization (EUA). This EUA will remain in effect (meaning this test can be used) for the duration of the COVID-19 declaration under Section 564(b)(1) of the Act, 21  U.S.C. section 360bbb-3(b)(1), unless the authorization is terminated or revoked.  Performed at [facility], [address] [place], [place] [address]    WBC 05/20/2022 5.4  4.0 - 10.5 K/uL Final   RBC 05/20/2022 4.90  3.87 - 5.11 MIL/uL Final   Hemoglobin 05/20/2022 12.4  12.0 - 15.0 g/dL Final   HCT 13/24/4010 38.7  36.0 - 46.0 % Final   MCV 05/20/2022 79.0 (L)  80.0 - 100.0 fL Final   MCH 05/20/2022 25.3 (L)  26.0 - 34.0 pg Final   MCHC 05/20/2022 32.0  30.0 - 36.0 g/dL Final   RDW 27/25/3664 15.3  11.5 - 15.5 % Final   Platelets 05/20/2022 199  150 - 400 K/uL Final   nRBC 05/20/2022 0.0  0.0 - 0.2 % Final   Neutrophils Relative % 05/20/2022 56  % Final   Neutro Abs 05/20/2022 3.0  1.7 - 7.7 K/uL Final   Lymphocytes Relative 05/20/2022 35  % Final   Lymphs Abs 05/20/2022 1.9  0.7 - 4.0 K/uL Final   Monocytes Relative 05/20/2022 7  % Final   Monocytes Absolute 05/20/2022 0.4  0.1 - 1.0 K/uL Final   Eosinophils Relative 05/20/2022 1  % Final   Eosinophils Absolute 05/20/2022 0.0  0.0 - 0.5 K/uL Final   Basophils Relative 05/20/2022 1  % Final   Basophils Absolute 05/20/2022 0.1  0.0 - 0.1 K/uL Final   Immature Granulocytes 05/20/2022 0  % Final   Abs Immature Granulocytes 05/20/2022 0.01  0.00 - 0.07 K/uL Final   Performed at [facility], [place] [address], [place], [place] [address]   Sodium 05/20/2022 141  135 - 145 mmol/L Final   Potassium 05/20/2022 3.6  3.5 - 5.1 mmol/L Final   Chloride 05/20/2022 107  98 - 111 mmol/L Final   CO2 05/20/2022 24  22 - 32 mmol/L Final   Glucose, Bld 05/20/2022 86  70 - 99 mg/dL Final   Glucose reference range applies only to samples taken after fasting for at least 8 hours.   BUN 05/20/2022 9  6 - 20 mg/dL Final   Creatinine, Ser 05/20/2022 0.78  0.44 - 1.00 mg/dL Final   Calcium 05/20/2022 9.4  8.9 - 10.3 mg/dL Final   Total Protein 05/20/2022 7.9  6.5 - 8.1 g/dL Final   Albumin 05/20/2022 4.5  3.5 - 5.0 g/dL Final   AST  05/20/2022 40  15 - 41 U/L Final   ALT 05/20/2022 43  0 - 44 U/L Final   Alkaline Phosphatase 05/20/2022 49  38 - 126 U/L Final   Total Bilirubin 05/20/2022 0.4  0.3 - 1.2 mg/dL Final   GFR, Estimated 05/20/2022 >60  >60 mL/min Final   Comment: (NOTE) Calculated using the CKD-EPI Creatinine Equation (2021)    Anion gap 05/20/2022 10  5 - 15 Final   Performed at [facility] [address] [place], [place] [address]   Cholesterol 05/20/2022 139  0 - 200 mg/dL Final   Triglycerides 05/20/2022 65  <150 mg/dL Final   HDL 05/20/2022 54  >40 mg/dL Final   Total CHOL/HDL Ratio 05/20/2022 2.6  RATIO Final   VLDL 05/20/2022 13  0 - 40 mg/dL Final   LDL Cholesterol 05/20/2022 72  0 - 99 mg/dL Final   Comment:        Total Cholesterol/HDL:CHD Risk Coronary Heart Disease Risk Table                     Men   Women  1/2 Average Risk   3.4   3.3  Average Risk       5.0   4.4  2 X Average Risk   9.6   7.1  3 X Average Risk  23.4   11.0        Use the calculated Patient Ratio above and the CHD Risk Table to determine the patient's CHD Risk.        ATP III CLASSIFICATION (LDL):  <100     mg/dL   Optimal  100-129  mg/dL   Near or Above  Optimal  130-159  mg/dL   Borderline  160-189  mg/dL   High  >190     mg/dL   Very High Performed at [facility] [address], [place], [place] [address]    Alcohol, Ethyl (B) 05/20/2022 <10  <10 mg/dL Final   Comment: (NOTE) Lowest detectable limit for serum alcohol is 10 mg/dL.  For medical purposes only. Performed at [facility], [place] [address] [place], [place] [address]    TSH 05/20/2022 0.361  0.350 - 4.500 uIU/mL Final   Comment: Performed by a 3rd Generation assay with a functional sensitivity of <=0.01 uIU/mL. Performed at [facility], [place] [address], [place], [place] [address]    Color, Urine 05/20/2022 YELLOW  YELLOW Final   APPearance 05/20/2022 HAZY (A)  CLEAR Final   Specific Gravity,  Urine 05/20/2022 1.020  1.005 - 1.030 Final   pH 05/20/2022 7.0  5.0 - 8.0 Final   Glucose, UA 05/20/2022 NEGATIVE  NEGATIVE mg/dL Final   Hgb urine dipstick 05/20/2022 MODERATE (A)  NEGATIVE Final   Bilirubin Urine 05/20/2022 NEGATIVE  NEGATIVE Final   Ketones, ur 05/20/2022 NEGATIVE  NEGATIVE mg/dL Final   Protein, ur 05/20/2022 NEGATIVE  NEGATIVE mg/dL Final   Nitrite 05/20/2022 NEGATIVE  NEGATIVE Final   Leukocytes,Ua 05/20/2022 NEGATIVE  NEGATIVE Final   RBC / HPF 05/20/2022 21-50  0 - 5 RBC/hpf Final   WBC, UA 05/20/2022 6-10  0 - 5 WBC/hpf Final   Bacteria, UA 05/20/2022 RARE (A)  NONE SEEN Final   Squamous Epithelial / LPF 05/20/2022 11-20  0 - 5 Final   Mucus 05/20/2022 PRESENT   Final   Performed at [facility], [place] [address] [place], [place] [address]   Preg Test, Ur 05/20/2022 NEGATIVE  NEGATIVE Final   Comment:        THE SENSITIVITY OF THIS METHODOLOGY IS >20 mIU/mL. Performed at [facility], [place] [address], [place], [place] [address]    POC Amphetamine UR 05/20/2022 None Detected  NONE DETECTED (Cut Off Level 1000 ng/mL) Final   POC Secobarbital (BAR) 05/20/2022 None Detected  NONE DETECTED (Cut Off Level 300 ng/mL) Final   POC Buprenorphine (BUP) 05/20/2022 None Detected  NONE DETECTED (Cut Off Level 10 ng/mL) Final   POC Oxazepam (BZO) 05/20/2022 None Detected  NONE DETECTED (Cut Off Level 300 ng/mL) Final   POC Cocaine UR 05/20/2022 None Detected  NONE DETECTED (Cut Off Level 300 ng/mL) Final   POC Methamphetamine UR 05/20/2022 None Detected  NONE DETECTED (Cut Off Level 1000 ng/mL) Final   POC Morphine 05/20/2022 None Detected  NONE DETECTED (Cut Off Level 300 ng/mL) Final   POC Methadone UR 05/20/2022 None Detected  NONE DETECTED (Cut Off Level 300 ng/mL) Final   POC Oxycodone UR 05/20/2022 None Detected  NONE DETECTED (Cut Off Level 100 ng/mL) Final   POC Marijuana UR 05/20/2022 Positive (A)  NONE DETECTED (Cut Off Level 50 ng/mL) Final    SARSCOV2ONAVIRUS 2 AG 05/20/2022 NEGATIVE  NEGATIVE Final   Comment: (NOTE) SARS-CoV-2 antigen NOT DETECTED.   Negative results are presumptive.  Negative results do not preclude SARS-CoV-2 infection and should not be used as the sole basis for treatment or other patient management decisions, including infection  control decisions, particularly in the presence of clinical signs and  symptoms consistent with COVID-19, or in those who have been in contact with the virus.  Negative results must be combined with clinical  observations, patient history, and epidemiological information. The expected result is Negative.  Fact Sheet for Patients: HandmadeRecipes.com.cy  Fact Sheet for Healthcare Providers: FuneralLife.at  This test is not yet approved or cleared by the [place] FDA and  has been authorized for detection and/or diagnosis of SARS-CoV-2 by FDA under an Emergency Use Authorization (EUA).  This EUA will remain in effect (meaning this test can be used) for the duration of  the COV                          ID-19 declaration under Section 564(b)(1) of the Act, 21 U.S.C. section 360bbb-3(b)(1), unless the authorization is terminated or revoked sooner.     Preg Test, Ur 05/20/2022 NEGATIVE  NEGATIVE Final   Comment:        THE SENSITIVITY OF THIS METHODOLOGY IS >24 mIU/mL   Admission on 05/14/2022, Discharged on 05/15/2022  Component Date Value Ref Range Status   Sodium 05/14/2022 136  135 - 145 mmol/L Final   Potassium 05/14/2022 3.1 (L)  3.5 - 5.1 mmol/L Final   Chloride 05/14/2022 106  98 - 111 mmol/L Final   CO2 05/14/2022 22  22 - 32 mmol/L Final   Glucose, Bld 05/14/2022 130 (H)  70 - 99 mg/dL Final   Glucose reference range applies only to samples taken after fasting for at least 8 hours.   BUN 05/14/2022 6  6 - 20 mg/dL Final   Creatinine, Ser 05/14/2022 0.90  0.44 - 1.00 mg/dL Final   Calcium 05/14/2022 9.1  8.9 -  10.3 mg/dL Final   Total Protein 05/14/2022 7.1  6.5 - 8.1 g/dL Final   Albumin 05/14/2022 4.2  3.5 - 5.0 g/dL Final   AST 05/14/2022 30  15 - 41 U/L Final   ALT 05/14/2022 35  0 - 44 U/L Final   Alkaline Phosphatase 05/14/2022 46  38 - 126 U/L Final   Total Bilirubin 05/14/2022 0.9  0.3 - 1.2 mg/dL Final   GFR, Estimated 05/14/2022 >60  >60 mL/min Final   Comment: (NOTE) Calculated using the CKD-EPI Creatinine Equation (2021)    Anion gap 05/14/2022 8  5 - 15 Final   Performed at [facility] [address], [place], [place] [address]   WBC 05/14/2022 5.6  4.0 - 10.5 K/uL Final   RBC 05/14/2022 4.79  3.87 - 5.11 MIL/uL Final   Hemoglobin 05/14/2022 12.2  12.0 - 15.0 g/dL Final   HCT 05/14/2022 37.5  36.0 - 46.0 % Final   MCV 05/14/2022 78.3 (L)  80.0 - 100.0 fL Final   MCH 05/14/2022 25.5 (L)  26.0 - 34.0 pg Final   MCHC 05/14/2022 32.5  30.0 - 36.0 g/dL Final   RDW 05/14/2022 14.7  11.5 - 15.5 % Final   Platelets 05/14/2022 187  150 - 400 K/uL Final   nRBC 05/14/2022 0.0  0.0 - 0.2 % Final   Neutrophils Relative % 05/14/2022 53  % Final   Neutro Abs 05/14/2022 3.0  1.7 - 7.7 K/uL Final   Lymphocytes Relative 05/14/2022 38  % Final   Lymphs Abs 05/14/2022 2.1  0.7 - 4.0 K/uL Final   Monocytes Relative 05/14/2022 7  % Final   Monocytes Absolute 05/14/2022 0.4  0.1 - 1.0 K/uL Final   Eosinophils Relative 05/14/2022 1  % Final   Eosinophils Absolute 05/14/2022 0.1  0.0 - 0.5 K/uL Final   Basophils Relative 05/14/2022 1  % Final  Basophils Absolute 05/14/2022 0.0  0.0 - 0.1 K/uL Final   Immature Granulocytes 05/14/2022 0  % Final   Abs Immature Granulocytes 05/14/2022 0.01  0.00 - 0.07 K/uL Final   Performed at [facility], [address], [place], [place] [address]   Preg Test, Ur 05/14/2022 NEGATIVE  NEGATIVE Final   Comment:        THE SENSITIVITY OF THIS METHODOLOGY IS >20 mIU/mL. Performed at [facility], [address], [place], [place] [address]     Color, Urine 05/14/2022 YELLOW  YELLOW Final   APPearance 05/14/2022 HAZY (A)  CLEAR Final   Specific Gravity, Urine 05/14/2022 1.027  1.005 - 1.030 Final   pH 05/14/2022 5.0  5.0 - 8.0 Final   Glucose, UA 05/14/2022 NEGATIVE  NEGATIVE mg/dL Final   Hgb urine dipstick 05/14/2022 SMALL (A)  NEGATIVE Final   Bilirubin Urine 05/14/2022 NEGATIVE  NEGATIVE Final   Ketones, ur 05/14/2022 NEGATIVE  NEGATIVE mg/dL Final   Protein, ur 95/28/4132 NEGATIVE  NEGATIVE mg/dL Final   Nitrite 44/10/270 NEGATIVE  NEGATIVE Final   Leukocytes,Ua 05/14/2022 SMALL (A)  NEGATIVE Final   RBC / HPF 05/14/2022 0-5  0 - 5 RBC/hpf Final   WBC, UA 05/14/2022 11-20  0 - 5 WBC/hpf Final   Bacteria, UA 05/14/2022 RARE (A)  NONE SEEN Final   Squamous Epithelial / LPF 05/14/2022 0-5  0 - 5 Final   Mucus 05/14/2022 PRESENT   Final   Performed at [facility], [address], [place], [place] [address]   Acetaminophen (Tylenol), Serum 05/14/2022 <10 (L)  10 - 30 ug/mL Final   Comment: (NOTE) Therapeutic concentrations vary significantly. A range of 10-30 ug/mL  may be an effective concentration for many patients. However, some  are best treated at concentrations outside of this range. Acetaminophen concentrations >150 ug/mL at 4 hours after ingestion  and >50 ug/mL at 12 hours after ingestion are often associated with  toxic reactions.  Performed at [facility], [address] [place], [place] [address]    Salicylate Lvl 05/14/2022 <7.0 (L)  7.0 - 30.0 mg/dL Final   Performed at [facility], [address] [place], [place] [address]   Alcohol, Ethyl (B) 05/14/2022 <10  <10 mg/dL Final   Comment: (NOTE) Lowest detectable limit for serum alcohol is 10 mg/dL.  For medical purposes only. Performed at [facility], [address] [place], [place] [address]    SARS Coronavirus 2 by RT PCR 05/15/2022 NEGATIVE  NEGATIVE Final   Comment: (NOTE) SARS-CoV-2 target nucleic acids are NOT  DETECTED.  The SARS-CoV-2 RNA is generally detectable in upper and lower respiratory specimens during the acute phase of infection. The lowest concentration of SARS-CoV-2 viral copies this assay can detect is 250 copies / mL. A negative result does not preclude SARS-CoV-2 infection and should not be used as the sole basis for treatment or other patient management decisions.  A negative result may occur with improper specimen collection / handling, submission of specimen other than nasopharyngeal swab, presence of viral mutation(s) within the areas targeted by this assay, and inadequate number of viral copies (<250 copies / mL). A negative result must be combined with clinical observations, patient history, and epidemiological information.  Fact Sheet for Patients:   RoadLapTop.co.za  Fact Sheet for Healthcare Providers: http://kim-miller.com/  This test is not yet approved or  cleared by the [place] and has been authorized for detection and/or diagnosis of SARS-CoV-2 by FDA under an Emergency Use Authorization (EUA).  This EUA will remain in effect (meaning this test can be used) for the duration of the COVID-19 declaration under Section 564(b)(1) of the Act, 21 U.S.C. section 360bbb-3(b)(1), unless the authorization is terminated or revoked sooner.  Performed at [facility], [place] [address] [place], [place] [address]     Allergies: Penicillins  PTA Medications: (Not in a hospital admission)   Long Term Goals: Improvement in symptoms so as ready for discharge  Short Term Goals: Patient will verbalize feelings in meetings with treatment team members., Pt will complete the PHQ9 on admission, day 3 and discharge., and Patient will take medications as prescribed daily.  Medical Decision Making  -Transfer to [facility]   Recommendations  -Transfer to [facility] -Consider IOP/PHP program upon  discharge  Tharon Aquas, NP 05/21/22  8:54 AM

## 2022-05-21 NOTE — ED Notes (Signed)
Pt sleeping@this  time. Breathign even and unlabored. Will continue to monitor or safety

## 2022-05-22 DIAGNOSIS — R45851 Suicidal ideations: Secondary | ICD-10-CM | POA: Diagnosis not present

## 2022-05-22 DIAGNOSIS — Z79899 Other long term (current) drug therapy: Secondary | ICD-10-CM | POA: Diagnosis not present

## 2022-05-22 DIAGNOSIS — F332 Major depressive disorder, recurrent severe without psychotic features: Secondary | ICD-10-CM | POA: Diagnosis not present

## 2022-05-22 DIAGNOSIS — Z20822 Contact with and (suspected) exposure to covid-19: Secondary | ICD-10-CM | POA: Diagnosis not present

## 2022-05-22 MED ORDER — FLUOXETINE HCL 20 MG PO CAPS: 20.0000 mg | ORAL_CAPSULE | Freq: Every day | ORAL | 0 refills | Status: DC
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
## 2022-05-22 NOTE — ED Notes (Signed)
Pt sleeping at present, no distress noted.  Monitoring for safety. 

## 2022-05-22 NOTE — ED Notes (Signed)
Pt participated in AA group.

## 2022-05-22 NOTE — Progress Notes (Signed)
Spirituality group facilitated by Chaplain Tavarious Freel, MDiv, BCC.  Group Description: Group focused on topic of hope. Patients participated in facilitated discussion around topic, connecting with one another around experiences and definitions for hope. Group members engaged with visual explorer photos, reflecting on what hope looks like for them today. Group engaged in discussion around how their definitions of hope are present today in hospital.  Modalities: Psycho-social ed, Adlerian, Narrative, MI  Patient Progress: 

## 2022-05-22 NOTE — ED Notes (Signed)
Pt in bedroom sitting in no acute distress. Denies concerns. Will continue to monitor for safety.

## 2022-05-22 NOTE — ED Notes (Signed)
Pt sleeping in no acute distress. RR even and unlabored. Safety maintained. 

## 2022-05-22 NOTE — ED Notes (Signed)
Pt on phone talking with family. She is calm and cooperative. No c/o pain or distress. Will continue to monitor for safety

## 2022-05-22 NOTE — ED Notes (Signed)
Pt sleeping@this time. Breathing even and unlabored. Will continue to monitor for safety 

## 2022-05-22 NOTE — ED Notes (Signed)
Patient attended group the title was Journey to Recovery we discussed self-direction, Individualized and person-centered, Empowerment, Strength based recovery, Peer support, Respect, Responsibility, and Hope. This came with a work sheet. We also discussed ways to Identify triggers and ways to reduce setbacks, there was a worksheet for that also. We discussed ways to stay active and healthy, so that their concentration would not be so much on what was bothering them, but they could put their focus on other more enjoyable things, like music or going for a walk, spending time with family or good friends, exercising, or talking to a therapist something to put you mind at ease, without causing you to relapse or cause you any distress, coloring or putting a puzzle together, a crossword puzzle, or sewing, there are dozens of things to do if you really want to change your life and get back what you believe you have lost.

## 2022-05-22 NOTE — ED Notes (Signed)
Patient is reporting she did not have the proper support in place prior to coming to the [facility]. Patient reports she is having a difficult time managing her life and the trauma that she has experienced. Patient is denying suicidal and homicidal ideations. Patient is aware of her discharge on tomorrow.

## 2022-05-22 NOTE — ED Provider Notes (Signed)
FBC/OBS ASAP Discharge Summary  Date and Time: 05/22/2022 3:21 PM  Name: Tammy Hayes  MRN:  [id number]   Discharge Diagnoses:  Final diagnoses:  MDD (major depressive disorder), recurrent severe, without psychosis (HCC)  Suicidal ideation   Subjective:   Pt reassessed by nurse practitioner today face to face.   Pt reports feeling "better than yesterday", although endorses does continue to have underlying anxiety, depression. Pt feels that she is tolerating Prozac 20mg  well. She denies any SEs/AEs. She denies suicidal ideations, homicidal ideations, or violent ideations. She denies auditory visual hallucinations or paranoia. Pt reports she feels ready for discharge tomorrow, feels comfortable following up with outpatient services. She is able to verbally contract to safety for herself/others. Discussed plan for PHP/IOP referral, which pt agrees with. She gives the best number and email to reach her at, [phone]; [email or web address]. She gives verbal consent to speak w/ her mother [phone] at [phone].  Collateral w/ Argentina Ponder, [phone]. Absa denies safety concerns w/ discharge tomorrow. Pt lives w/ Absa. Absa denies firearm in the home. Reviewed safety planning w/ Absa: Frequent conversations regarding unsafe thoughts. Locking/monitoring the use of all significant sharps. If there is a firearm in the home, keeping the firearm unloaded, locking the firearm, locking the ammunition separately from the firearm, preventing access to the firearm and the ammunition. Locking/monitoring the use of medications, including over-the-counter medications and supplements. Having a responsible person dispense medications until patient has strengthened coping skills. Room checks for sharps or other harmful objects. Secure all chemical substances that can be ingested or inhaled. Calling 911/EMS or going to the nearest emergency room for any worsening of condition. Discussed w/ Absa that pt has been  started on Prozac 20mg , and referrals have been made to PHP/IOP program. Absa verbalized understanding. Discussed pt will be discharged w/ 7 day sample and 30 day rx for Prozac 20mg . Absa states she will dispense medication to pt.   Update: Pt has intake for PHP on 05/26/22 at 10AM. Pt was notified about this appointment. Called pt's mother [phone], [phone], and she was informed as well.   Stay Summary:  Pt w/ reported hx of depression. Pt was initially seen on 05/20/22 for worsening depression, thoughts of self-harm, and suicidal thoughts, and started on prozac 20mg . Pt was transferred to [facility] on 05/21/22 for further crisis stabilization. On reassessment today, pt reports she feels ready for discharge tomorrow, feels comfortable following up with outpatient services. She is able to verbally contract to safety for herself/others. Collateral was obtained from pt's mother, [phone], who resides w/ pt, and she denies safety concerns w/ pt discharge. Safety planning also completed w/ Absa. Referral has been placed for PHP/IOP program. Pt has intake appointment w/ PHP program on 05/26/22 at 10AM.  Total Time spent with patient: 20 minutes  Past Psychiatric History: Pt w/ reported hx of depression, past SA in 2022 (OD on 15 tylenol pills), NSSIB (cutting).  Past Medical History:  Past Medical History:  Diagnosis Date   Bilateral ovarian cysts    Pediatric overweight 07/13/2013   Pre-diabetes     Past Surgical History:  Procedure Laterality Date   EYE MUSCLE SURGERY     in 3rd grade. No reported residual weakness.    Family History:  Family History  Problem Relation Age of Onset   Depression Mother        grandparents, aunts/uncles.    Heart disease Other        aunts/uncles   Hypertension  Other        aunts/uncles, grandparents   Kidney disease Other        aunts/uncles, grandparents   Stroke Other        aunts/uncles   Cancer Neg Hx    Family Psychiatric History: Unknown Social  History:  Social History   Substance and Sexual Activity  Alcohol Use Not Currently     Social History   Substance and Sexual Activity  Drug Use Not Currently   Types: Marijuana   Comment: Sometimes.    Social History   Socioeconomic History   Marital status: Single    Spouse name: Not on file   Number of children: Not on file   Years of education: Not on file   Highest education level: Not on file  Occupational History   Not on file  Tobacco Use   Smoking status: Some Days    Packs/day: 0.05    Types: Cigarettes   Smokeless tobacco: Never  Substance and Sexual Activity   Alcohol use: Not Currently   Drug use: Not Currently    Types: Marijuana    Comment: Sometimes.   Sexual activity: Yes    Partners: Male  Other Topics Concern   Not on file  Social History Narrative   Lives with father 60% of the time. Student at [employer] finished 7th grade.    Christian per father.    Social Determinants of Health   Financial Resource Strain: Not on file  Food Insecurity: Not on file  Transportation Needs: Not on file  Physical Activity: Not on file  Stress: Not on file  Social Connections: Not on file   SDOH:  SDOH Screenings   Alcohol Screen: Not on file  Depression (PHQ2-9): Medium Risk (05/22/2022)   Depression (PHQ2-9)    PHQ-2 Score: 17  Financial Resource Strain: Not on file  Food Insecurity: Not on file  Housing: Not on file  Physical Activity: Not on file  Social Connections: Not on file  Stress: Not on file  Tobacco Use: High Risk (05/20/2022)   Patient History    Smoking Tobacco Use: Some Days    Smokeless Tobacco Use: Never    Passive Exposure: Not on file  Transportation Needs: Not on file    Tobacco Cessation:  N/A, patient does not currently use tobacco products  Current Medications:  Current Facility-Administered Medications  Medication Dose Route Frequency Provider Last Rate Last Admin   acetaminophen (TYLENOL) tablet 650  mg  650 mg Oral Q6H PRN Rankin, [name] B, NP       alum & mag hydroxide-simeth (MAALOX/MYLANTA) 200-200-20 MG/5ML suspension 30 mL  30 mL Oral Q4H PRN Rankin, [name] B, NP       doxycycline (VIBRA-TABS) tablet 100 mg  100 mg Oral BID Ajibola, Ene A, NP   100 mg at 05/22/22 0919   FLUoxetine (PROZAC) capsule 20 mg  20 mg Oral Daily Rankin, [name] B, NP   20 mg at 05/22/22 0919   hydrOXYzine (ATARAX) tablet 25 mg  25 mg Oral TID PRN Rankin, [name] B, NP   25 mg at 05/21/22 2112   magnesium hydroxide (MILK OF MAGNESIA) suspension 30 mL  30 mL Oral Daily PRN Rankin, [name] B, NP       traZODone (DESYREL) tablet 50 mg  50 mg Oral QHS PRN Rankin, [name] B, NP   50 mg at 05/21/22 2112   Current Outpatient Medications  Medication Sig Dispense Refill   doxycycline (VIBRA-TABS) 100 MG  tablet Take 100 mg by mouth 2 (two) times daily. Take for 7 days starting on 05/14/22.     fluconazole (DIFLUCAN) 150 MG tablet Take 150 mg by mouth once. Take after finishing antibiotic     [START ON 05/23/2022] FLUoxetine (PROZAC) 20 MG capsule Take 1 capsule (20 mg total) by mouth daily. 30 capsule 0    PTA Medications: (Not in a hospital admission)      05/22/2022   10:38 AM 05/20/2022    7:07 PM 05/15/2022    4:38 AM  Depression screen PHQ 2/9  Decreased Interest 2 1 1   Down, Depressed, Hopeless 2 2 3   PHQ - 2 Score 4 3 4   Altered sleeping 2 1 2   Tired, decreased energy 2 1 2   Change in appetite 2 1 1   Feeling bad or failure about yourself  1 2 3   Trouble concentrating 2 1 2   Moving slowly or fidgety/restless 2 1 1   Suicidal thoughts 2 1 1   PHQ-9 Score 17 11 16   Difficult doing work/chores  Somewhat difficult Very difficult    Flowsheet Row ED from 05/20/2022 in [facility] ED from 05/14/2022 in [facility] EMERGENCY DEPARTMENT ED from 03/15/2021 in [facility] EMERGENCY DEPARTMENT  C-SSRS RISK CATEGORY High Risk Moderate Risk No Risk        Musculoskeletal  Strength & Muscle Tone: within normal limits Gait & Station: normal Patient leans: N/A  Psychiatric Specialty Exam  Presentation  General Appearance: Appropriate for Environment; Casual  Eye Contact:Good  Speech:Clear and Coherent; Normal Rate  Speech Volume:Normal  Handedness:Right   Mood and Affect  Mood:Anxious; Depressed  Affect:Flat   Thought Process  Thought Processes:Coherent; Goal Directed; Linear  Descriptions of Associations:Intact  Orientation:Full (Time, Place and Person)  Thought Content:Logical  Diagnosis of Schizophrenia or Schizoaffective disorder in past: No    Hallucinations:Hallucinations: None  Ideas of Reference:None  Suicidal Thoughts:Suicidal Thoughts: No  Homicidal Thoughts:Homicidal Thoughts: No   Sensorium  Memory:Immediate Good  Judgment:Fair  Insight:Fair   Executive Functions  Concentration:Fair  Attention Span:Good  Recall:Fair  Fund of Knowledge:Fair  Language:Good   Psychomotor Activity  Psychomotor Activity:Psychomotor Activity: Normal   Assets  Assets:Communication Skills; Desire for Improvement; Social Support   Sleep  Sleep:Sleep: Fair   No data recorded  Physical Exam  Physical Exam Cardiovascular:     Rate and Rhythm: Normal rate.  Pulmonary:     Effort: Pulmonary effort is normal.  Neurological:     Mental Status: She is alert and oriented to person, place, and time.  Psychiatric:        Attention and Perception: Attention and perception normal.        Mood and Affect: Mood is anxious and depressed. Affect is flat.        Speech: Speech normal.        Behavior: Behavior normal. Behavior is cooperative.        Thought Content: Thought content normal.    Review of Systems  Constitutional:  Negative for chills and fever.  Respiratory:  Negative for shortness of breath.   Cardiovascular:  Negative for chest pain and palpitations.  Gastrointestinal:  Negative for  abdominal pain, constipation, diarrhea, nausea and vomiting.  Neurological:  Negative for headaches.  Psychiatric/Behavioral:  Positive for depression. The patient is nervous/anxious.    Blood pressure (!) 142/74, pulse 83, temperature 98.5 F (36.9 C), temperature source Oral, resp. rate 18, SpO2 100 %. There is no height  or weight on file to calculate BMI.  Demographic Factors:  Adolescent or young adult and Unemployed  Loss Factors: NA  Historical Factors: Prior suicide attempts  Risk Reduction Factors:   Sense of responsibility to family, Living with another person, especially a relative, and Positive social support  Continued Clinical Symptoms:  Previous Psychiatric Diagnoses and Treatments  Cognitive Features That Contribute To Risk:  None    Suicide Risk:  Mild:  Suicidal ideation of limited frequency, intensity, duration, and specificity.  There are no identifiable plans, no associated intent, mild dysphoria and related symptoms, good self-control (both objective and subjective assessment), few other risk factors, and identifiable protective factors, including available and accessible social support.  Plan Of Care/Follow-up recommendations:  Pt has intake for PHP on 05/26/22 at 10AM.   Disposition:  Discharge  Lauree Chandler, NP 05/22/2022, 3:21 PM

## 2022-05-23 ENCOUNTER — Encounter (HOSPITAL_COMMUNITY): Payer: Self-pay

## 2022-05-23 DIAGNOSIS — R45851 Suicidal ideations: Secondary | ICD-10-CM | POA: Diagnosis not present

## 2022-05-23 DIAGNOSIS — Z79899 Other long term (current) drug therapy: Secondary | ICD-10-CM | POA: Diagnosis not present

## 2022-05-23 DIAGNOSIS — Z20822 Contact with and (suspected) exposure to covid-19: Secondary | ICD-10-CM | POA: Diagnosis not present

## 2022-05-23 DIAGNOSIS — F332 Major depressive disorder, recurrent severe without psychotic features: Secondary | ICD-10-CM | POA: Diagnosis not present

## 2022-05-23 NOTE — ED Notes (Signed)
Discharge instructions provided and Pt stated understanding. Pt alert, orient and ambulatory prior to d/c from facility. Personal belongings returned from locker. Pt escorted to the front lobby to d/c home with her mom. Safety maintained.

## 2022-05-23 NOTE — ED Notes (Signed)
Pt was given a muffin and cereal for breakfast. ?

## 2022-05-23 NOTE — BH IP Treatment Plan (Signed)
Interdisciplinary Treatment and Diagnostic Plan Update  05/23/2022 Time of Session: 9:30AM  Tammy Hayes MRN: [id number]  Diagnosis:  Final diagnoses:  MDD (major depressive disorder), recurrent severe, without psychosis (HCC)  Suicidal ideation     Current Medications:  Current Facility-Administered Medications  Medication Dose Route Frequency Provider Last Rate Last Admin   acetaminophen (TYLENOL) tablet 650 mg  650 mg Oral Q6H PRN Rankin, [name] B, NP       alum & mag hydroxide-simeth (MAALOX/MYLANTA) 200-200-20 MG/5ML suspension 30 mL  30 mL Oral Q4H PRN Rankin, [name] B, NP       doxycycline (VIBRA-TABS) tablet 100 mg  100 mg Oral BID Ajibola, Ene A, NP   100 mg at 05/23/22 0944   FLUoxetine (PROZAC) capsule 20 mg  20 mg Oral Daily Rankin, [name] B, NP   20 mg at 05/23/22 0944   hydrOXYzine (ATARAX) tablet 25 mg  25 mg Oral TID PRN Rankin, [name] B, NP   25 mg at 05/22/22 2117   magnesium hydroxide (MILK OF MAGNESIA) suspension 30 mL  30 mL Oral Daily PRN Rankin, [name] B, NP       traZODone (DESYREL) tablet 50 mg  50 mg Oral QHS PRN Rankin, [name] B, NP   50 mg at 05/22/22 2117   Current Outpatient Medications  Medication Sig Dispense Refill   doxycycline (VIBRA-TABS) 100 MG tablet Take 100 mg by mouth 2 (two) times daily. Take for 7 days starting on 05/14/22.     fluconazole (DIFLUCAN) 150 MG tablet Take 150 mg by mouth once. Take after finishing antibiotic     FLUoxetine (PROZAC) 20 MG capsule Take 1 capsule (20 mg total) by mouth daily. 30 capsule 0   PTA Medications: Prior to Admission medications   Medication Sig Start Date End Date Taking? Authorizing Provider  doxycycline (VIBRA-TABS) 100 MG tablet Take 100 mg by mouth 2 (two) times daily. Take for 7 days starting on 05/14/22.   Yes [provider]  fluconazole (DIFLUCAN) 150 MG tablet Take 150 mg by mouth once. Take after finishing antibiotic   Yes [provider]  FLUoxetine (PROZAC) 20 MG capsule Take  1 capsule (20 mg total) by mouth daily. 05/23/22 06/22/22  Lauree Chandler, NP    Patient Stressors: Financial difficulties   Marital or family conflict   Occupational concerns   Traumatic event    Patient Strengths: Motivation for treatment/growth  Supportive family/friends   Treatment Modalities: Medication Management, Group therapy, Case management,  1 to 1 session with clinician, Psychoeducation, Recreational therapy.   Physician Treatment Plan for Primary and Secondary Diagnosis:  Final diagnoses:  MDD (major depressive disorder), recurrent severe, without psychosis (HCC)  Suicidal ideation   Long Term Goal(s): Improvement in symptoms so as ready for discharge  Short Term Goals: Patient will verbalize feelings in meetings with treatment team members. Pt will complete the PHQ9 on admission, day 3 and discharge. Patient will take medications as prescribed daily.  Medication Management: Evaluate patient's response, side effects, and tolerance of medication regimen.  Therapeutic Interventions: 1 to 1 sessions, Unit Group sessions and Medication administration.  Evaluation of Outcomes: Adequate for Discharge  LCSW Treatment Plan for Primary Diagnosis:  Final diagnoses:  MDD (major depressive disorder), recurrent severe, without psychosis (HCC)  Suicidal ideation    Long Term Goal(s): Safe transition to appropriate next level of care at discharge.  Short Term Goals: Facilitate acceptance of mental health diagnosis and concerns through verbal commitment to aftercare plan and  appointments at discharge. and Patient will identify one social support prior to discharge to aid in patient's recovery.  Therapeutic Interventions: Assess for all discharge needs, 1 to 1 time with Child psychotherapist, Explore available resources and support systems, Assess for adequacy in community support network, Educate family and significant other(s) on suicide prevention, Complete Psychosocial  Assessment, Interpersonal group therapy.  Evaluation of Outcomes: Adequate for Discharge   Progress in Treatment: Attending groups: Yes. Participating in groups: Yes. Taking medication as prescribed: Yes. Toleration medication: Yes. Family/Significant other contact made: No, will contact:  no one at this time Patient understands diagnosis: Yes. Discussing patient identified problems/goals with staff: Yes. Medical problems stabilized or resolved: Yes. Denies suicidal/homicidal ideation: Yes. Issues/concerns per patient self-inventory: No. Other: None   New problem(s) identified: No, Describe:  None   New Short Term/Long Term Goal(s): Tammy Hayes shared that her long term goals was to eliminate her depressive symptoms so that she could focus on her life path, future employment and desired goals.   Patient Goals: "I want to be able to be by myself and not be afraid of my thoughts and feelings"  Discharge Plan or Barriers: Tammy Hayes is discharging home with her mother, step-father and sister. Tammy Hayes will begin Partial Hospitalization Program services on Monday with Tammy Hayes. Tammy Hayes has also received additional resources for outpatient therapy and psychiatry.   Reason for Continuation of Hospitalization: None   Estimated Length of Stay: Discharging, Friday, 05/23/22  Last 3 [place] Suicide Severity Risk Score: Flowsheet Row ED from 05/20/2022 in [facility] ED from 05/14/2022 in [facility] EMERGENCY DEPARTMENT ED from 03/15/2021 in [facility] EMERGENCY DEPARTMENT  C-SSRS RISK CATEGORY High Risk Moderate Risk No Risk       Last PHQ 2/9 Scores:    05/22/2022   10:38 AM 05/20/2022    7:07 PM 05/15/2022    4:38 AM  Depression screen PHQ 2/9  Decreased Interest 2 1 1   Down, Depressed, Hopeless 2 2 3   PHQ - 2 Score 4 3 4   Altered sleeping 2 1 2   Tired, decreased energy 2 1 2   Change in appetite 2 1 1   Feeling bad or failure about  yourself  1 2 3   Trouble concentrating 2 1 2   Moving slowly or fidgety/restless 2 1 1   Suicidal thoughts 2 1 1   PHQ-9 Score 17 11 16   Difficult doing work/chores  Somewhat difficult Very difficult    Scribe for Treatment Team: , LCSW 05/23/2022 10:04 AM

## 2022-05-23 NOTE — ED Notes (Signed)
Pt sleeping at present, no distress noted.  Monitoring for safety. 

## 2022-05-23 NOTE — ED Notes (Signed)
Pt was given chicken and veggies for lunch.

## 2022-05-23 NOTE — ED Provider Notes (Signed)
Reminded pt to follow pcp's instructions to complete course of doxycycline and then fluconazole. Pt states she has these medications, does not need rx. Discussed follow up with PCP for medical concerns. Reviewed plan for discharge at [facility] (mom will be picking her up) and for PHP on 05/26/22 at 10AM. Pt gave verbal consent to speak w/ mom, Absa,  [phone].  Spoke w/ Edison Nasuti, [phone]. Confirmed Absa will pick up pt at [facility]. Reviewed pt will start PHP program on 05/26/22 at 10AM. Discussed pt should follow pcp's instructions to complete course of doxycycline and then fluconazole. Discussed follow up with PCP for medical concerns.

## 2022-05-26 ENCOUNTER — Ambulatory Visit (HOSPITAL_COMMUNITY): Payer: No Payment, Other | Admitting: Professional

## 2022-05-26 DIAGNOSIS — F332 Major depressive disorder, recurrent severe without psychotic features: Secondary | ICD-10-CM

## 2022-05-28 ENCOUNTER — Ambulatory Visit (HOSPITAL_COMMUNITY): Payer: No Payment, Other

## 2022-05-29 ENCOUNTER — Ambulatory Visit (HOSPITAL_COMMUNITY): Payer: Self-pay

## 2022-05-29 ENCOUNTER — Telehealth (HOSPITAL_COMMUNITY): Payer: Self-pay | Admitting: Professional

## 2022-05-30 ENCOUNTER — Ambulatory Visit (HOSPITAL_COMMUNITY): Payer: Self-pay

## 2022-06-02 ENCOUNTER — Ambulatory Visit (HOSPITAL_COMMUNITY): Payer: Self-pay

## 2022-06-03 ENCOUNTER — Ambulatory Visit (HOSPITAL_COMMUNITY): Payer: Self-pay

## 2022-06-04 ENCOUNTER — Ambulatory Visit (HOSPITAL_COMMUNITY): Payer: Self-pay

## 2022-06-05 ENCOUNTER — Ambulatory Visit (HOSPITAL_COMMUNITY): Payer: Self-pay

## 2022-06-06 ENCOUNTER — Ambulatory Visit (HOSPITAL_COMMUNITY): Payer: Self-pay

## 2022-06-09 ENCOUNTER — Ambulatory Visit (HOSPITAL_COMMUNITY): Payer: Self-pay

## 2022-06-10 ENCOUNTER — Ambulatory Visit (HOSPITAL_COMMUNITY): Payer: Self-pay

## 2022-06-11 ENCOUNTER — Ambulatory Visit (HOSPITAL_COMMUNITY): Payer: Self-pay

## 2022-06-12 ENCOUNTER — Ambulatory Visit (HOSPITAL_COMMUNITY): Payer: Self-pay

## 2022-06-13 ENCOUNTER — Ambulatory Visit (HOSPITAL_COMMUNITY): Payer: Self-pay

## 2022-06-16 ENCOUNTER — Ambulatory Visit (HOSPITAL_COMMUNITY): Payer: Self-pay

## 2022-06-17 ENCOUNTER — Ambulatory Visit (HOSPITAL_COMMUNITY): Payer: Self-pay

## 2022-06-18 ENCOUNTER — Ambulatory Visit (HOSPITAL_COMMUNITY): Payer: Self-pay

## 2022-06-19 ENCOUNTER — Ambulatory Visit (HOSPITAL_COMMUNITY): Payer: Self-pay

## 2022-06-20 ENCOUNTER — Ambulatory Visit (HOSPITAL_COMMUNITY): Payer: Self-pay

## 2022-06-23 ENCOUNTER — Ambulatory Visit (HOSPITAL_COMMUNITY): Payer: Self-pay

## 2022-06-24 ENCOUNTER — Ambulatory Visit (HOSPITAL_COMMUNITY): Payer: Self-pay

## 2022-08-21 ENCOUNTER — Ambulatory Visit: Payer: Medicaid Other | Admitting: Family Medicine

## 2022-08-21 ENCOUNTER — Telehealth: Payer: Self-pay

## 2022-08-21 ENCOUNTER — Ambulatory Visit: Payer: Medicaid Other

## 2022-08-21 NOTE — Telephone Encounter (Signed)
Per provider review, pt does not need colposcopy today. Should follow up for PAP in 1 year. Called pt; VM left stating patient does not need procedure today. Requested call back to discuss.

## 2022-10-17 ENCOUNTER — Emergency Department (HOSPITAL_COMMUNITY)
Admission: EM | Admit: 2022-10-17 | Discharge: 2022-10-18 | Disposition: A | Discharge status: Home / Self Care | Payer: Self-pay | Attending: Emergency Medicine | Admitting: Emergency Medicine

## 2022-10-17 ENCOUNTER — Encounter (HOSPITAL_COMMUNITY): Payer: Self-pay

## 2022-10-17 ENCOUNTER — Other Ambulatory Visit: Payer: Self-pay

## 2022-10-17 ENCOUNTER — Emergency Department (HOSPITAL_COMMUNITY): Payer: Self-pay

## 2022-10-17 DIAGNOSIS — I10 Essential (primary) hypertension: Secondary | ICD-10-CM | POA: Insufficient documentation

## 2022-10-17 DIAGNOSIS — Z1152 Encounter for screening for COVID-19: Secondary | ICD-10-CM | POA: Insufficient documentation

## 2022-10-17 DIAGNOSIS — F17210 Nicotine dependence, cigarettes, uncomplicated: Secondary | ICD-10-CM | POA: Insufficient documentation

## 2022-10-17 DIAGNOSIS — J101 Influenza due to other identified influenza virus with other respiratory manifestations: Secondary | ICD-10-CM | POA: Insufficient documentation

## 2022-10-17 DIAGNOSIS — J111 Influenza due to unidentified influenza virus with other respiratory manifestations: Secondary | ICD-10-CM

## 2022-10-17 LAB — CBC WITH DIFFERENTIAL/PLATELET
Abs Immature Granulocytes: 0 10*3/uL (ref 0.00–0.07)
Basophils Absolute: 0.1 10*3/uL (ref 0.0–0.1)
Basophils Relative: 1 %
Eosinophils Absolute: 0.2 10*3/uL (ref 0.0–0.5)
Eosinophils Relative: 4 %
HCT: 46.4 % — ABNORMAL HIGH (ref 36.0–46.0)
Hemoglobin: 14.3 g/dL (ref 12.0–15.0)
Lymphocytes Relative: 43 %
Lymphs Abs: 2.4 10*3/uL (ref 0.7–4.0)
MCH: 24.6 pg — ABNORMAL LOW (ref 26.0–34.0)
MCHC: 30.8 g/dL (ref 30.0–36.0)
MCV: 79.9 fL — ABNORMAL LOW (ref 80.0–100.0)
Monocytes Absolute: 0.6 10*3/uL (ref 0.1–1.0)
Monocytes Relative: 10 %
Neutro Abs: 2.3 10*3/uL (ref 1.7–7.7)
Neutrophils Relative %: 42 %
Platelets: 171 10*3/uL (ref 150–400)
RBC: 5.81 MIL/uL — ABNORMAL HIGH (ref 3.87–5.11)
RDW: 15.5 % (ref 11.5–15.5)
WBC: 5.5 10*3/uL (ref 4.0–10.5)
nRBC: 0 % (ref 0.0–0.2)
nRBC: 0 /100 WBC

## 2022-10-17 LAB — RESP PANEL BY RT-PCR (RSV, FLU A&B, COVID)  RVPGX2
Influenza A by PCR: POSITIVE — AB
Influenza B by PCR: NEGATIVE
Resp Syncytial Virus by PCR: NEGATIVE
SARS Coronavirus 2 by RT PCR: NEGATIVE

## 2022-10-17 LAB — BASIC METABOLIC PANEL
Anion gap: 9 (ref 5–15)
BUN: 9 mg/dL (ref 6–20)
CO2: 23 mmol/L (ref 22–32)
Calcium: 8.9 mg/dL (ref 8.9–10.3)
Chloride: 107 mmol/L (ref 98–111)
Creatinine, Ser: 0.97 mg/dL (ref 0.44–1.00)
GFR, Estimated: >60 mL/min (ref >60)
Glucose, Bld: 101 mg/dL — ABNORMAL HIGH (ref 70–99)
Potassium: 3.7 mmol/L (ref 3.5–5.1)
Sodium: 139 mmol/L (ref 135–145)

## 2022-10-17 MED ORDER — ONDANSETRON HCL 4 MG PO TABS
4.0000 mg | ORAL_TABLET | Freq: Three times a day (TID) | ORAL | 0 refills | Status: DC | PRN
  Filled 2022-10-17: qty 12, 4d supply, fill #0

## 2022-10-17 MED ORDER — IBUPROFEN 400 MG PO TABS
600.0000 mg | ORAL_TABLET | Freq: Once | ORAL | Status: AC
  Administered 2022-10-17: 600 mg via ORAL
  Filled 2022-10-17: qty 1

## 2022-10-17 MED ORDER — ACETAMINOPHEN 500 MG PO TABS
1000.0000 mg | ORAL_TABLET | Freq: Once | ORAL | Status: AC
  Administered 2022-10-17: 1000 mg via ORAL
  Filled 2022-10-17: qty 2

## 2022-10-17 MED ORDER — OSELTAMIVIR PHOSPHATE 75 MG PO CAPS
75.0000 mg | ORAL_CAPSULE | Freq: Two times a day (BID) | ORAL | 0 refills | Status: DC
  Filled 2022-10-17: qty 10, 5d supply, fill #0

## 2022-10-17 NOTE — ED Provider Triage Note (Signed)
Emergency Medicine Provider Triage Evaluation Note  Tammy Hayes , a 26 y.o. female  was evaluated in triage.  Pt complains of congestion, shortness of breath, cough, vomiting, and body aches for the past 2 days.  Pt ambulating in triage without difficulty.  Denies fevers, but has had chills and sweats.  Denies diarrhea, chest pain, syncope, dizziness, lightheadedness.    Review of Systems  Positive: As above Negative: As above  Physical Exam  BP (!) 155/96 (BP Location: Right Arm)   Pulse (!) 114   Temp 98.8 F (37.1 C) (Oral)   Resp 18   Ht 5\' 6"  (1.676 m)   Wt 124.7 kg   LMP 10/10/2022   SpO2 100%   BMI 44.39 kg/m  Gen:   Awake, no distress   Resp:  Normal effort  MSK:   Moves extremities without difficulty  Other:    Medical Decision Making  Medically screening exam initiated at 8:22 PM.  Appropriate orders placed.  MIKEL PYON was informed that the remainder of the evaluation will be completed by another provider, this initial triage assessment does not replace that evaluation, and the importance of remaining in the ED until their evaluation is complete.     [place], Lenard Simmer 10/17/22 2032

## 2022-10-17 NOTE — ED Provider Notes (Signed)
[facility] EMERGENCY DEPARTMENT Provider Note  CSN: [id number] Arrival date & time: 10/17/22 1732  Chief Complaint(s) Emesis  HPI Tammy Hayes is a 26 y.o. female presenting to the emergency department with flulike symptoms.  She reports nausea and vomiting with associated congestion, cough, myalgias, subjective fevers and chills.  No chest pain, diarrhea, syncope, dizziness or lightheadedness.  She reports a mild sore throat.  She has taken NyQuil without relief of her symptoms.  Her sister has similar symptoms.  Symptoms are moderate.   Past Medical History Past Medical History:  Diagnosis Date   Bilateral ovarian cysts    Pediatric overweight 07/13/2013   Pre-diabetes    Patient Active Problem List   Diagnosis Date Noted   Depression 05/21/2022   MDD (major depressive disorder), recurrent severe, without psychosis (HCC) 05/20/2022   Suicidal ideation 05/20/2022   Adjustment disorder with depressed mood 05/15/2022   Urinary tract infection without hematuria 05/15/2022   Symptomatic anemia 08/14/2020   Pancytopenia (HCC) 08/14/2020   Thrombocytopenia (HCC) 08/14/2020   HSIL (high grade squamous intraepithelial lesion) on Pap smear of cervix 05/09/2020   Depressive episode 05/09/2020   Hypertension 05/03/2019   Iron deficiency anemia 04/19/2019   Menorrhagia 04/19/2019   Prediabetes 07/06/2014   Acanthosis nigricans 05/09/2014   Learning disability 07/13/2013   Home Medication(s) Prior to Admission medications   Medication Sig Start Date End Date Taking? Authorizing Provider  ondansetron (ZOFRAN) 4 MG tablet Take 1 tablet (4 mg total) by mouth every 8 (eight) hours as needed for nausea or vomiting. 10/17/22  Yes Lonell Grandchild, MD  oseltamivir (TAMIFLU) 75 MG capsule Take 1 capsule (75 mg total) by mouth every 12 (twelve) hours. 10/17/22  Yes Lonell Grandchild, MD  doxycycline (VIBRA-TABS) 100 MG tablet Take 100 mg by mouth 2 (two) times daily. Take  for 7 days starting on 05/14/22.    [provider]  fluconazole (DIFLUCAN) 150 MG tablet Take 150 mg by mouth once. Take after finishing antibiotic    [provider]  FLUoxetine (PROZAC) 20 MG capsule Take 1 capsule (20 mg total) by mouth daily. 05/23/22 06/22/22  Lauree Chandler, NP                                                                                                                                    Past Surgical History Past Surgical History:  Procedure Laterality Date   EYE MUSCLE SURGERY     in 3rd grade. No reported residual weakness.    Family History Family History  Problem Relation Age of Onset   Depression Mother        grandparents, aunts/uncles.    Heart disease Other        aunts/uncles   Hypertension Other        aunts/uncles, grandparents   Kidney disease Other        aunts/uncles, grandparents  Stroke Other        aunts/uncles   Cancer Neg Hx     Social History Social History   Tobacco Use   Smoking status: Some Days    Packs/day: 0.05    Types: Cigarettes   Smokeless tobacco: Never  Vaping Use   Vaping Use: Never used  Substance Use Topics   Alcohol use: Not Currently   Drug use: Not Currently    Types: Marijuana    Comment: Sometimes.   Allergies Penicillins  Review of Systems Review of Systems  All other systems reviewed and are negative.   Physical Exam Vital Signs  I have reviewed the triage vital signs BP (!) 155/96 (BP Location: Right Arm)   Pulse (!) 114   Temp 98.8 F (37.1 C) (Oral)   Resp 18   Ht 5\' 6"  (1.676 m)   Wt 124.7 kg   LMP 10/10/2022   SpO2 100%   BMI 44.39 kg/m  Physical Exam Vitals and nursing note reviewed.  Constitutional:      General: She is not in acute distress.    Appearance: She is well-developed.  HENT:     Head: Normocephalic and atraumatic.     Mouth/Throat:     Mouth: Mucous membranes are moist.  Eyes:     Pupils: Pupils are equal, round, and reactive to light.   Cardiovascular:     Rate and Rhythm: Normal rate and regular rhythm.     Heart sounds: No murmur heard. Pulmonary:     Effort: Pulmonary effort is normal. No respiratory distress.     Breath sounds: Normal breath sounds.  Abdominal:     General: Abdomen is flat.     Palpations: Abdomen is soft.     Tenderness: There is no abdominal tenderness.  Musculoskeletal:        General: No tenderness.     Right lower leg: No edema.     Left lower leg: No edema.  Skin:    General: Skin is warm and dry.  Neurological:     General: No focal deficit present.     Mental Status: She is alert. Mental status is at baseline.  Psychiatric:        Mood and Affect: Mood normal.        Behavior: Behavior normal.     ED Results and Treatments Labs (all labs ordered are listed, but only abnormal results are displayed) Labs Reviewed  RESP PANEL BY RT-PCR (RSV, FLU A&B, COVID)  RVPGX2 - Abnormal; Notable for the following components:      Result Value   Influenza A by PCR POSITIVE (*)    All other components within normal limits  BASIC METABOLIC PANEL - Abnormal; Notable for the following components:   Glucose, Bld 101 (*)    All other components within normal limits  CBC WITH DIFFERENTIAL/PLATELET - Abnormal; Notable for the following components:   RBC 5.81 (*)    HCT 46.4 (*)    MCV 79.9 (*)    MCH 24.6 (*)    All other components within normal limits  Radiology DG Chest 2 View  Result Date: 10/17/2022 CLINICAL DATA:  Cough, fever, headache, short of breath EXAM: CHEST - 2 VIEW COMPARISON:  06/14/2016 FINDINGS: The heart size and mediastinal contours are within normal limits. Both lungs are clear. The visualized skeletal structures are unremarkable. IMPRESSION: No active cardiopulmonary disease. Electronically Signed   By: Randa Ngo M.D.   On: 10/17/2022 21:19     Pertinent labs & imaging results that were available during my care of the patient were reviewed by me and considered in my medical decision making (see MDM for details).  Medications Ordered in ED Medications - No data to display                                                                                                                                   Procedures Procedures  (including critical care time)  Medical Decision Making / ED Course   MDM:  26 year old female presenting with flulike symptoms.  Her flu test is positive.  She has had around 2 days of symptoms so we will start on Tamiflu.  Discussed supportive care with Tylenol, Motrin, fluid hydration.  Labs were obtained in triage were overall reassuring.  Chest x-ray without evidence of pneumonia. Will discharge patient to home. All questions answered. Patient comfortable with plan of discharge. Return precautions discussed with patient and specified on the after visit summary.        Lab Tests: -I ordered, reviewed, and interpreted labs.   The pertinent results include:   Labs Reviewed  RESP PANEL BY RT-PCR (RSV, FLU A&B, COVID)  RVPGX2 - Abnormal; Notable for the following components:      Result Value   Influenza A by PCR POSITIVE (*)    All other components within normal limits  BASIC METABOLIC PANEL - Abnormal; Notable for the following components:   Glucose, Bld 101 (*)    All other components within normal limits  CBC WITH DIFFERENTIAL/PLATELET - Abnormal; Notable for the following components:   RBC 5.81 (*)    HCT 46.4 (*)    MCV 79.9 (*)    MCH 24.6 (*)    All other components within normal limits    Notable for positive flu   Imaging Studies ordered: I ordered imaging studies including CXR On my interpretation imaging demonstrates no pneumonia I independently visualized and interpreted imaging. I agree with the radiologist interpretation   Medicines ordered and prescription drug  management: Meds ordered this encounter  Medications   oseltamivir (TAMIFLU) 75 MG capsule    Sig: Take 1 capsule (75 mg total) by mouth every 12 (twelve) hours.    Dispense:  10 capsule    Refill:  0   ondansetron (ZOFRAN) 4 MG tablet    Sig: Take 1 tablet (4 mg total) by mouth every 8 (eight) hours as needed for nausea or vomiting.    Dispense:  12 tablet    Refill:  0    -I have reviewed the patients home medicines and have made adjustments as needed   Social Determinants of Health:  Diagnosis or treatment significantly limited by social determinants of health: current smoker and obesity   Reevaluation: After the interventions noted above, I reevaluated the patient and found that they have improved  Co morbidities that complicate the patient evaluation  Past Medical History:  Diagnosis Date   Bilateral ovarian cysts    Pediatric overweight 07/13/2013   Pre-diabetes       Dispostion: Disposition decision including need for hospitalization was considered, and patient discharged from emergency department.    Final Clinical Impression(s) / ED Diagnoses Final diagnoses:  Influenza     This chart was dictated using voice recognition software.  Despite best efforts to proofread,  errors can occur which can change the documentation meaning.    Cristie Hem, MD 10/17/22 [phone]

## 2022-10-17 NOTE — ED Triage Notes (Signed)
Pt states she has had congestion, shortness of breath, cough, vomiting and body aches x 2 days.

## 2022-10-17 NOTE — Discharge Instructions (Signed)
Please take Tylenol and Motrin for your symptoms at home.  You can take 650 mg of Tylenol every 6 hours and 600 mg of ibuprofen every 6 hours as needed for your symptoms.  You can take these medicines together as needed, either at the same time, or alternating every 3 hours.  

## 2022-10-18 NOTE — ED Notes (Signed)
Patient verbalizes understanding of discharge instructions. Opportunity for questioning and answers were provided. Armband removed by staff, pt discharged from ED. Pt ambulatory to ED waiting room with steady gait.  

## 2022-10-21 ENCOUNTER — Other Ambulatory Visit (HOSPITAL_COMMUNITY): Payer: Self-pay

## 2022-11-01 ENCOUNTER — Other Ambulatory Visit (HOSPITAL_COMMUNITY): Payer: Self-pay

## 2022-11-04 ENCOUNTER — Encounter (HOSPITAL_COMMUNITY): Payer: Self-pay | Admitting: Emergency Medicine

## 2022-11-04 ENCOUNTER — Other Ambulatory Visit: Payer: Self-pay

## 2022-11-04 ENCOUNTER — Inpatient Hospital Stay (HOSPITAL_COMMUNITY)
Admission: AD | Admit: 2022-11-04 | Discharge: 2022-11-05 | Disposition: A | Discharge status: Home / Self Care | Payer: Medicaid Other | Attending: Emergency Medicine | Admitting: Emergency Medicine

## 2022-11-04 DIAGNOSIS — N939 Abnormal uterine and vaginal bleeding, unspecified: Secondary | ICD-10-CM | POA: Insufficient documentation

## 2022-11-04 DIAGNOSIS — F17210 Nicotine dependence, cigarettes, uncomplicated: Secondary | ICD-10-CM | POA: Insufficient documentation

## 2022-11-04 DIAGNOSIS — N760 Acute vaginitis: Secondary | ICD-10-CM | POA: Insufficient documentation

## 2022-11-04 DIAGNOSIS — B9689 Other specified bacterial agents as the cause of diseases classified elsewhere: Secondary | ICD-10-CM

## 2022-11-04 DIAGNOSIS — N898 Other specified noninflammatory disorders of vagina: Secondary | ICD-10-CM

## 2022-11-04 LAB — URINALYSIS, ROUTINE W REFLEX MICROSCOPIC
Bacteria, UA: NONE SEEN
Bilirubin Urine: NEGATIVE
Glucose, UA: NEGATIVE mg/dL
Ketones, ur: NEGATIVE mg/dL
Nitrite: NEGATIVE
Protein, ur: NEGATIVE mg/dL
Specific Gravity, Urine: 1.011 (ref 1.005–1.030)
pH: 5 (ref 5.0–8.0)

## 2022-11-04 LAB — WET PREP, GENITAL
Sperm: NONE SEEN
Trich, Wet Prep: NONE SEEN
WBC, Wet Prep HPF POC: >=10 — AB (ref <10)
Yeast Wet Prep HPF POC: NONE SEEN

## 2022-11-04 LAB — PREGNANCY, URINE: Preg Test, Ur: NEGATIVE

## 2022-11-04 NOTE — MAU Provider Note (Signed)
Chief Complaint:  Vaginal Discharge   None    HPI: Tammy Hayes is a 27 y.o. who presents to maternity admissions reporting vaginal discharge with odor.  Also has some pink spotting, has a Nexplanon in place. . She reports no vaginal itching/burning, urinary symptoms, h/a, dizziness, n/v, or fever/chills.    Vaginal Discharge The patient's primary symptoms include a genital odor, vaginal bleeding and vaginal discharge. The patient's pertinent negatives include no genital itching, genital lesions or pelvic pain. This is a new problem. The current episode started in the past 7 days. The problem occurs constantly. The problem has been unchanged. She is not pregnant. Pertinent negatives include no abdominal pain, back pain, chills, constipation, diarrhea, fever, nausea or vomiting. The vaginal bleeding is spotting. She has not been passing clots. She has not been passing tissue. Nothing aggravates the symptoms. She has tried nothing for the symptoms. She is sexually active. Contraceptive use: nexplanon.    RN Note Tammy Hayes is a 27 y.o.  here in MAU reporting vag discharge with odor. Also some pink spotting. Pt came over from main ED. Tammy Hayes CNM in Triage to see pt and discuss plan of care. Pt agrees. LMP: 10/25/22 Onset of complaint: 2wks Pain score: 0  Past Medical History: Past Medical History:  Diagnosis Date   Bilateral ovarian cysts    Pediatric overweight 07/13/2013   Pre-diabetes     Past obstetric history: OB History  No obstetric history on file.    Past Surgical History: Past Surgical History:  Procedure Laterality Date   EYE MUSCLE SURGERY     in 3rd grade. No reported residual weakness.     Family History: Family History  Problem Relation Age of Onset   Depression Mother        grandparents, aunts/uncles.    Heart disease Other        aunts/uncles   Hypertension Other        aunts/uncles, grandparents   Kidney disease Other        aunts/uncles,  grandparents   Stroke Other        aunts/uncles   Cancer Neg Hx     Social History: Social History   Tobacco Use   Smoking status: Some Days    Packs/day: 0.05    Types: Cigarettes   Smokeless tobacco: Never  Vaping Use   Vaping Use: Never used  Substance Use Topics   Alcohol use: Not Currently   Drug use: Not Currently    Types: Marijuana    Comment: Sometimes.    Allergies:  Allergies  Allergen Reactions   Penicillins Anaphylaxis, Itching and Other (See Comments)    Did it involve swelling of the face/tongue/throat, SOB, or low BP? Yes Did it involve sudden or severe rash/hives, skin peeling, or any reaction on the inside of your mouth or nose? Y Did you need to seek medical attention at a hospital or doctor's office? Y When did it last happen?     Childhood. If all above answers are "NO", may proceed with cephalosporin use.     Meds:  Medications Prior to Admission  Medication Sig Dispense Refill Last Dose   FLUoxetine (PROZAC) 20 MG capsule Take 1 capsule (20 mg total) by mouth daily. (Patient not taking: Reported on 11/04/2022) 30 capsule 0 Not Taking   ondansetron (ZOFRAN) 4 MG tablet Take 1 tablet (4 mg total) by mouth every 8 (eight) hours as needed for nausea or vomiting. (Patient not taking: Reported  on 11/04/2022) 12 tablet 0 Not Taking   oseltamivir (TAMIFLU) 75 MG capsule Take 1 capsule (75 mg total) by mouth every 12 (twelve) hours. (Patient not taking: Reported on 11/04/2022) 10 capsule 0 Not Taking    I have reviewed patient's Past Medical Hx, Surgical Hx, Family Hx, Social Hx, medications and allergies.  ROS:  Review of Systems  Constitutional:  Negative for chills and fever.  Gastrointestinal:  Negative for abdominal pain, constipation, diarrhea, nausea and vomiting.  Genitourinary:  Positive for vaginal discharge. Negative for pelvic pain.  Musculoskeletal:  Negative for back pain.   Other systems negative     Physical Exam  Patient Vitals for the  past 24 hrs:  BP Temp Temp src Pulse Resp SpO2 Height Weight  11/04/22 2316 -- 98.4 F (36.9 C) -- (!) 107 18 100 % 5\' 6"  (1.676 m) 115.7 kg  11/04/22 2144 -- -- -- -- -- -- 5\' 6"  (1.676 m) 124.7 kg  11/04/22 2143 (!) 161/77 99.2 F (37.3 C) Oral 99 18 100 % -- --   Constitutional: Well-developed, well-nourished female in no acute distress.  Cardiovascular: normal rate and rhythm, no ectopy audible, S1 & S2 heard, no murmur Respiratory: normal effort, no distress. Lungs CTAB with no wheezes or crackles GI: Abd soft, non-tender.  Nondistended.  No rebound, No guarding.  Bowel Sounds audible  MS: Extremities nontender, no edema, normal ROM Neurologic: Alert and oriented x 4.   Grossly nonfocal. GU: Neg CVAT. Skin:  Warm and Dry Psych:  Affect appropriate.  Labs: Results for orders placed or performed during the hospital encounter of 11/04/22 (from the past 24 hour(s))  HIV Antibody (routine testing w rflx)     Status: None   Collection Time: 11/04/22 11:22 PM  Result Value Ref Range   HIV Screen 4th Generation wRfx Non Reactive Non Reactive  Hepatitis B surface antigen     Status: None   Collection Time: 11/04/22 11:22 PM  Result Value Ref Range   Hepatitis B Surface Ag NON REACTIVE NON REACTIVE  Wet prep, genital     Status: Abnormal   Collection Time: 11/04/22 11:23 PM   Specimen: PATH Cytology Cervicovaginal Ancillary Only  Result Value Ref Range   Yeast Wet Prep HPF POC NONE SEEN NONE SEEN   Trich, Wet Prep NONE SEEN NONE SEEN   Clue Cells Wet Prep HPF POC PRESENT (A) NONE SEEN   WBC, Wet Prep HPF POC >=10 (A) <10   Sperm NONE SEEN   Urinalysis, Routine w reflex microscopic Urine, Clean Catch     Status: Abnormal   Collection Time: 11/04/22 11:25 PM  Result Value Ref Range   Color, Urine YELLOW YELLOW   APPearance CLEAR CLEAR   Specific Gravity, Urine 1.011 1.005 - 1.030   pH 5.0 5.0 - 8.0   Glucose, UA NEGATIVE NEGATIVE mg/dL   Hgb urine dipstick MODERATE (A) NEGATIVE    Bilirubin Urine NEGATIVE NEGATIVE   Ketones, ur NEGATIVE NEGATIVE mg/dL   Protein, ur NEGATIVE NEGATIVE mg/dL   Nitrite NEGATIVE NEGATIVE   Leukocytes,Ua TRACE (A) NEGATIVE   RBC / HPF 0-5 0 - 5 RBC/hpf   WBC, UA 0-5 0 - 5 WBC/hpf   Bacteria, UA NONE SEEN NONE SEEN   Squamous Epithelial / HPF 0-5 0 - 5 /HPF   Mucus PRESENT   Pregnancy, urine     Status: None   Collection Time: 11/04/22 11:25 PM  Result Value Ref Range   Preg Test, Ur NEGATIVE  NEGATIVE    Imaging:    MAU Course/MDM: I have reviewed the triage vital signs and the nursing notes.   Pertinent labs & imaging results that were available during my care of the patient were reviewed by me and considered in my medical decision making (see chart for details).      I have reviewed her medical records including past results, notes and treatments.   I have ordered labs as follows: see above Imaging ordered: none Results reviewed.  Treatments in MAU included none.   Pt stable at time of discharge.  Assessment: Vaginal Discharge Vaginal Odor Bacterial vaginosis  Plan: Discharge home Recommend take meds as directed Rx sent for Flagyl  for BV Safe sex practices  Encouraged to return here or to other Urgent Care/ED if she develops worsening of symptoms, increase in pain, fever, or other concerning symptoms.   Hansel Feinstein CNM, MSN Certified Nurse-Midwife 11/04/2022 11:24 PM

## 2022-11-04 NOTE — MAU Note (Signed)
.  Tammy Hayes is a 27 y.o.  here in MAU reporting vag discharge with odor. Also some pink spotting. Pt came over from main ED. Hansel Feinstein CNM in Triage to see pt and discuss plan of care. Pt agrees. LMP: 10/25/22 Onset of complaint: 2wks Pain score: 0 Vitals:   11/04/22 2143  BP: (!) 161/77  Pulse: 99  Resp: 18  Temp: 99.2 F (37.3 C)  SpO2: 100%     FHT:n/a Lab orders placed from triage:

## 2022-11-04 NOTE — ED Triage Notes (Signed)
Pt c/o vaginal discharge and foul smell. States wanting to be tested for STDs. Denies abdominal pain.

## 2022-11-04 NOTE — ED Notes (Signed)
Report given to Denice Paradise RN at [facility]. Pt to be transported to MAU

## 2022-11-04 NOTE — ED Provider Triage Note (Signed)
Emergency Medicine Provider OB Triage Evaluation Note  Tammy Hayes is a 27 y.o. female, No obstetric history on file., at Unknown gestation who presents to the emergency department with complaints of vaginal DC and some spotting. Sx started several weeks ago. No fever, no urinary sx.   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   1 female partner - inconsistent condom use  Review of  Systems  Positive:  Negative:   Physical Exam  BP (!) 161/77 (BP Location: Right Arm)   Pulse 99   Temp 99.2 F (37.3 C) (Oral)   Resp 18   Ht 5\' 6"  (1.676 m)   Wt 124.7 kg   LMP 10/10/2022   SpO2 100%   BMI 44.39 kg/m  General: Awake, no distress  HEENT: Atraumatic  Resp: Normal effort  Cardiac: Normal rate Abd: Nondistended, nontender  MSK: Moves all extremities without difficulty Neuro: Speech clear  Medical Decision Making  Pt evaluated for pregnancy concern and is stable for transfer to MAU. Pt is in agreement with plan for transfer.  10:33 PM Discussed with MAU APP, Chrys Racer, who accepts patient in transfer.  Clinical Impression  No diagnosis found.     Tammy Hayes, [place] 11/04/22 2249

## 2022-11-05 ENCOUNTER — Encounter: Payer: Self-pay | Admitting: Advanced Practice Midwife

## 2022-11-05 DIAGNOSIS — A549 Gonococcal infection, unspecified: Secondary | ICD-10-CM | POA: Insufficient documentation

## 2022-11-05 DIAGNOSIS — N898 Other specified noninflammatory disorders of vagina: Secondary | ICD-10-CM

## 2022-11-05 HISTORY — DX: Gonococcal infection, unspecified: A54.9

## 2022-11-05 LAB — GC/CHLAMYDIA PROBE AMP (~~LOC~~) NOT AT ARMC
Chlamydia: NEGATIVE
Comment: NEGATIVE
Comment: NORMAL
Neisseria Gonorrhea: POSITIVE — AB

## 2022-11-05 LAB — HIV ANTIBODY (ROUTINE TESTING W REFLEX): HIV Screen 4th Generation wRfx: NONREACTIVE

## 2022-11-05 LAB — HEPATITIS B SURFACE ANTIGEN: Hepatitis B Surface Ag: NONREACTIVE

## 2022-11-05 LAB — RPR: RPR Ser Ql: NONREACTIVE

## 2022-11-05 MED ORDER — METRONIDAZOLE 500 MG PO TABS: 500.0000 mg | ORAL_TABLET | Freq: Two times a day (BID) | ORAL | 0 refills | Status: AC

## 2022-11-05 NOTE — Discharge Instructions (Signed)
[facility] Providers   [facility]             [address], [place], [place] [address] [phone]  [facility]                                                             [address], [address], [place], [place], [address] [phone]  [facility]                                    [address], [address], [place], [place], [address] [phone]  [facility] [place] [address], [address], [place], [place], [address] [phone]  [facility]                                 [address], [place], [place], [address] [phone]  [facility]                                    [address], [place], [place], [address] [phone]  [facility] [address], [address], [place], [place], [address]                              [facility] [address], [address], [place], [place], [address] [phone]  [facility]         Phone: [phone]  [facility]      Phone: [phone]   [facility]      Phone: [phone]  [name]         Phone: [phone]   [name]    Phone: [phone]   [facility]      Phone: [phone]  [facility]     Phone: [phone]  [facility]        Phone: [phone]  [facility] ([facility]) [phone]  [facility]      Phone: [phone]   

## 2022-11-05 NOTE — Progress Notes (Signed)
Hansel Feinstein CNM in to see pt and discuss lab results. Pt then d/c home from [facility] by CNM
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
## 2022-11-24 ENCOUNTER — Encounter: Payer: Medicaid Other | Admitting: Student

## 2023-02-27 ENCOUNTER — Emergency Department (HOSPITAL_COMMUNITY): Payer: Medicaid Other

## 2023-02-27 ENCOUNTER — Emergency Department (HOSPITAL_COMMUNITY)
Admission: EM | Admit: 2023-02-27 | Discharge: 2023-02-27 | Disposition: A | Discharge status: Home / Self Care | Payer: Medicaid Other | Attending: Emergency Medicine | Admitting: Emergency Medicine

## 2023-02-27 ENCOUNTER — Encounter (HOSPITAL_COMMUNITY): Payer: Self-pay | Admitting: Emergency Medicine

## 2023-02-27 DIAGNOSIS — R1013 Epigastric pain: Secondary | ICD-10-CM | POA: Diagnosis not present

## 2023-02-27 DIAGNOSIS — R1110 Vomiting, unspecified: Secondary | ICD-10-CM | POA: Diagnosis not present

## 2023-02-27 DIAGNOSIS — R109 Unspecified abdominal pain: Secondary | ICD-10-CM | POA: Diagnosis present

## 2023-02-27 DIAGNOSIS — R630 Anorexia: Secondary | ICD-10-CM | POA: Diagnosis not present

## 2023-02-27 DIAGNOSIS — K760 Fatty (change of) liver, not elsewhere classified: Secondary | ICD-10-CM | POA: Diagnosis not present

## 2023-02-27 LAB — URINALYSIS, ROUTINE W REFLEX MICROSCOPIC
Bacteria, UA: NONE SEEN
Bilirubin Urine: NEGATIVE
Glucose, UA: NEGATIVE mg/dL
Ketones, ur: NEGATIVE mg/dL
Nitrite: NEGATIVE
Protein, ur: NEGATIVE mg/dL
Specific Gravity, Urine: 1.001 — ABNORMAL LOW (ref 1.005–1.030)
pH: 6 (ref 5.0–8.0)

## 2023-02-27 LAB — CBC WITH DIFFERENTIAL/PLATELET
Abs Immature Granulocytes: 0.01 10*3/uL (ref 0.00–0.07)
Basophils Absolute: 0.1 10*3/uL (ref 0.0–0.1)
Basophils Relative: 1 %
Eosinophils Absolute: 0.1 10*3/uL (ref 0.0–0.5)
Eosinophils Relative: 1 %
HCT: 42.1 % (ref 36.0–46.0)
Hemoglobin: 13.7 g/dL (ref 12.0–15.0)
Immature Granulocytes: 0 %
Lymphocytes Relative: 53 %
Lymphs Abs: 4.1 10*3/uL — ABNORMAL HIGH (ref 0.7–4.0)
MCH: 25.9 pg — ABNORMAL LOW (ref 26.0–34.0)
MCHC: 32.5 g/dL (ref 30.0–36.0)
MCV: 79.6 fL — ABNORMAL LOW (ref 80.0–100.0)
Monocytes Absolute: 0.4 10*3/uL (ref 0.1–1.0)
Monocytes Relative: 5 %
Neutro Abs: 3.1 10*3/uL (ref 1.7–7.7)
Neutrophils Relative %: 40 %
Platelets: 207 10*3/uL (ref 150–400)
RBC: 5.29 MIL/uL — ABNORMAL HIGH (ref 3.87–5.11)
RDW: 16 % — ABNORMAL HIGH (ref 11.5–15.5)
WBC: 7.8 10*3/uL (ref 4.0–10.5)
nRBC: 0 % (ref 0.0–0.2)

## 2023-02-27 LAB — LIPASE, BLOOD: Lipase: 46 U/L (ref 11–51)

## 2023-02-27 LAB — COMPREHENSIVE METABOLIC PANEL
ALT: 31 U/L (ref 0–44)
AST: 29 U/L (ref 15–41)
Albumin: 4.4 g/dL (ref 3.5–5.0)
Alkaline Phosphatase: 53 U/L (ref 38–126)
Anion gap: 10 (ref 5–15)
BUN: 9 mg/dL (ref 6–20)
CO2: 19 mmol/L — ABNORMAL LOW (ref 22–32)
Calcium: 9.2 mg/dL (ref 8.9–10.3)
Chloride: 109 mmol/L (ref 98–111)
Creatinine, Ser: 0.86 mg/dL (ref 0.44–1.00)
GFR, Estimated: >60 mL/min (ref >60)
Glucose, Bld: 129 mg/dL — ABNORMAL HIGH (ref 70–99)
Potassium: 3.4 mmol/L — ABNORMAL LOW (ref 3.5–5.1)
Sodium: 138 mmol/L (ref 135–145)
Total Bilirubin: 0.5 mg/dL (ref 0.3–1.2)
Total Protein: 7.8 g/dL (ref 6.5–8.1)

## 2023-02-27 LAB — PREGNANCY, URINE: Preg Test, Ur: NEGATIVE

## 2023-02-27 MED ORDER — OMEPRAZOLE 20 MG PO CPDR: 20.0000 mg | DELAYED_RELEASE_CAPSULE | Freq: Every day | ORAL | 0 refills | Status: DC

## 2023-02-27 MED ORDER — ALUM & MAG HYDROXIDE-SIMETH 200-200-20 MG/5ML PO SUSP
30.0000 mL | Freq: Once | ORAL | Status: AC
  Administered 2023-02-27: 30 mL via ORAL
  Filled 2023-02-27: qty 30

## 2023-02-27 MED ORDER — PANTOPRAZOLE SODIUM 40 MG PO TBEC: 40.0000 mg | DELAYED_RELEASE_TABLET | Freq: Every day | ORAL | Status: DC

## 2023-02-27 MED ORDER — ONDANSETRON HCL 4 MG/2ML IJ SOLN
4.0000 mg | Freq: Once | INTRAMUSCULAR | Status: AC
Start: 2023-02-27 — End: 2023-02-27
  Administered 2023-02-27: 4 mg via INTRAVENOUS
  Filled 2023-02-27: qty 2

## 2023-02-27 NOTE — Discharge Instructions (Signed)
You were seen today for abdominal pain.  Your workup is largely reassuring including ultrasound imaging of your gallbladder.  Follow-up with your primary doctor.  Start omeprazole daily to see if this helps with your pain.

## 2023-02-27 NOTE — ED Triage Notes (Signed)
Belly pain for 3 weeks, nausea, vomiting, diarrhea. Pt endorses drinking ETOH.

## 2023-02-27 NOTE — ED Provider Notes (Signed)
[place] EMERGENCY DEPARTMENT AT [facility] Provider Note   CSN: [id number] Arrival date & time: 02/27/23  0053     History  Chief Complaint  Patient presents with   Abdominal Pain    Tammy Hayes is a 27 y.o. female.  HPI     This is a 27 year old female who presents with abdominal pain.  Patient reports 2 to 3-week history of intermittent abdominal pain.  She reports crampy pain and epigastric pain.  Reports decreased appetite and increase in pain with eating.  She also has had some vomiting with eating.  She states that tonight she drank alcohol and that seem to make it worse.  Denies urinary symptoms or vaginal discharge.  No concern for STDs.  Home Medications Prior to Admission medications   Medication Sig Start Date End Date Taking? Authorizing Provider  omeprazole (PRILOSEC) 20 MG capsule Take 1 capsule (20 mg total) by mouth daily. 02/27/23  Yes Addi Pak, Mayer Masker, MD  FLUoxetine (PROZAC) 20 MG capsule Take 1 capsule (20 mg total) by mouth daily. Patient not taking: Reported on 11/04/2022 05/23/22 06/22/22  Lauree Chandler, NP  ondansetron (ZOFRAN) 4 MG tablet Take 1 tablet (4 mg total) by mouth every 8 (eight) hours as needed for nausea or vomiting. Patient not taking: Reported on 11/04/2022 10/17/22   Lonell Grandchild, MD  oseltamivir (TAMIFLU) 75 MG capsule Take 1 capsule (75 mg total) by mouth every 12 (twelve) hours. Patient not taking: Reported on 11/04/2022 10/17/22   Lonell Grandchild, MD      Allergies    Penicillins    Review of Systems   Review of Systems  Constitutional:  Negative for fever.  Gastrointestinal:  Positive for abdominal pain, nausea and vomiting.  All other systems reviewed and are negative.   Physical Exam Updated Vital Signs BP (!) 152/95   Pulse 93   Temp 98.3 F (36.8 C) (Oral)   Resp 18   SpO2 100%  Physical Exam Vitals and nursing note reviewed.  Constitutional:      Appearance: She is well-developed. She is  obese.  HENT:     Head: Normocephalic and atraumatic.  Eyes:     Pupils: Pupils are equal, round, and reactive to light.  Cardiovascular:     Rate and Rhythm: Normal rate and regular rhythm.     Heart sounds: Normal heart sounds.  Pulmonary:     Effort: Pulmonary effort is normal. No respiratory distress.     Breath sounds: No wheezing.  Abdominal:     General: Bowel sounds are normal.     Palpations: Abdomen is soft.     Tenderness: There is abdominal tenderness in the epigastric area. There is no guarding or rebound.  Musculoskeletal:     Cervical back: Neck supple.  Skin:    General: Skin is warm and dry.  Neurological:     Mental Status: She is alert and oriented to person, place, and time.     ED Results / Procedures / Treatments   Labs (all labs ordered are listed, but only abnormal results are displayed) Labs Reviewed  COMPREHENSIVE METABOLIC PANEL - Abnormal; Notable for the following components:      Result Value   Potassium 3.4 (*)    CO2 19 (*)    Glucose, Bld 129 (*)    All other components within normal limits  CBC WITH DIFFERENTIAL/PLATELET - Abnormal; Notable for the following components:   RBC 5.29 (*)  MCV 79.6 (*)    MCH 25.9 (*)    RDW 16.0 (*)    Lymphs Abs 4.1 (*)    All other components within normal limits  URINALYSIS, ROUTINE W REFLEX MICROSCOPIC - Abnormal; Notable for the following components:   Color, Urine STRAW (*)    Specific Gravity, Urine 1.001 (*)    Hgb urine dipstick SMALL (*)    Leukocytes,Ua MODERATE (*)    All other components within normal limits  LIPASE, BLOOD  PREGNANCY, URINE    EKG None  Radiology US Abdomen Limited RUQ (LIVER/GB)  Result Date: 02/27/2023 CLINICAL DATA:  27 year old female with history of epigastric pain. EXAM: ULTRASOUND ABDOMEN LIMITED RIGHT UPPER QUADRANT COMPARISON:  No priors. FINDINGS: Gallbladder: Gallbladder is nearly completely contracted (per report from the sonographer, the patient was not  NPO before the examination). No definite gallstones identified. Gallbladder wall thickness is normal at 1.8 mm. No pericholecystic fluid. Per report from the sonographer, there was no sonographic Murphy's sign on examination. Common bile duct: Diameter: 3.5 mm Liver: No focal lesion identified. Diffuse increased hepatic echogenicity, indicative of a background of hepatic steatosis. Portal vein is patent on color Doppler imaging with normal direction of blood flow towards the liver. Other: None. IMPRESSION: 1. No acute findings to account for the patient's symptoms. Specifically, no evidence of cholelithiasis or findings to suggest acute cholecystitis. 2. Hepatic steatosis. Electronically Signed   By: Trudie Reed M.D.   On: 02/27/2023 06:00    Procedures Procedures    Medications Ordered in ED Medications  pantoprazole (PROTONIX) EC tablet 40 mg (has no administration in time range)  ondansetron (ZOFRAN) injection 4 mg (4 mg Intravenous Given 02/27/23 0350)  alum & mag hydroxide-simeth (MAALOX/MYLANTA) 200-200-20 MG/5ML suspension 30 mL (30 mLs Oral Given 02/27/23 0351)    ED Course/ Medical Decision Making/ A&P                             Medical Decision Making Amount and/or Complexity of Data Reviewed Labs: ordered. Radiology: ordered.  Risk OTC drugs. Prescription drug management.   This patient presents to the ED for concern of abdominal pain, this involves an extensive number of treatment options, and is a complaint that carries with it a high risk of complications and morbidity.  I considered the following differential and admission for this acute, potentially life threatening condition.  The differential diagnosis includes gastritis, gastroenteritis, cholecystitis, less likely appendicitis or urinary tract infection given location  MDM:    This is a 27 year old female who presents with abdominal pain.  It is acute on chronic.  She is nontoxic and vital signs are reassuring.  It  is mostly epigastric in nature.  There is some association with food and she also endorses some vomiting which makes me suspicious for gastritis versus pancreatitis.  Labs obtained and reviewed and largely unremarkable including CBC, CMP, lipase.  Urinalysis is not consistent with UTI.  Urine pregnancy is negative.  Right upper quadrant ultrasound does not show any evidence of cholecystitis.  Patient was given a GI cocktail and Protonix.  Will discharge with omeprazole.  (Labs, imaging, consults)  Labs: I Ordered, and personally interpreted labs.  The pertinent results include: CBC, CMP, lipase, urinalysis, urine pregnancy  Imaging Studies ordered: I ordered imaging studies including right upper quadrant ultrasound I independently visualized and interpreted imaging. I agree with the radiologist interpretation  Additional history obtained from chart review.  External records  from outside source obtained and reviewed including prior evaluations  Cardiac Monitoring: The patient was maintained on a cardiac monitor.  If on the cardiac monitor, I personally viewed and interpreted the cardiac monitored which showed an underlying rhythm of: Sinus  Reevaluation: After the interventions noted above, I reevaluated the patient and found that they have :improved  Social Determinants of Health:  lives independently  Disposition: Discharge  Co morbidities that complicate the patient evaluation  Past Medical History:  Diagnosis Date   Bilateral ovarian cysts    Pediatric overweight 07/13/2013   Pre-diabetes      Medicines Meds ordered this encounter  Medications   ondansetron (ZOFRAN) injection 4 mg   alum & mag hydroxide-simeth (MAALOX/MYLANTA) 200-200-20 MG/5ML suspension 30 mL   pantoprazole (PROTONIX) EC tablet 40 mg   omeprazole (PRILOSEC) 20 MG capsule    Sig: Take 1 capsule (20 mg total) by mouth daily.    Dispense:  30 capsule    Refill:  0    I have reviewed the patients home  medicines and have made adjustments as needed  Problem List / ED Course: Problem List Items Addressed This Visit   None Visit Diagnoses     Epigastric pain    -  Primary                   Final Clinical Impression(s) / ED Diagnoses Final diagnoses:  Epigastric pain    Rx / DC Orders ED Discharge Orders          Ordered    omeprazole (PRILOSEC) 20 MG capsule  Daily        02/27/23 0607              Shon Baton, MD 02/27/23 [phone]

## 2023-05-26 ENCOUNTER — Encounter (HOSPITAL_COMMUNITY): Payer: Self-pay | Admitting: *Deleted

## 2023-05-26 ENCOUNTER — Ambulatory Visit (HOSPITAL_COMMUNITY)
Admission: EM | Admit: 2023-05-26 | Discharge: 2023-05-26 | Disposition: A | Discharge status: Home / Self Care | Payer: MEDICAID | Attending: Emergency Medicine | Admitting: Emergency Medicine

## 2023-05-26 DIAGNOSIS — Z113 Encounter for screening for infections with a predominantly sexual mode of transmission: Secondary | ICD-10-CM | POA: Diagnosis present

## 2023-05-26 DIAGNOSIS — N760 Acute vaginitis: Secondary | ICD-10-CM | POA: Insufficient documentation

## 2023-05-26 DIAGNOSIS — A5400 Gonococcal infection of lower genitourinary tract, unspecified: Secondary | ICD-10-CM | POA: Diagnosis not present

## 2023-05-26 LAB — HIV ANTIBODY (ROUTINE TESTING W REFLEX): HIV Screen 4th Generation wRfx: NONREACTIVE

## 2023-05-26 NOTE — ED Provider Notes (Signed)
[facility]    CSN: [id number] Arrival date & time: 05/26/23  1059      History   Chief Complaint Chief Complaint  Patient presents with   SEXUALLY TRANSMITTED DISEASE    HPI Tammy Hayes is a 27 y.o. female.   Patient presents requesting routine STI testing.  Denies all symptoms.  No known exposure.  Past Medical History:  Diagnosis Date   Bilateral ovarian cysts    Pediatric overweight 07/13/2013   Pre-diabetes     Patient Active Problem List   Diagnosis Date Noted   Gonorrhea 11/05/2022   Depression 05/21/2022   MDD (major depressive disorder), recurrent severe, without psychosis (HCC) 05/20/2022   Suicidal ideation 05/20/2022   Adjustment disorder with depressed mood 05/15/2022   Urinary tract infection without hematuria 05/15/2022   Symptomatic anemia 08/14/2020   Pancytopenia (HCC) 08/14/2020   Thrombocytopenia (HCC) 08/14/2020   HSIL (high grade squamous intraepithelial lesion) on Pap smear of cervix 05/09/2020   Depressive episode 05/09/2020   Hypertension 05/03/2019   Iron deficiency anemia 04/19/2019   Menorrhagia 04/19/2019   Prediabetes 07/06/2014   Acanthosis nigricans 05/09/2014   Learning disability 07/13/2013    Past Surgical History:  Procedure Laterality Date   EYE MUSCLE SURGERY     in 3rd grade. No reported residual weakness.     OB History   No obstetric history on file.      Home Medications    Prior to Admission medications   Medication Sig Start Date End Date Taking? Authorizing Provider  etonogestrel (NEXPLANON) 68 MG IMPL implant 1 each by Subdermal route once.    [provider]  FLUoxetine (PROZAC) 20 MG capsule Take 1 capsule (20 mg total) by mouth daily. Patient not taking: Reported on 11/04/2022 05/23/22 06/22/22  Lauree Chandler, NP  omeprazole (PRILOSEC) 20 MG capsule Take 1 capsule (20 mg total) by mouth daily. 02/27/23   Horton, Mayer Masker, MD  ondansetron (ZOFRAN) 4 MG tablet Take 1 tablet (4 mg  total) by mouth every 8 (eight) hours as needed for nausea or vomiting. Patient not taking: Reported on 11/04/2022 10/17/22   Lonell Grandchild, MD  oseltamivir (TAMIFLU) 75 MG capsule Take 1 capsule (75 mg total) by mouth every 12 (twelve) hours. Patient not taking: Reported on 11/04/2022 10/17/22   Lonell Grandchild, MD    Family History Family History  Problem Relation Age of Onset   Depression Mother        grandparents, aunts/uncles.    Heart disease Other        aunts/uncles   Hypertension Other        aunts/uncles, grandparents   Kidney disease Other        aunts/uncles, grandparents   Stroke Other        aunts/uncles   Cancer Neg Hx     Social History Social History   Tobacco Use   Smoking status: Some Days    Current packs/day: 0.05    Types: Cigarettes   Smokeless tobacco: Never  Vaping Use   Vaping status: Never Used  Substance Use Topics   Alcohol use: Not Currently   Drug use: Yes    Types: Marijuana    Comment: Sometimes.     Allergies   Penicillins   Review of Systems Review of Systems  Constitutional: Negative.   HENT: Negative.    Respiratory: Negative.    Cardiovascular: Negative.   Gastrointestinal: Negative.   Genitourinary: Negative.   Musculoskeletal: Negative.  Skin: Negative.      Physical Exam Triage Vital Signs ED Triage Vitals  Encounter Vitals Group     BP 05/26/23 1133 (!) 149/87     Systolic BP Percentile --      Diastolic BP Percentile --      Pulse Rate 05/26/23 1133 94     Resp 05/26/23 1133 18     Temp 05/26/23 1133 98.2 F (36.8 C)     Temp Source 05/26/23 1133 Oral     SpO2 05/26/23 1133 97 %     Weight --      Height --      Head Circumference --      Peak Flow --      Pain Score 05/26/23 1132 0     Pain Loc --      Pain Education --      Exclude from Growth Chart --    No data found.  Updated Vital Signs BP (!) 149/87 (BP Location: Left Arm)   Pulse 94   Temp 98.2 F (36.8 C) (Oral)   Resp 18    SpO2 97%   Visual Acuity Right Eye Distance:   Left Eye Distance:   Bilateral Distance:    Right Eye Near:   Left Eye Near:    Bilateral Near:     Physical Exam Constitutional:      Appearance: Normal appearance.  Eyes:     Extraocular Movements: Extraocular movements intact.  Pulmonary:     Effort: Pulmonary effort is normal.  Genitourinary:    Comments: deferred Neurological:     Mental Status: She is alert and oriented to person, place, and time. Mental status is at baseline.      UC Treatments / Results  Labs (all labs ordered are listed, but only abnormal results are displayed) Labs Reviewed  HIV ANTIBODY (ROUTINE TESTING W REFLEX)  RPR  CERVICOVAGINAL ANCILLARY ONLY    EKG   Radiology No results found.  Procedures Procedures (including critical care time)  Medications Ordered in UC Medications - No data to display  Initial Impression / Assessment and Plan / UC Course  I have reviewed the triage vital signs and the nursing notes.  Pertinent labs & imaging results that were available during my care of the patient were reviewed by me and considered in my medical decision making (see chart for details).  Routine screening for STI   STI labs pending will treat per protocol, advised abstinence until lab results, and/or treatment is complete, advised condom use during all sexual encounters moving, may follow-up with urgent care as needed  Final Clinical Impressions(s) / UC Diagnoses   Final diagnoses:  Routine screening for STI (sexually transmitted infection)     Discharge Instructions      Labs pending 2-3 days, you will be contacted if positive for any sti and treatment will be sent to the pharmacy, you will have to return to the clinic if positive for gonorrhea to receive treatment   Please refrain from having sex until labs results, if positive please refrain from having sex until treatment complete and symptoms resolve   If positive for  HIV, Syphilis, Chlamydia  gonorrhea or trichomoniasis please notify partner or partners so they may tested as well  Moving forward, it is recommended you use some form of protection against the transmission of sti infections  such as condoms or dental dams with each sexual encounter     ED Prescriptions   None  PDMP not reviewed this encounter.   Valinda Hoar, NP 05/26/23 1205

## 2023-05-26 NOTE — ED Triage Notes (Signed)
Pt states she would like STI check. She denies any SX.

## 2023-05-26 NOTE — Discharge Instructions (Addendum)
Labs pending 2-3 days, you will be contacted if positive for any sti and treatment will be sent to the pharmacy, you will have to return to the clinic if positive for gonorrhea to receive treatment   Please refrain from having sex until labs results, if positive please refrain from having sex until treatment complete and symptoms resolve   If positive for HIV, Syphilis, Chlamydia  gonorrhea or trichomoniasis please notify partner or partners so they may tested as well  Moving forward, it is recommended you use some form of protection against the transmission of sti infections  such as condoms or dental dams with each sexual encounter   

## 2023-05-28 ENCOUNTER — Emergency Department (HOSPITAL_COMMUNITY)
Admission: EM | Admit: 2023-05-28 | Discharge: 2023-05-28 | Disposition: A | Discharge status: Home / Self Care | Payer: MEDICAID | Attending: Emergency Medicine | Admitting: Emergency Medicine

## 2023-05-28 ENCOUNTER — Other Ambulatory Visit: Payer: Self-pay

## 2023-05-28 ENCOUNTER — Encounter (HOSPITAL_COMMUNITY): Payer: Self-pay | Admitting: Emergency Medicine

## 2023-05-28 DIAGNOSIS — Z202 Contact with and (suspected) exposure to infections with a predominantly sexual mode of transmission: Secondary | ICD-10-CM | POA: Insufficient documentation

## 2023-05-28 DIAGNOSIS — Z21 Asymptomatic human immunodeficiency virus [HIV] infection status: Secondary | ICD-10-CM | POA: Diagnosis not present

## 2023-05-28 DIAGNOSIS — N760 Acute vaginitis: Secondary | ICD-10-CM | POA: Insufficient documentation

## 2023-05-28 DIAGNOSIS — A5402 Gonococcal vulvovaginitis, unspecified: Secondary | ICD-10-CM | POA: Diagnosis not present

## 2023-05-28 DIAGNOSIS — A549 Gonococcal infection, unspecified: Secondary | ICD-10-CM

## 2023-05-28 DIAGNOSIS — W57XXXA Bitten or stung by nonvenomous insect and other nonvenomous arthropods, initial encounter: Secondary | ICD-10-CM | POA: Diagnosis not present

## 2023-05-28 DIAGNOSIS — T63441A Toxic effect of venom of bees, accidental (unintentional), initial encounter: Secondary | ICD-10-CM

## 2023-05-28 DIAGNOSIS — B9689 Other specified bacterial agents as the cause of diseases classified elsewhere: Secondary | ICD-10-CM

## 2023-05-28 LAB — PREGNANCY, URINE: Preg Test, Ur: NEGATIVE

## 2023-05-28 MED ORDER — METRONIDAZOLE 500 MG PO TABS: 500.0000 mg | ORAL_TABLET | Freq: Two times a day (BID) | ORAL | 0 refills | Status: AC

## 2023-05-28 MED ORDER — GENTAMICIN SULFATE 40 MG/ML IJ SOLN
240.0000 mg | Freq: Once | INTRAMUSCULAR | Status: AC
  Administered 2023-05-28: 240 mg via INTRAMUSCULAR
  Filled 2023-05-28: qty 6

## 2023-05-28 MED ORDER — ACETAMINOPHEN 500 MG PO TABS
1000.0000 mg | ORAL_TABLET | Freq: Once | ORAL | Status: AC
  Administered 2023-05-28: 1000 mg via ORAL
  Filled 2023-05-28: qty 2

## 2023-05-28 NOTE — ED Notes (Signed)
Pt ambulated to bathroom for urine sample.

## 2023-05-28 NOTE — Discharge Instructions (Addendum)
Gonorrhea-I have given you a dose of ceftriaxone this will cover you for gonorrhea, you also tested positive for bacterial vaginosis, I have started on Flagyl please take as prescribed.  I recommend abstinence for the next 7 days as you can give it to your partners.  Please have your sexual partners evaluated and treated for STDs as they can give it back to you.  Please follow-up with OB/GYN for further assessment, in the future you can follow-up with the health department for STD as a treat and evaluate for free. Bee sting-recommend symptom management, may use over-the-counter pain medications, apply warm compresses to it, if you develop itchiness you can take over-the-counter antihistamines like Benadryl or Claritin as needed.  Follow-up with community health and wellness as needed.   Come back to the emergency department if you develop chest pain, shortness of breath, severe abdominal pain, uncontrolled nausea, vomiting, diarrhea.

## 2023-05-28 NOTE — ED Triage Notes (Signed)
Patient BIB EMS for evaluation of wasp sting to nose x 8 hours ago.  Family reports patient has had increased anxiety since sting.  C/o panic attacks and "passing out"  Only swelling present is to nose at site of sting.  Reports she was recently checked for STD and was positive for gonorrhea and BV.  Has not received treatment.  No current symptoms.

## 2023-05-28 NOTE — ED Provider Notes (Signed)
[place] EMERGENCY DEPARTMENT AT [facility] Provider Note   CSN: [id number] Arrival date & time: 05/28/23  0045     History  Chief Complaint  Patient presents with   Insect Bite   Exposure to STD    Tammy Hayes is a 27 y.o. female.  HPI   Patient with medical history including ovarian cyst, prediabetes, presenting with being stung by a wasp as well as testing positive for gonorrhea.  Patient states that she went to urgent care 2 days ago, states that she noticed that she is having some vaginal discharge which was smelly, she had no pelvic pain, no vaginal bleeding, says that she is currently has a Nexplanon, no suprapubic pain no back pain or flank pain denies any urinary symptoms.  No history of ectopic pregnancies, history of ovarian cyst but has no pelvic pain at this time.  She also notes that she was stung by wasp around 7 PM yesterday, states the window was open and the wasp stung her nose, no history of anaphylactic shock, she denies any tongue or lip swelling difficulty breathing, she states that it she had a lot of pain and caused her to have a panic attack.  But she currently feels fine this time she has no other complaints.  She took nothing prior to arrival.  Reviewed her chart was seen at urgent care on the 30th, tested positive for BV as well as gonorrhea, HIV and syphilis were both negative.  Home Medications Prior to Admission medications   Medication Sig Start Date End Date Taking? Authorizing Provider  metroNIDAZOLE (FLAGYL) 500 MG tablet Take 1 tablet (500 mg total) by mouth 2 (two) times daily for 7 days. 05/28/23 06/04/23 Yes Carroll Sage, PA-C  etonogestrel (NEXPLANON) 68 MG IMPL implant 1 each by Subdermal route once.    [provider]  FLUoxetine (PROZAC) 20 MG capsule Take 1 capsule (20 mg total) by mouth daily. Patient not taking: Reported on 11/04/2022 05/23/22 06/22/22  Lauree Chandler, NP  omeprazole (PRILOSEC) 20 MG capsule Take 1  capsule (20 mg total) by mouth daily. 02/27/23   Horton, Mayer Masker, MD  ondansetron (ZOFRAN) 4 MG tablet Take 1 tablet (4 mg total) by mouth every 8 (eight) hours as needed for nausea or vomiting. Patient not taking: Reported on 11/04/2022 10/17/22   Lonell Grandchild, MD  oseltamivir (TAMIFLU) 75 MG capsule Take 1 capsule (75 mg total) by mouth every 12 (twelve) hours. Patient not taking: Reported on 11/04/2022 10/17/22   Lonell Grandchild, MD      Allergies    Penicillins    Review of Systems   Review of Systems  Constitutional:  Negative for chills and fever.  Respiratory:  Negative for shortness of breath.   Cardiovascular:  Negative for chest pain.  Gastrointestinal:  Negative for abdominal pain.  Genitourinary:  Positive for vaginal discharge.  Neurological:  Negative for headaches.    Physical Exam Updated Vital Signs BP (!) 158/82 (BP Location: Right Arm)   Pulse 92   Temp 98.2 F (36.8 C)   Resp 18   Ht 5\' 6"  (1.676 m)   Wt 115.7 kg   SpO2 100%   BMI 41.16 kg/m  Physical Exam Vitals and nursing note reviewed.  Constitutional:      General: She is not in acute distress.    Appearance: She is not ill-appearing.  HENT:     Head: Normocephalic and atraumatic.     Nose:  No congestion.     Comments: Small papule noted on the end of patient's nose, slight erythema no drainage or discharge present, there is no periorbital edema or erythema no proptosis, no facial or oral edema,     Mouth/Throat:     Comments: no trismus no torticollis no muffled tone voice. Eyes:     Conjunctiva/sclera: Conjunctivae normal.  Cardiovascular:     Rate and Rhythm: Normal rate and regular rhythm.     Pulses: Normal pulses.     Heart sounds: No murmur heard.    No friction rub. No gallop.  Pulmonary:     Effort: No respiratory distress.     Breath sounds: No wheezing, rhonchi or rales.  Abdominal:     Palpations: Abdomen is soft.     Tenderness: There is no abdominal tenderness.  There is no right CVA tenderness or left CVA tenderness.     Comments: Abdomen nondistended, soft, nontender my examination.  Skin:    General: Skin is warm and dry.  Neurological:     Mental Status: She is alert.  Psychiatric:        Mood and Affect: Mood normal.     ED Results / Procedures / Treatments   Labs (all labs ordered are listed, but only abnormal results are displayed) Labs Reviewed  PREGNANCY, URINE    EKG None  Radiology No results found.  Procedures Procedures    Medications Ordered in ED Medications  acetaminophen (TYLENOL) tablet 1,000 mg (1,000 mg Oral Given 05/28/23 0349)  gentamicin (GARAMYCIN) injection 240 mg (240 mg Intramuscular Given 05/28/23 0420)    ED Course/ Medical Decision Making/ A&P                                 Medical Decision Making Amount and/or Complexity of Data Reviewed Labs: ordered.  Risk OTC drugs. Prescription drug management.   This patient presents to the ED for concern of bee sting, STD, this involves an extensive number of treatment options, and is a complaint that carries with it a high risk of complications and morbidity.  The differential diagnosis includes anaphylaxis, angioedema, PID, UTI    Additional history obtained:  Additional history obtained from N/A External records from outside source obtained and reviewed including urgent care notes   Co morbidities that complicate the patient evaluation  Prediabetes  Social Determinants of Health:  N/A    Lab Tests:  I Ordered, and personally interpreted labs.  The pertinent results include: Urine pregnancy is negative   Imaging Studies ordered:  I ordered imaging studies including N/A I independently visualized and interpreted imaging which showed N/A I agree with the radiologist interpretation   Cardiac Monitoring:  The patient was maintained on a cardiac monitor.  I personally viewed and interpreted the cardiac monitored which showed an  underlying rhythm of: N/A   Medicines ordered and prescription drug management:  I ordered medication including ceftriaxone, Tylenol I have reviewed the patients home medicines and have made adjustments as needed  Critical Interventions:  N/A   Reevaluation:  Presents with STD, as well as being stung by a bee, she has a benign physical exam, will provide her pain medication for bee sting, will also obtain urine pregnancy.  Patient is resting comfortably, she is in agreement discharge at this time  Consultations Obtained:  N/a    Test Considered:  N/a    Rule out Suspicion for ectopic pregnancy  or ovarian torsion is low at this time as she has a negative urine pregnancy, presentation atypical no suprapubic or adnexal pain.  I doubt PID as again she has no pelvic pain, she is nontoxic-appearing, presentation atypical.  I doubt UTI Pilo or kidney stone endorsing any urinary symptoms no flank tenderness or CVA tenderness.  Doubt anaphylaxis or angioedema vital signs are reassuring, no GI symptoms, no systemic rash, there is no oral edema.    Dispostion and problem list  After consideration of the diagnostic results and the patients response to treatment, I feel that the patent would benefit from discharge.  Bee sting-will recommend symptom management follow-up with PCP as needed STI-patient was given ceftriaxone, should cover for gonorrhea, will discharge home on Flagyl for BV.  Follow-up with OB/GYN for further assessment.            Final Clinical Impression(s) / ED Diagnoses Final diagnoses:  Bee sting, accidental or unintentional, initial encounter  BV (bacterial vaginosis)  Gonorrhea    Rx / DC Orders ED Discharge Orders          Ordered    metroNIDAZOLE (FLAGYL) 500 MG tablet  2 times daily        05/28/23 0442              Carroll Sage, PA-C 05/28/23 6962    Tilden Fossa, MD 05/28/23 [phone]

## 2024-01-31 ENCOUNTER — Encounter (HOSPITAL_BASED_OUTPATIENT_CLINIC_OR_DEPARTMENT_OTHER): Payer: Self-pay

## 2024-01-31 ENCOUNTER — Observation Stay (HOSPITAL_BASED_OUTPATIENT_CLINIC_OR_DEPARTMENT_OTHER)
Admission: EM | Admit: 2024-01-31 | Discharge: 2024-02-02 | Disposition: A | Discharge status: Home / Self Care | Payer: MEDICAID | Attending: Emergency Medicine | Admitting: Emergency Medicine

## 2024-01-31 ENCOUNTER — Other Ambulatory Visit: Payer: Self-pay

## 2024-01-31 DIAGNOSIS — F413 Other mixed anxiety disorders: Secondary | ICD-10-CM

## 2024-01-31 DIAGNOSIS — I10 Essential (primary) hypertension: Secondary | ICD-10-CM | POA: Diagnosis not present

## 2024-01-31 DIAGNOSIS — E66812 Obesity, class 2: Secondary | ICD-10-CM | POA: Diagnosis present

## 2024-01-31 DIAGNOSIS — R45851 Suicidal ideations: Principal | ICD-10-CM

## 2024-01-31 DIAGNOSIS — F17210 Nicotine dependence, cigarettes, uncomplicated: Secondary | ICD-10-CM | POA: Diagnosis not present

## 2024-01-31 DIAGNOSIS — E1165 Type 2 diabetes mellitus with hyperglycemia: Secondary | ICD-10-CM | POA: Diagnosis present

## 2024-01-31 DIAGNOSIS — E1110 Type 2 diabetes mellitus with ketoacidosis without coma: Secondary | ICD-10-CM | POA: Diagnosis not present

## 2024-01-31 DIAGNOSIS — F329 Major depressive disorder, single episode, unspecified: Secondary | ICD-10-CM | POA: Diagnosis not present

## 2024-01-31 DIAGNOSIS — F10129 Alcohol abuse with intoxication, unspecified: Secondary | ICD-10-CM | POA: Insufficient documentation

## 2024-01-31 DIAGNOSIS — Z6839 Body mass index (BMI) 39.0-39.9, adult: Secondary | ICD-10-CM | POA: Diagnosis not present

## 2024-01-31 DIAGNOSIS — Z794 Long term (current) use of insulin: Secondary | ICD-10-CM | POA: Diagnosis not present

## 2024-01-31 DIAGNOSIS — F1290 Cannabis use, unspecified, uncomplicated: Secondary | ICD-10-CM | POA: Diagnosis not present

## 2024-01-31 DIAGNOSIS — Z79891 Long term (current) use of opiate analgesic: Secondary | ICD-10-CM | POA: Diagnosis not present

## 2024-01-31 DIAGNOSIS — Z23 Encounter for immunization: Secondary | ICD-10-CM | POA: Diagnosis not present

## 2024-01-31 DIAGNOSIS — E0865 Diabetes mellitus due to underlying condition with hyperglycemia: Secondary | ICD-10-CM

## 2024-01-31 DIAGNOSIS — L03012 Cellulitis of left finger: Secondary | ICD-10-CM | POA: Diagnosis not present

## 2024-01-31 DIAGNOSIS — T1491XA Suicide attempt, initial encounter: Secondary | ICD-10-CM | POA: Diagnosis present

## 2024-01-31 DIAGNOSIS — F32A Depression, unspecified: Secondary | ICD-10-CM | POA: Diagnosis present

## 2024-01-31 HISTORY — DX: Depression, unspecified: F32.A

## 2024-01-31 HISTORY — DX: Type 2 diabetes mellitus with ketoacidosis without coma: E11.10

## 2024-01-31 LAB — COMPREHENSIVE METABOLIC PANEL WITH GFR
ALT: 28 U/L (ref 0–44)
AST: 22 U/L (ref 15–41)
Albumin: 4.4 g/dL (ref 3.5–5.0)
Alkaline Phosphatase: 88 U/L (ref 38–126)
Anion gap: 17 — ABNORMAL HIGH (ref 5–15)
BUN: 7 mg/dL (ref 6–20)
CO2: 14 mmol/L — ABNORMAL LOW (ref 22–32)
Calcium: 9 mg/dL (ref 8.9–10.3)
Chloride: 106 mmol/L (ref 98–111)
Creatinine, Ser: 0.74 mg/dL (ref 0.44–1.00)
GFR, Estimated: >60 mL/min (ref >60)
Glucose, Bld: 407 mg/dL — ABNORMAL HIGH (ref 70–99)
Potassium: 3.6 mmol/L (ref 3.5–5.1)
Sodium: 137 mmol/L (ref 135–145)
Total Bilirubin: 0.4 mg/dL (ref 0.0–1.2)
Total Protein: 8.1 g/dL (ref 6.5–8.1)

## 2024-01-31 LAB — CBC
HCT: 45.6 % (ref 36.0–46.0)
Hemoglobin: 15.3 g/dL — ABNORMAL HIGH (ref 12.0–15.0)
MCH: 26.8 pg (ref 26.0–34.0)
MCHC: 33.6 g/dL (ref 30.0–36.0)
MCV: 80 fL (ref 80.0–100.0)
Platelets: 169 10*3/uL (ref 150–400)
RBC: 5.7 MIL/uL — ABNORMAL HIGH (ref 3.87–5.11)
RDW: 13.6 % (ref 11.5–15.5)
WBC: 6.4 10*3/uL (ref 4.0–10.5)
nRBC: 0 % (ref 0.0–0.2)

## 2024-01-31 LAB — URINALYSIS, ROUTINE W REFLEX MICROSCOPIC
Bilirubin Urine: NEGATIVE
Glucose, UA: >=500 mg/dL — AB
Hgb urine dipstick: NEGATIVE
Ketones, ur: 15 mg/dL — AB
Leukocytes,Ua: NEGATIVE
Nitrite: NEGATIVE
Protein, ur: NEGATIVE mg/dL
Specific Gravity, Urine: 1.01 (ref 1.005–1.030)
pH: 5.5 (ref 5.0–8.0)

## 2024-01-31 LAB — URINALYSIS, MICROSCOPIC (REFLEX): RBC / HPF: NONE SEEN RBC/hpf (ref 0–5)

## 2024-01-31 LAB — ACETAMINOPHEN LEVEL: Acetaminophen (Tylenol), Serum: <10 ug/mL — ABNORMAL LOW (ref 10–30)

## 2024-01-31 LAB — HEMOGLOBIN A1C
Hgb A1c MFr Bld: 12.8 % — ABNORMAL HIGH (ref 4.8–5.6)
Mean Plasma Glucose: 320.66 mg/dL

## 2024-01-31 LAB — I-STAT VENOUS BLOOD GAS, ED
Acid-base deficit: 8 mmol/L — ABNORMAL HIGH (ref 0.0–2.0)
Bicarbonate: 18 mmol/L — ABNORMAL LOW (ref 20.0–28.0)
Calcium, Ion: 1.19 mmol/L (ref 1.15–1.40)
HCT: 47 % — ABNORMAL HIGH (ref 36.0–46.0)
Hemoglobin: 16 g/dL — ABNORMAL HIGH (ref 12.0–15.0)
O2 Saturation: 93 %
Patient temperature: 98.3
Potassium: 3.5 mmol/L (ref 3.5–5.1)
Sodium: 142 mmol/L (ref 135–145)
TCO2: 19 mmol/L — ABNORMAL LOW (ref 22–32)
pCO2, Ven: 37.4 mmHg — ABNORMAL LOW (ref 44–60)
pH, Ven: 7.289 (ref 7.25–7.43)
pO2, Ven: 72 mmHg — ABNORMAL HIGH (ref 32–45)

## 2024-01-31 LAB — BASIC METABOLIC PANEL WITH GFR
Anion gap: 13 (ref 5–15)
BUN: 7 mg/dL (ref 6–20)
CO2: 19 mmol/L — ABNORMAL LOW (ref 22–32)
Calcium: 9 mg/dL (ref 8.9–10.3)
Chloride: 108 mmol/L (ref 98–111)
Creatinine, Ser: 0.72 mg/dL (ref 0.44–1.00)
GFR, Estimated: >60 mL/min (ref >60)
Glucose, Bld: 278 mg/dL — ABNORMAL HIGH (ref 70–99)
Potassium: 3.6 mmol/L (ref 3.5–5.1)
Sodium: 140 mmol/L (ref 135–145)

## 2024-01-31 LAB — GLUCOSE, CAPILLARY
Glucose-Capillary: 215 mg/dL — ABNORMAL HIGH (ref 70–99)
Glucose-Capillary: 266 mg/dL — ABNORMAL HIGH (ref 70–99)

## 2024-01-31 LAB — RAPID URINE DRUG SCREEN, HOSP PERFORMED
Amphetamines: NOT DETECTED
Barbiturates: NOT DETECTED
Benzodiazepines: NOT DETECTED
Cocaine: NOT DETECTED
Opiates: NOT DETECTED
Tetrahydrocannabinol: NOT DETECTED

## 2024-01-31 LAB — HCG, SERUM, QUALITATIVE: Preg, Serum: NEGATIVE

## 2024-01-31 LAB — SALICYLATE LEVEL: Salicylate Lvl: <7 mg/dL — ABNORMAL LOW (ref 7.0–30.0)

## 2024-01-31 LAB — PREGNANCY, URINE: Preg Test, Ur: NEGATIVE

## 2024-01-31 LAB — BETA-HYDROXYBUTYRIC ACID: Beta-Hydroxybutyric Acid: 0.54 mmol/L — ABNORMAL HIGH (ref 0.05–0.27)

## 2024-01-31 LAB — CBG MONITORING, ED: Glucose-Capillary: 236 mg/dL — ABNORMAL HIGH (ref 70–99)

## 2024-01-31 LAB — ETHANOL: Alcohol, Ethyl (B): 205 mg/dL — ABNORMAL HIGH (ref <10)

## 2024-01-31 MED ORDER — ENOXAPARIN SODIUM 40 MG/0.4ML IJ SOSY
40.0000 mg | PREFILLED_SYRINGE | INTRAMUSCULAR | Status: DC
  Administered 2024-01-31 – 2024-02-01 (×2): 40 mg via SUBCUTANEOUS
  Filled 2024-01-31 (×2): qty 0.4

## 2024-01-31 MED ORDER — ONDANSETRON HCL 4 MG/2ML IJ SOLN
4.0000 mg | Freq: Four times a day (QID) | INTRAMUSCULAR | Status: DC | PRN
Start: 2024-01-31 — End: 2024-02-02

## 2024-01-31 MED ORDER — INSULIN ASPART 100 UNIT/ML IJ SOLN
10.0000 [IU] | Freq: Once | INTRAMUSCULAR | Status: AC
  Administered 2024-01-31: 10 [IU] via INTRAVENOUS

## 2024-01-31 MED ORDER — ONDANSETRON HCL 4 MG PO TABS: 4.0000 mg | ORAL_TABLET | Freq: Four times a day (QID) | ORAL | Status: DC | PRN

## 2024-01-31 MED ORDER — TRAZODONE HCL 50 MG PO TABS
50.0000 mg | ORAL_TABLET | Freq: Every evening | ORAL | Status: DC | PRN
  Administered 2024-02-01: 50 mg via ORAL
  Filled 2024-01-31: qty 1

## 2024-01-31 MED ORDER — HYDRALAZINE HCL 50 MG PO TABS: 50.0000 mg | ORAL_TABLET | Freq: Four times a day (QID) | ORAL | Status: DC | PRN

## 2024-01-31 MED ORDER — POTASSIUM CHLORIDE CRYS ER 20 MEQ PO TBCR
40.0000 meq | EXTENDED_RELEASE_TABLET | Freq: Once | ORAL | Status: AC
Start: 2024-01-31 — End: 2024-01-31
  Administered 2024-01-31: 40 meq via ORAL
  Filled 2024-01-31: qty 2

## 2024-01-31 MED ORDER — SODIUM CHLORIDE 0.45 % IV SOLN: INTRAVENOUS | Status: AC

## 2024-01-31 MED ORDER — HYDROXYZINE HCL 25 MG PO TABS
50.0000 mg | ORAL_TABLET | Freq: Four times a day (QID) | ORAL | Status: DC | PRN
  Administered 2024-02-01 – 2024-02-02 (×2): 50 mg via ORAL
  Filled 2024-01-31 (×2): qty 2

## 2024-01-31 MED ORDER — LACTATED RINGERS IV BOLUS
1000.0000 mL | Freq: Once | INTRAVENOUS | Status: AC
  Administered 2024-01-31: 1000 mL via INTRAVENOUS

## 2024-01-31 MED ORDER — POTASSIUM CHLORIDE CRYS ER 20 MEQ PO TBCR
40.0000 meq | EXTENDED_RELEASE_TABLET | Freq: Once | ORAL | Status: AC
  Administered 2024-01-31: 40 meq via ORAL
  Filled 2024-01-31: qty 2

## 2024-01-31 MED ORDER — ACETAMINOPHEN 325 MG PO TABS
650.0000 mg | ORAL_TABLET | Freq: Four times a day (QID) | ORAL | Status: DC | PRN
  Administered 2024-02-02: 650 mg via ORAL
  Filled 2024-01-31: qty 2

## 2024-01-31 MED ORDER — INSULIN ASPART 100 UNIT/ML IJ SOLN
0.0000 [IU] | Freq: Three times a day (TID) | INTRAMUSCULAR | Status: DC
  Administered 2024-01-31 (×2): 5 [IU] via SUBCUTANEOUS
  Administered 2024-02-01: 11 [IU] via SUBCUTANEOUS
  Administered 2024-02-01: 5 [IU] via SUBCUTANEOUS

## 2024-01-31 MED ORDER — ACETAMINOPHEN 650 MG RE SUPP: 650.0000 mg | Freq: Four times a day (QID) | RECTAL | Status: DC | PRN

## 2024-01-31 MED ORDER — INSULIN GLARGINE-YFGN 100 UNIT/ML ~~LOC~~ SOLN
10.0000 [IU] | Freq: Once | SUBCUTANEOUS | Status: AC
  Administered 2024-01-31: 10 [IU] via SUBCUTANEOUS
  Filled 2024-01-31: qty 100

## 2024-01-31 MED ORDER — INFLUENZA VIRUS VACC SPLIT PF (FLUZONE) 0.5 ML IM SUSY
0.5000 mL | PREFILLED_SYRINGE | INTRAMUSCULAR | Status: AC
  Administered 2024-02-02: 0.5 mL via INTRAMUSCULAR
  Filled 2024-01-31: qty 0.5

## 2024-01-31 NOTE — Plan of Care (Signed)
   Problem: Education: Goal: Knowledge of General Education information will improve Description Including pain rating scale, medication(s)/side effects and non-pharmacologic comfort measures Outcome: Progressing   Problem: Health Behavior/Discharge Planning: Goal: Ability to manage health-related needs will improve Outcome: Progressing

## 2024-01-31 NOTE — H&P (Signed)
 History and Physical    Patient: Tammy Hayes:[id number] DOB: 1995/12/04 DOA: 01/31/2024 DOS: the patient was seen and examined on 01/31/2024 PCP: Morene Crocker, MD  Patient coming from: Home  Chief Complaint:  Chief Complaint  Patient presents with   Suicide Attempt   HPI: MANDISA PERSINGER is a 28 y.o. female with medical history significant of history of pediatric overweight, class II obesity, prediabetes, acanthosis nigricans, bilateral ovarian cyst, history of learning disability, HSIL, gonorrhea, depression, pancytopenia who was taken to the emergency department due to attempting to commit suicide by cutting her throat.  She was stopped by her sister.  Once in the ED, it was noticed that the patient had a glucose over 400 mg/dL with decreased anion gap, CO2 and ketosis.  She was treated in the emergency department and we were asked to admit to optimize medical management before being transferred to the psychiatric hospital.  98.2 F, pulse 114, respiration 18, BP 120/62 mmHg O2 sat 98% on room air.  The patient received 1000 mL of LR bolus, Semglee 10 units SQ x 1  Lab work: Her urine analysis showed glucosuria greater than 500 and ketones of 15 mg/dL.  There were few bacteria on microscopic examination.  UDS was negative.  Urine pregnancy test was negative as well.  CBC showed white count 6.4, hemoglobin 15.3 g/dL platelets 956.  CMP showed normal values with the exception of a CO2 of 14 mmol/L with an anion gap of 17 and a glucose level of 407.  Unremarkable acetaminophen and salicylate level.  Ethyl alcohol was 205 mg/dL.  Venous pH was 7.29, pCO2 37.4 and pO2 of 72 mmHg.  Bicarbonate was 18 and the CO2 19 mmol/L.   Review of Systems: As mentioned in the history of present illness. All other systems reviewed and are negative. Past Medical History:  Diagnosis Date   Bilateral ovarian cysts    Depression    Pediatric overweight 07/13/2013   Pre-diabetes    Past Surgical History:   Procedure Laterality Date   EYE MUSCLE SURGERY     in 3rd grade. No reported residual weakness.    Social History:  reports that she has been smoking cigarettes. She has never used smokeless tobacco. She reports that she does not currently use alcohol. She reports current drug use. Drug: Marijuana.  Allergies  Allergen Reactions   Penicillins Anaphylaxis, Itching and Other (See Comments)    Did it involve swelling of the face/tongue/throat, SOB, or low BP? Yes Did it involve sudden or severe rash/hives, skin peeling, or any reaction on the inside of your mouth or nose? Y Did you need to seek medical attention at a hospital or doctor's office? Y When did it last happen?     Childhood. If all above answers are "NO", may proceed with cephalosporin use.     Family History  Problem Relation Age of Onset   Depression Mother        grandparents, aunts/uncles.    Heart disease Other        aunts/uncles   Hypertension Other        aunts/uncles, grandparents   Kidney disease Other        aunts/uncles, grandparents   Stroke Other        aunts/uncles   Cancer Neg Hx     Prior to Admission medications   Medication Sig Start Date End Date Taking? Authorizing Provider  etonogestrel (NEXPLANON) 68 MG IMPL implant 1 each by Subdermal route once.  Yes [provider]    Physical Exam: Vitals:   01/31/24 0830 01/31/24 0845 01/31/24 1120 01/31/24 1315  BP: (!) 118/56 (!) 118/56 133/86 (!) 140/91  Pulse: (!) 101 (!) 101 (!) 103 (!) 101  Resp:  18 18   Temp:  97.8 F (36.6 C) 97.8 F (36.6 C) 98.6 F (37 C)  TempSrc:  Oral Oral   SpO2:  98% 100% 100%  Weight:      Height:       Physical Exam Constitutional:      General: She is awake. She is not in acute distress.    Appearance: She is obese. She is ill-appearing.  HENT:     Head: Normocephalic.     Nose: Nose normal. No rhinorrhea.     Mouth/Throat:     Mouth: Mucous membranes are moist.  Eyes:     General: No  scleral icterus.    Pupils: Pupils are equal, round, and reactive to light.  Neck:     Vascular: No JVD.  Cardiovascular:     Rate and Rhythm: Normal rate and regular rhythm.     Heart sounds: S1 normal and S2 normal.  Pulmonary:     Effort: Pulmonary effort is normal.     Breath sounds: Normal breath sounds. No wheezing, rhonchi or rales.  Abdominal:     General: Bowel sounds are normal. There is no distension.     Palpations: Abdomen is soft.     Tenderness: There is no abdominal tenderness. There is no right CVA tenderness or left CVA tenderness.  Musculoskeletal:     Cervical back: Neck supple.     Right lower leg: No edema.     Left lower leg: No edema.  Skin:    General: Skin is warm and dry.  Neurological:     General: No focal deficit present.     Mental Status: She is alert and oriented to person, place, and time.  Psychiatric:        Mood and Affect: Mood normal.        Behavior: Behavior normal. Behavior is cooperative.     Data Reviewed:  Results are pending, will review when available.  Assessment and Plan: Principal Problem:   Diabetes mellitus with hyperglycemia (HCC) Status post:   DKA (diabetic ketoacidosis) (HCC)  MedSurg/observation. Carbohydrate modified diet. Begin Semglee 10 units SQ daily. CBG monitoring with RI SS. Check C-peptide level. Check hemoglobin A1c. Will consult diabetes coordinator. Transitioned to SQ insulin per Endo tool.  Active Problems:   Suicide attempt [facility]) In the setting of: Anxiety and depression One-to-one monitoring. Currently not on medical therapy. Currently not enrolled in counseling. Behavioral health already aware. She will be admitted to their service once cleared medically. She has had trouble sleeping in the evening. Trazodone 50 mg p.o. nightly as needed ordered. May also use    Hypertension As needed oral hydralazine 50 mg every 6 hours. Would benefit from lifestyle modifications.    Class 2  obesity Current BMI 39.21 kg/m. Would benefit from lifestyle modifications. Follow-up closely with PCP and/or bariatric clinic.     Advance Care Planning:   Code Status: Full Code   Consults: Behavioral health.  Family Communication:   Severity of Illness: The appropriate patient status for this patient is OBSERVATION. Observation status is judged to be reasonable and necessary in order to provide the required intensity of service to ensure the patient's safety. The patient's presenting symptoms, physical exam findings, and initial radiographic  and laboratory data in the context of their medical condition is felt to place them at decreased risk for further clinical deterioration. Furthermore, it is anticipated that the patient will be medically stable for discharge from the hospital within 2 midnights of admission.   Author: Bobette Mo, MD 01/31/2024 1:36 PM  For on call review [email or web address].   This document was prepared using Dragon voice recognition software and may contain some unintended transcription errors.

## 2024-01-31 NOTE — Progress Notes (Signed)
 Plan of Care Note for accepted transfer   Patient: Tammy Hayes MRN: [id number]   DOA: 01/31/2024  Facility requesting transfer: [facility].  Requesting Provider: Rolan Bucco, MD. Reason for transfer: Depression, suicidal ideations, type 2 diabetes with hyperglycemia. Facility course:  28 year old female with a past medical history of depression, class II obesity, hypertension, acanthosis nigricans, iron deficiency anemia, menorrhagia, pancytopenia, prediabetes who presented to the emergency department with complaints of suicidal ideations, found to be hyperglycemic with labs trending towards DKA, but subsequently reversed with 10 units of SQ insulin and IV fluids.  Plan of care: The patient is accepted for admission to Med-surg  unit, at [facility].  The hospitalist service will observe the patient and notify psychiatry of her arrival. Recommendations while in the ED at [facility]. : Change diet from regular to carb modified. Semglee 10 units SQ x 1 dose now. CBG AC/bedtime with RI SS. Half NS at 100 mL/h x 20 hours. K-Lor 40 mEq p.o. x 1 dose. Add on C-peptide level, magnesium and phosphorus to previous blood sample.  Author: Bobette Mo, MD 01/31/2024  Check [email or web address] for on-call coverage.  Nursing staff, Please call [facility] Admits & Consults System-Wide number on Amion as soon as patient's arrival, so appropriate admitting provider can evaluate the pt.
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
## 2024-01-31 NOTE — ED Notes (Signed)
 Voluntary Consent signed by patient at this time.

## 2024-01-31 NOTE — ED Notes (Signed)
 TTS done with their evaluation at this time.

## 2024-01-31 NOTE — ED Triage Notes (Signed)
 BIB police this morning for mental health evaluation/ SI. Patient states two days ago she tried to cut her throat, but was stopped by her sister. Patient states she had thoughts of cutting her throat/ taking large quantities of pills last night.

## 2024-01-31 NOTE — ED Provider Notes (Signed)
 Care was taken over from Dr. Blinda Leatherwood.  Patient presented with suicidal ideations.  She is also noted to be under the influence of alcohol.  She was noted to be in DKA with an elevated blood glucose, anion gap and low bicarb.  She has been evaluated by psychiatry and recommended inpatient treatment.  She was given IV fluids and insulin.  Her anion gap cleared on a recheck of her blood work.  However she still has a bit low bicarb.  I talked with her and she is not on any medications for diabetes at home.  She has been diagnosed as prediabetic in the past.  Given that she does not have diabetes education and is borderline DKA, discussed with Dr. Robb Matar with the hospitalist service who recommends admission for full correction of her hyperglycemia and diabetes education/management.  Hopefully she will be cleared for psychiatric management tomorrow and can be placed.   Rolan Bucco, MD 01/31/24 1037

## 2024-01-31 NOTE — ED Notes (Signed)
 TTS performing evaluation via Telehealth monitor.

## 2024-01-31 NOTE — ED Notes (Signed)
 Patient changed into behavioral health burgundy scrubs at this time. Personal belongings/ clothes placed in personal belongings bag and placed in cabinet above printer.

## 2024-01-31 NOTE — ED Provider Notes (Addendum)
 [place] [facility] Provider Note   CSN: [id number] Arrival date & time: 01/31/24  0350     History  Chief Complaint  Patient presents with   Suicide Attempt    Tammy Hayes is a 28 y.o. female.  Patient presents to the emergency department for psychiatric evaluation.  She reports that she does have a history of depression and was previously admitted to behavioral health a couple of years ago.  She was discharged on medications but never had any follow-up and is no longer taking meds.  Patient has been having increased depression and thoughts about killing herself.  She reportedly held a knife to her throat the other night but her sister stopped her and she is still considering harming herself by either cutting her throat or overdosing on pills.  She thinks she needs to be readmitted to behavioral health.       Home Medications Prior to Admission medications   Medication Sig Start Date End Date Taking? Authorizing Provider  etonogestrel (NEXPLANON) 68 MG IMPL implant 1 each by Subdermal route once.    [provider]  FLUoxetine (PROZAC) 20 MG capsule Take 1 capsule (20 mg total) by mouth daily. Patient not taking: Reported on 11/04/2022 05/23/22 06/22/22  Lauree Chandler, NP  omeprazole (PRILOSEC) 20 MG capsule Take 1 capsule (20 mg total) by mouth daily. 02/27/23   Horton, Mayer Masker, MD  ondansetron (ZOFRAN) 4 MG tablet Take 1 tablet (4 mg total) by mouth every 8 (eight) hours as needed for nausea or vomiting. Patient not taking: Reported on 11/04/2022 10/17/22   Lonell Grandchild, MD  oseltamivir (TAMIFLU) 75 MG capsule Take 1 capsule (75 mg total) by mouth every 12 (twelve) hours. Patient not taking: Reported on 11/04/2022 10/17/22   Lonell Grandchild, MD      Allergies    Penicillins    Review of Systems   Review of Systems  Physical Exam Updated Vital Signs BP 128/62 (BP Location: Right Arm)   Pulse (!) 114   Temp 98.2 F  (36.8 C) (Oral)   Resp 18   Ht 5\' 6"  (1.676 m)   Wt 102.5 kg   SpO2 98%   BMI 36.48 kg/m  Physical Exam Vitals and nursing note reviewed.  Constitutional:      General: She is not in acute distress.    Appearance: She is well-developed.  HENT:     Head: Normocephalic and atraumatic.     Mouth/Throat:     Mouth: Mucous membranes are moist.  Eyes:     General: Vision grossly intact. Gaze aligned appropriately.     Extraocular Movements: Extraocular movements intact.     Conjunctiva/sclera: Conjunctivae normal.  Cardiovascular:     Rate and Rhythm: Normal rate and regular rhythm.     Pulses: Normal pulses.     Heart sounds: Normal heart sounds, S1 normal and S2 normal. No murmur heard.    No friction rub. No gallop.  Pulmonary:     Effort: Pulmonary effort is normal. No respiratory distress.     Breath sounds: Normal breath sounds.  Abdominal:     General: Bowel sounds are normal.     Palpations: Abdomen is soft.     Tenderness: There is no abdominal tenderness. There is no guarding or rebound.     Hernia: No hernia is present.  Musculoskeletal:        General: No swelling.     Cervical back: Full  passive range of motion without pain, normal range of motion and neck supple. No spinous process tenderness or muscular tenderness. Normal range of motion.     Right lower leg: No edema.     Left lower leg: No edema.  Skin:    General: Skin is warm and dry.     Capillary Refill: Capillary refill takes less than 2 seconds.     Findings: No ecchymosis, erythema, rash or wound.  Neurological:     General: No focal deficit present.     Mental Status: She is alert and oriented to person, place, and time.     GCS: GCS eye subscore is 4. GCS verbal subscore is 5. GCS motor subscore is 6.     Cranial Nerves: Cranial nerves 2-12 are intact.     Sensory: Sensation is intact.     Motor: Motor function is intact.     Coordination: Coordination is intact.  Psychiatric:        Attention  and Perception: Attention normal.        Mood and Affect: Mood normal.        Speech: Speech normal.        Behavior: Behavior normal.        Thought Content: Thought content includes suicidal ideation.     ED Results / Procedures / Treatments   Labs (all labs ordered are listed, but only abnormal results are displayed) Labs Reviewed  CBC - Abnormal; Notable for the following components:      Result Value   RBC 5.70 (*)    Hemoglobin 15.3 (*)    All other components within normal limits  COMPREHENSIVE METABOLIC PANEL WITH GFR - Abnormal; Notable for the following components:   CO2 14 (*)    Glucose, Bld 407 (*)    Anion gap 17 (*)    All other components within normal limits  ETHANOL - Abnormal; Notable for the following components:   Alcohol, Ethyl (B) 205 (*)    All other components within normal limits  ACETAMINOPHEN LEVEL - Abnormal; Notable for the following components:   Acetaminophen (Tylenol), Serum <10 (*)    All other components within normal limits  SALICYLATE LEVEL - Abnormal; Notable for the following components:   Salicylate Lvl <7.0 (*)    All other components within normal limits  RAPID URINE DRUG SCREEN, HOSP PERFORMED  PREGNANCY, URINE  HCG, SERUM, QUALITATIVE  BETA-HYDROXYBUTYRIC ACID  URINALYSIS, ROUTINE W REFLEX MICROSCOPIC  I-STAT VENOUS BLOOD GAS, ED    EKG None  Radiology No results found.  Procedures Procedures    Medications Ordered in ED Medications  lactated ringers bolus 1,000 mL (has no administration in time range)  insulin aspart (novoLOG) injection 10 Units (has no administration in time range)    ED Course/ Medical Decision Making/ A&P                                 Medical Decision Making Amount and/or Complexity of Data Reviewed Labs: ordered.  Risk Prescription drug management.   Resents to the emergency department for evaluation of suicidal ideation.  Patient does have an active plan.  She wishes to be admitted  to a psychiatric hospital to get back on medications, is voluntary at this time.  Will pursue TTS consultation for psychiatric treatment.  Addendum: Patient's lab work has returned and she is hyperglycemic.  Additionally she appears to be mildly acidotic with  an anion gap of 17.  Will give IV fluids, insulin, check VBG and beta hCG.  Differential diagnosis would be mild diabetic ketoacidosis versus alcoholic ketoacidosis.  Salicylate level was undetectable. She is intoxicated.  Will signout oncoming ER physician to follow these results and determine if she is appropriate for disposition to inpatient psychiatry or requires further medical treatment.      Final Clinical Impression(s) / ED Diagnoses Final diagnoses:  Suicidal ideation    Rx / DC Orders ED Discharge Orders     None         Lochlyn Zullo, Canary Brim, MD 01/31/24 7829    Gilda Crease, MD 01/31/24 [phone]

## 2024-01-31 NOTE — ED Notes (Signed)
 Belongings given to [facility] staff

## 2024-02-01 ENCOUNTER — Other Ambulatory Visit (HOSPITAL_COMMUNITY): Payer: Self-pay

## 2024-02-01 ENCOUNTER — Telehealth (HOSPITAL_COMMUNITY): Payer: Self-pay | Admitting: Pharmacy Technician

## 2024-02-01 DIAGNOSIS — R45851 Suicidal ideations: Secondary | ICD-10-CM | POA: Diagnosis not present

## 2024-02-01 DIAGNOSIS — T1491XA Suicide attempt, initial encounter: Secondary | ICD-10-CM | POA: Diagnosis not present

## 2024-02-01 DIAGNOSIS — E1110 Type 2 diabetes mellitus with ketoacidosis without coma: Secondary | ICD-10-CM

## 2024-02-01 DIAGNOSIS — E1165 Type 2 diabetes mellitus with hyperglycemia: Secondary | ICD-10-CM | POA: Diagnosis not present

## 2024-02-01 DIAGNOSIS — I10 Essential (primary) hypertension: Secondary | ICD-10-CM | POA: Diagnosis not present

## 2024-02-01 LAB — COMPREHENSIVE METABOLIC PANEL WITH GFR
ALT: 23 U/L (ref 0–44)
AST: 19 U/L (ref 15–41)
Albumin: 3.5 g/dL (ref 3.5–5.0)
Alkaline Phosphatase: 44 U/L (ref 38–126)
Anion gap: 8 (ref 5–15)
BUN: 12 mg/dL (ref 6–20)
CO2: 20 mmol/L — ABNORMAL LOW (ref 22–32)
Calcium: 8.9 mg/dL (ref 8.9–10.3)
Chloride: 105 mmol/L (ref 98–111)
Creatinine, Ser: 0.67 mg/dL (ref 0.44–1.00)
GFR, Estimated: >60 mL/min (ref >60)
Glucose, Bld: 215 mg/dL — ABNORMAL HIGH (ref 70–99)
Potassium: 3.7 mmol/L (ref 3.5–5.1)
Sodium: 133 mmol/L — ABNORMAL LOW (ref 135–145)
Total Bilirubin: 0.9 mg/dL (ref 0.0–1.2)
Total Protein: 6.3 g/dL — ABNORMAL LOW (ref 6.5–8.1)

## 2024-02-01 LAB — GLUCOSE, CAPILLARY
Glucose-Capillary: 227 mg/dL — ABNORMAL HIGH (ref 70–99)
Glucose-Capillary: 344 mg/dL — ABNORMAL HIGH (ref 70–99)
Glucose-Capillary: 260 mg/dL — ABNORMAL HIGH (ref 70–99)
Glucose-Capillary: 296 mg/dL — ABNORMAL HIGH (ref 70–99)

## 2024-02-01 LAB — CBC
HCT: 43.4 % (ref 36.0–46.0)
Hemoglobin: 13.6 g/dL (ref 12.0–15.0)
MCH: 26.4 pg (ref 26.0–34.0)
MCHC: 31.3 g/dL (ref 30.0–36.0)
MCV: 84.3 fL (ref 80.0–100.0)
Platelets: 143 10*3/uL — ABNORMAL LOW (ref 150–400)
RBC: 5.15 MIL/uL — ABNORMAL HIGH (ref 3.87–5.11)
RDW: 13.8 % (ref 11.5–15.5)
WBC: 5.6 10*3/uL (ref 4.0–10.5)
nRBC: 0 % (ref 0.0–0.2)

## 2024-02-01 LAB — C-PEPTIDE: C-Peptide: 1.9 ng/mL (ref 1.1–4.4)

## 2024-02-01 MED ORDER — INSULIN ASPART 100 UNIT/ML IJ SOLN
0.0000 [IU] | Freq: Every day | INTRAMUSCULAR | Status: DC
Start: 2024-02-01 — End: 2024-02-02
  Administered 2024-02-01: 5 [IU] via SUBCUTANEOUS

## 2024-02-01 MED ORDER — INSULIN ASPART 100 UNIT/ML IJ SOLN
0.0000 [IU] | Freq: Three times a day (TID) | INTRAMUSCULAR | Status: DC
  Administered 2024-02-01 – 2024-02-02 (×2): 8 [IU] via SUBCUTANEOUS
  Administered 2024-02-02: 5 [IU] via SUBCUTANEOUS

## 2024-02-01 MED ORDER — INSULIN ASPART 100 UNIT/ML IJ SOLN
4.0000 [IU] | Freq: Three times a day (TID) | INTRAMUSCULAR | Status: DC
  Administered 2024-02-01: 4 [IU] via SUBCUTANEOUS

## 2024-02-01 MED ORDER — INSULIN GLARGINE-YFGN 100 UNIT/ML ~~LOC~~ SOLN
15.0000 [IU] | Freq: Every day | SUBCUTANEOUS | Status: DC
  Administered 2024-02-01: 15 [IU] via SUBCUTANEOUS
  Filled 2024-02-01: qty 0.15

## 2024-02-01 MED ORDER — INSULIN GLARGINE-YFGN 100 UNIT/ML ~~LOC~~ SOLN
20.0000 [IU] | Freq: Every day | SUBCUTANEOUS | Status: DC
  Filled 2024-02-01: qty 0.2

## 2024-02-01 MED ORDER — LIVING WELL WITH DIABETES BOOK
Freq: Once | Status: AC
  Filled 2024-02-01: qty 1

## 2024-02-01 NOTE — Plan of Care (Signed)

## 2024-02-01 NOTE — Inpatient Diabetes Management (Signed)
 Inpatient Diabetes Program Recommendations  AACE/ADA: New Consensus Statement on Inpatient Glycemic Control (2015)  Target Ranges:  Prepandial:   less than 140 mg/dL      Peak postprandial:   less than 180 mg/dL (1-2 hours)      Critically ill patients:  140 - 180 mg/dL   Lab Results  Component Value Date   GLUCAP 344 (H) 02/01/2024   HGBA1C 12.8 (H) 01/31/2024    Review of Glycemic Control  Diabetes history: Pre DM in the past  Current orders for Inpatient glycemic control:  Semglee 15 units Daily Novolog 0-15 units tid  Inpatient Diabetes Program Recommendations:    -   Increase Semglee to 20 units -   Add Novolog 4 units tid meal coverage if eating >50% of meals  Spoke with pt at bedside regarding new diabetes diagnosis.  Discussed A1C results (12.8% on 4/6) and explained what an A1C is. Pt reports having a PCP but it has been approx 2 years since she has followed up. Discussed basic pathophysiology of DM Type 2, basic home care, importance of checking CBGs and maintaining good CBG control to prevent long-term and short-term complications. Reviewed glucose and A1C goals. Reviewed signs and symptoms of hyperglycemia and hypoglycemia along with treatment for both. Discussed impact of nutrition, exercise, stress, sickness, and medications on diabetes control. Reviewed Living Well with diabetes booklet and encouraged patient to read through entire book. Informed patient that she will need to be on insulin when she is discharged. Reviewed and demonstrated how to operate insulin pen. Patient verbalized understanding of information discussed and has no further questions at this time related to diabetes.  RNs to provide ongoing basic DM education at bedside with this patient and engage patient to actively check blood glucose and administer insulin injections.   Benefits check on  Basal insulins 70/30 insulins CGM  Thanks,  Christena Deem RN, MSN, BC-ADM Inpatient Diabetes  Coordinator Team Pager [phone] (8a-5p)

## 2024-02-01 NOTE — Progress Notes (Signed)
 PROGRESS NOTE    Tammy Hayes  WUJ:[id number] DOB: Jul 24, 1996 DOA: 01/31/2024 PCP: Morene Crocker, MD    Brief Narrative:   Tammy Hayes is a 28 y.o. female with past medical history significant for major depressive disorder, previous history of prediabetes, now type 2 diabetes mellitus, obesity who presented to [facility] ED on 01/31/2024 from home after being brought in by police after sister called for concern regarding attempt at suicide.  Patient attempting to commit suicide by cutting her throat which was stopped by her sister.  Also apparently patient stating she wanted to overdose on "pills"; as well as being under the influence of alcohol.  In the ED, temperature 98.2 F, HR 114, RR 18, BP 128/62, SpO2 98% on room air.  WC 6.4, hemoglobin 15.3, platelet count 169.  Sodium 137, potassium 3.6, chloride 106, CO2 14, glucose 407, BUN 7, creatinine 0.74.  Anion gap 17.  AST 22, ALT 28, total bilirubin 0.4.  Urinalysis with greater than 500 glucose, 15 ketones, otherwise unrevealing.  hCG negative.  EtOH level 205.  Salicylate level less than 7.0.  UDS negative.  Psychiatry consulted.  Duration consulted for admission for further evaluation and management of DKA, new diagnosis of diabetes melitis.  Assessment & Plan:   Diabetic ketoacidosis Type 2 diabetes mellitus Glucose 407, anion gap 17 with 15 ketones and greater than 500 glucose on urinalysis in the ED.  Previous history of prediabetes.  Hemoglobin A1c 12.8. --Diabetic educator following, appreciate assistance -- Semglee 20u [place] daily -- Novolog 4u TIDAC if eating >50% of meals -- Moderate SSI for coverage -- CBGs qAC/HS  Suicide attempt/ideation Major depressive disorder -- Psychiatry following, appreciate assistance -- Continue one-to-one sitter, suicide precautions -- Pending inpatient psychiatry placement  EtOH intoxication EtOH level 205 on admission.  Counseled on need for complete cessation.  Obesity, class  III Body mass index is 39.21 kg/m.    DVT prophylaxis: enoxaparin (LOVENOX) injection 40 mg Start: 01/31/24 2200    Code Status: Full Code Family Communication: No family present at bedside  Disposition Plan:  Level of care: Med-Surg Status is: Observation The patient remains OBS appropriate and will d/c before 2 midnights.    Consultants:  Psychiatry  Procedures:  None  Antimicrobials:  None   Subjective: Patient seen examined bedside, lying in bed.  Sitter present at bedside.  No complaints this morning.  Denies active SI/HI.  Discussed starting on insulin for her diabetes.  Seen by diabetic educator.  No other specific complaints or concerns at this time.  Denies headache, no visual changes, no chest pain, no palpitations, no shortness of breath, no abdominal pain, no fever/chills/night sweats, no nausea/vomiting/diarrhea, no focal weakness, no fatigue, no paresthesia.  No acute events overnight per nursing staff.  Objective: Vitals:   02/01/24 0111 02/01/24 0550 02/01/24 0747 02/01/24 1218  BP: 132/77 134/74 (!) 143/81 (!) 145/85  Pulse: 93 82 93 90  Resp: 17 14 16 18   Temp: 97.8 F (36.6 C)  98.5 F (36.9 C) 98.2 F (36.8 C)  TempSrc: Oral  Oral   SpO2: 100% 99% 97% 99%  Weight:      Height:        Intake/Output Summary (Last 24 hours) at 02/01/2024 1634 Last data filed at 02/01/2024 1100 Gross per 24 hour  Intake 1080 ml  Output --  Net 1080 ml   Filed Weights   01/31/24 0441 01/31/24 1340  Weight: 102.5 kg 110.2 kg    Examination:  Physical Exam: GEN: NAD, alert and oriented x 3, obese HEENT: NCAT, PERRL, EOMI, sclera clear, MMM PULM: CTAB w/o wheezes/crackles, normal respiratory effort, on room air CV: RRR w/o M/G/R GI: abd soft, NTND, NABS, no R/G/M MSK: no peripheral edema, muscle strength globally intact 5/5 bilateral upper/lower extremities NEURO: CN II-XII intact, no focal deficits, sensation to light touch intact PSYCH: normal mood/affect,  denies SI/HI Integumentary: dry/intact, no rashes or wounds    Data Reviewed: I have personally reviewed following labs and imaging studies  CBC: Recent Labs  Lab 01/31/24 0448 01/31/24 0700 02/01/24 0403  WBC 6.4  --  5.6  HGB 15.3* 16.0* 13.6  HCT 45.6 47.0* 43.4  MCV 80.0  --  84.3  PLT 169  --  143*   Basic Metabolic Panel: Recent Labs  Lab 01/31/24 0448 01/31/24 0700 01/31/24 0851 02/01/24 0403  NA 137 142 140 133*  K 3.6 3.5 3.6 3.7  CL 106  --  108 105  CO2 14*  --  19* 20*  GLUCOSE 407*  --  278* 215*  BUN 7  --  7 12  CREATININE 0.74  --  0.72 0.67  CALCIUM 9.0  --  9.0 8.9   GFR: Estimated Creatinine Clearance: 135.3 mL/min (by C-G formula based on SCr of 0.67 mg/dL). Liver Function Tests: Recent Labs  Lab 01/31/24 0448 02/01/24 0403  AST 22 19  ALT 28 23  ALKPHOS 88 44  BILITOT 0.4 0.9  PROT 8.1 6.3*  ALBUMIN 4.4 3.5   No results for input(s): "LIPASE", "AMYLASE" in the last 168 hours. No results for input(s): "AMMONIA" in the last 168 hours. Coagulation Profile: No results for input(s): "INR", "PROTIME" in the last 168 hours. Cardiac Enzymes: No results for input(s): "CKTOTAL", "CKMB", "CKMBINDEX", "TROPONINI" in the last 168 hours. BNP (last 3 results) No results for input(s): "PROBNP" in the last 8760 hours. HbA1C: Recent Labs    01/31/24 0448  HGBA1C 12.8*   CBG: Recent Labs  Lab 01/31/24 1114 01/31/24 1623 01/31/24 2126 02/01/24 0744 02/01/24 1215  GLUCAP 236* 215* 266* 227* 344*   Lipid Profile: No results for input(s): "CHOL", "HDL", "LDLCALC", "TRIG", "CHOLHDL", "LDLDIRECT" in the last 72 hours. Thyroid Function Tests: No results for input(s): "TSH", "T4TOTAL", "FREET4", "T3FREE", "THYROIDAB" in the last 72 hours. Anemia Panel: No results for input(s): "VITAMINB12", "FOLATE", "FERRITIN", "TIBC", "IRON", "RETICCTPCT" in the last 72 hours. Sepsis Labs: No results for input(s): "PROCALCITON", "LATICACIDVEN" in the last  168 hours.  No results found for this or any previous visit (from the past 240 hours).       Radiology Studies: No results found.      Scheduled Meds:  enoxaparin (LOVENOX) injection  40 mg Subcutaneous Q24H   influenza vac split trivalent PF  0.5 mL Intramuscular Tomorrow-1000   insulin aspart  0-15 Units Subcutaneous TID WC   insulin aspart  0-5 Units Subcutaneous QHS   insulin aspart  4 Units Subcutaneous TID WC   [START ON 02/02/2024] insulin glargine-yfgn  20 Units Subcutaneous Daily   living well with diabetes book   Does not apply Once   Continuous Infusions:   LOS: 0 days    Time spent: 51 minutes spent on 02/01/2024 caring for this patient face-to-face including chart review, ordering labs/tests, documenting, discussion with nursing staff, consultants, updating family and interview/physical exam    Alvira Philips [place], DO [facility] Available via Epic secure chat 7am-7pm After these hours, please refer to coverage provider listed on  [email or web address] 02/01/2024, 4:34 PM

## 2024-02-01 NOTE — Telephone Encounter (Signed)
 Pharmacy Patient Advocate Encounter   Received notification from Inpatient Request that prior authorization for FreeStyle Libre 3 Sensor is required/requested.   Insurance verification completed.   The patient is insured through [facility] .   Per test claim: PA required; PA submitted to above mentioned insurance via CoverMyMeds Key/confirmation #/EOC [id number] Status is pending

## 2024-02-01 NOTE — Telephone Encounter (Signed)
 Pharmacy Patient Advocate Encounter  Insurance verification completed.    The patient is insured through [employer] [place].     Ran test claim for LANTUS SOLOSTAR and the current 30 day co-pay is 4.00.  Ran test claim for HUMULIN 70/30 KWIKPEN and the current 30 day co-pay is 4.00.  Ran test claim for DEXCOM G7 SENSORS AND PA IS REQUIRED.  Ran test claim for FREESTYLE LIBRE 3 SENSORS AND PA IS REQUIRED.   This test claim was processed through [facility]- copay amounts may vary at other pharmacies due to pharmacy/plan contracts, or as the patient moves through the different stages of their insurance plan.

## 2024-02-01 NOTE — Consult Note (Signed)
 [facility] Psychiatric Consult Initial  Patient Name: .Tammy Hayes  MRN: [id number]  DOB: 04-20-1996  Consult Order details:  Orders (From admission, onward)     Start     Ordered   01/31/24 1422  IP CONSULT TO PSYCHIATRY       Ordering Provider: Bobette Mo, MD  Provider:  (Not yet assigned)  Question Answer Comment  Location [facility]   Reason for Consult? SUICIDAL.  Tried to call her throat, but her sister stopped her.  Admitted to medicine due to prediabetes turning into type 2 diabetes with early DKA, which was reversed in the ED.      01/31/24 1421   01/31/24 0445  CONSULT TO CALL ACT TEAM       Ordering Provider: Gilda Crease, MD  Provider:  (Not yet assigned)  Question:  Reason for Consult?  Answer:  Psych consult   01/31/24 0445             Mode of Visit: In person    Psychiatry Consult Evaluation  Service Date: February 01, 2024 LOS:  LOS: 0 days  Chief Complaint Suicide attempt; attempted to cut her throat but was stopped by her sister.   Primary Psychiatric Diagnoses  MDD, severe 2.  Alcohol abuse moderate 3.  SIDD  Assessment  Tammy Hayes is a 28 y.o. female admitted: Medicallyfor 01/31/2024  4:34 AM for suicide attempt. She carries the psychiatric diagnoses of MDD,  and has a past medical history of  T2DM.   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 She meets criteria for SIMD based on clinical presentation that is marked by depressive symptoms and subsides with resolution of symptoms. Her labs were also consistent with acute ingestion of alcohol use.  She also has symptoms consistent with MDD to include anhedonia, guilty, hopelessness, suicidal ideations, sadness, insomnia. Current outpatient psychotropic medications none and historically she has had a fair response to these medications. She was not compliant with medications prior  to admission as evidenced by patients report and poor follow up with outpatient provider. On initial examination, patient reports increased stressors that lead to her suicide attempt to include unemployment, lack of accomplishments, sense of failure and ongoing letdown.  Please see plan below for detailed recommendations.   She denies any acute symptoms of mania at this time to include impulsivity, grandiosity, mood lability, hypersexuality.  She does not present with any of the above symptoms on this evaluation.  She denies any acute psychosis, paranoia.  She does not appear to be displaying any or responding to internal stimuli, external stimuli, or exhibiting delusional thought disorder.  Patient denies any access to weapons. In regards to her alcohol intake she is unable   She reports moderate sleep and fair appetite.  She also is receiving services through [facility] psychiatry, and her caregiver is available to assist with medication management in which she reports compliance with most of her appointments (chart verified).  Patient denies any auditory and/or visual hallucinations, does not appear to be responding to internal or external stimuli.  There is no evidence of delusional thought content and patient appears to answer all questions appropriately.  At this time patient appears to be psychiatrically stable to discharge home, with support system services in place.      Diagnoses:  Active Hospital problems: Principal Problem:   Diabetes mellitus with hyperglycemia (HCC) Active Problems:  Hypertension   Anxiety and depression   Suicide attempt (HCC)   Class 2 obesity   DKA (diabetic ketoacidosis) (HCC)    Plan   ## Psychiatric Medication Recommendations:  Start fluoxetine 10mg  po daily x 2 doses, then increase Fluoxetine 20mg  po daily.  -Start hydroxyzine 25mg  po TID prn.   ## Medical Decision Making Capacity: Not specifically addressed in this encounter  ## Further Work-up:  --  None  TSH, B12, folate, EKG, While pt on Qtc prolonging medications, please monitor & replete K+ to 4 and Mg2+ to 2, TOC consult for substance abuse resources, U/A, or UDS -- EKG not obtained, will order.  -- Pertinent labwork reviewed earlier this admission includes: Sodium (133) and platelets (143) beta hydroybutyric acid (0.54)   ## Disposition:-- We recommend inpatient psychiatric hospitalization when medically cleared. Patient is under voluntary admission status at this time; please IVC if attempts to leave hospital.  ## Behavioral / Environmental: - No specific recommendations at this time.     ## Safety and Observation Level:  - Based on my clinical evaluation, I estimate the patient to be at moderate risk of self harm in the current setting. - At this time, we recommend  1:1 Observation. This decision is based on my review of the chart including patient's history and current presentation, interview of the patient, mental status examination, and consideration of suicide risk including evaluating suicidal ideation, plan, intent, suicidal or self-harm behaviors, risk factors, and protective factors. This judgment is based on our ability to directly address suicide risk, implement suicide prevention strategies, and develop a safety plan while the patient is in the clinical setting. Please contact our team if there is a concern that risk level has changed.  CSSR Risk Category:C-SSRS RISK CATEGORY: High Risk  Suicide Risk Assessment: Patient has following modifiable risk factors for suicide: active suicidal ideation, untreated depression, under treated depression , social isolation, medication noncompliance, and lack of access to outpatient mental health resources, which we are addressing by inpatient psychiatry. Patient has following non-modifiable or demographic risk factors for suicide: history of suicide attempt Patient has the following protective factors against suicide: Access to  outpatient mental health care, Supportive family, Supportive friends, Cultural, spiritual, or religious beliefs that discourage suicide, Frustration tolerance, and no history of NSSIB  Thank you for this consult request. Recommendations have been communicated to the primary team.  We will continue to follow at this time.   Tammy Amos, FNP       History of Present Illness  Relevant Aspects of [facility] Course:    Patient Report:   Pt reports feeling "embarrassed by my actions", although endorses does continue to have underlying anxiety, depression, and suicidal thoughts at times. Patient describes her depression as waves, "it comes and goes.". She denies homicidal ideations, or violent ideations. She denies auditory visual hallucinations or paranoia. Pt reports she feels she needs inpatient because she is unable to control her depression and this is her (2)suicide attempt and she needs to get on her medications. SHe has some insight into her mental illness and need for stabilization.   She is able to verbally contract to safety for herself/others    Psych ROS:  Depression: YEs Anxiety:  Yes Mania (lifetime and current): Denies Psychosis: (lifetime and current): Denies  Collateral information:  Attempted to reach husband.   Review of Systems  Psychiatric/Behavioral:  Positive for depression, substance abuse and suicidal ideas. The patient is nervous/anxious and has insomnia.  All other systems reviewed and are negative.    Psychiatric and Social History  Psychiatric History:  Information collected from patient and chart review  Prev Dx/Sx: MDD, GAD Current Psych Provider: No Home Meds (current): None Previous Med Trials: Zoloft Therapy: None currently  Prior Psych Hospitalization: Denies history  Prior Self Harm: Denies hx Prior Violence: Denies  Family Psych History: Aunt dx schizophrenia Family Hx suicide: Denies family hx of suicide attempt  Social  History:  Developmental Hx: WNL Educational Hx: HS Occupational Hx: Unemployed Armed forces operational officer Hx: Denies Living Situation: Lives with sister Spiritual Hx: Denies Access to weapons/lethal means: Denies   Substance History Alcohol: Denies daily use, but drinks every so often  Type of alcohol Liquor Last Drink hours before her suicide attempt Number of drinks per day "unable to remember" History of alcohol withdrawal seizures Denies History of DT's Denies Tobacco: Denies Illicit drugs: Denies Prescription drug abuse: Denies Rehab hx: Denies  Exam Findings  Physical Exam: AA female wearing glasses, sitting on the edge of the bed in hospital appropriate clothing.  Vital Signs:  Temp:  [97.8 F (36.6 C)-98.6 F (37 C)] 98.2 F (36.8 C) (04/07 1218) Pulse Rate:  [82-113] 90 (04/07 1218) Resp:  [14-20] 18 (04/07 1218) BP: (128-145)/(73-89) 145/85 (04/07 1218) SpO2:  [97 %-100 %] 99 % (04/07 1218) Blood pressure (!) 145/85, pulse 90, temperature 98.2 F (36.8 C), resp. rate 18, height 5\' 6"  (1.676 m), weight 110.2 kg, SpO2 99%. Body mass index is 39.21 kg/m.  Physical Exam Vitals and nursing note reviewed.  Constitutional:      Appearance: Normal appearance. She is obese.  Neurological:     General: No focal deficit present.     Mental Status: She is alert and oriented to person, place, and time. Mental status is at baseline.  Psychiatric:        Attention and Perception: Attention and perception normal.        Mood and Affect: Mood is anxious and depressed. Affect is flat.        Speech: Speech normal.        Behavior: Behavior normal. Behavior is cooperative.        Thought Content: Thought content includes suicidal ideation.        Cognition and Memory: Cognition and memory normal.        Judgment: Judgment is impulsive.     Mental Status Exam: General Appearance: Fairly Groomed  Orientation:  Full (Time, Place, and Person)  Memory:  Immediate;   Poor Recent;    Good Remote;   Good  Concentration:  Concentration: Fair and Attention Span: Good  Recall:  Good  Attention  Good  Eye Contact:  Good  Speech:  Clear and Coherent and Normal Rate  Language:  Good  Volume:  Normal  Mood: Im embarassed  Affect:  Congruent  Thought Process:  Coherent, Goal Directed, Linear, and Descriptions of Associations: Intact  Thought Content:  WDL  Suicidal Thoughts:  Yes.  with intent/plan  Homicidal Thoughts:  No  Judgement:  Good  Insight:  Good  Psychomotor Activity:  Normal  Akathisia:  No  Fund of Knowledge:  Good      Assets:  Communication Skills Desire for Improvement Financial Resources/Insurance Housing Intimacy Leisure Time Physical Health Resilience Social Support Talents/Skills Vocational/Educational  Cognition:  WNL  ADL's:  Intact  AIMS (if indicated):        Other History   These have been pulled in through the EMR, reviewed,  and updated if appropriate.  Family History:  The patient's family history includes Depression in her mother; Heart disease in an other family member; Hypertension in an other family member; Kidney disease in an other family member; Stroke in an other family member.  Medical History: Past Medical History:  Diagnosis Date   Bilateral ovarian cysts    Depression    Pediatric overweight 07/13/2013   Pre-diabetes     Surgical History: Past Surgical History:  Procedure Laterality Date   EYE MUSCLE SURGERY     in 3rd grade. No reported residual weakness.      Medications:   Current Facility-Administered Medications:    acetaminophen (TYLENOL) tablet 650 mg, 650 mg, Oral, Q6H PRN **OR** acetaminophen (TYLENOL) suppository 650 mg, 650 mg, Rectal, Q6H PRN, Bobette Mo, MD   enoxaparin (LOVENOX) injection 40 mg, 40 mg, Subcutaneous, Q24H, Bobette Mo, MD, 40 mg at 01/31/24 2118   hydrALAZINE (APRESOLINE) tablet 50 mg, 50 mg, Oral, Q6H PRN, Bobette Mo, MD   hydrOXYzine (ATARAX)  tablet 50 mg, 50 mg, Oral, Q6H PRN, Bobette Mo, MD   influenza vac split trivalent PF (FLULAVAL) injection 0.5 mL, 0.5 mL, Intramuscular, Tomorrow-1000, Bobette Mo, MD   insulin aspart (novoLOG) injection 0-15 Units, 0-15 Units, Subcutaneous, TID WC, Rolan Bucco, MD, 11 Units at 02/01/24 1230   insulin glargine-yfgn (SEMGLEE) injection 15 Units, 15 Units, Subcutaneous, Daily, [place], Eric J, DO, 15 Units at 02/01/24 1228   living well with diabetes book MISC, , Does not apply, Once, [place], Eric J, DO   ondansetron (ZOFRAN) tablet 4 mg, 4 mg, Oral, Q6H PRN **OR** ondansetron (ZOFRAN) injection 4 mg, 4 mg, Intravenous, Q6H PRN, Bobette Mo, MD   traZODone (DESYREL) tablet 50 mg, 50 mg, Oral, QHS PRN, Bobette Mo, MD, 50 mg at 02/01/24 1610  Allergies: Allergies  Allergen Reactions   Penicillins Anaphylaxis, Itching and Other (See Comments)    Did it involve swelling of the face/tongue/throat, SOB, or low BP? Yes Did it involve sudden or severe rash/hives, skin peeling, or any reaction on the inside of your mouth or nose? Y Did you need to seek medical attention at a hospital or doctor's office? Y When did it last happen?     Childhood. If all above answers are "NO", may proceed with cephalosporin use.     Tammy Amos, FNP

## 2024-02-02 ENCOUNTER — Other Ambulatory Visit (HOSPITAL_COMMUNITY): Payer: Self-pay

## 2024-02-02 DIAGNOSIS — T1491XA Suicide attempt, initial encounter: Secondary | ICD-10-CM | POA: Diagnosis not present

## 2024-02-02 DIAGNOSIS — E1110 Type 2 diabetes mellitus with ketoacidosis without coma: Secondary | ICD-10-CM | POA: Diagnosis not present

## 2024-02-02 LAB — GLUCOSE, CAPILLARY
Glucose-Capillary: 229 mg/dL — ABNORMAL HIGH (ref 70–99)
Glucose-Capillary: 254 mg/dL — ABNORMAL HIGH (ref 70–99)

## 2024-02-02 LAB — BASIC METABOLIC PANEL WITH GFR
Anion gap: 7 (ref 5–15)
BUN: 10 mg/dL (ref 6–20)
CO2: 22 mmol/L (ref 22–32)
Calcium: 9.2 mg/dL (ref 8.9–10.3)
Chloride: 104 mmol/L (ref 98–111)
Creatinine, Ser: 0.57 mg/dL (ref 0.44–1.00)
GFR, Estimated: >60 mL/min (ref >60)
Glucose, Bld: 262 mg/dL — ABNORMAL HIGH (ref 70–99)
Potassium: 3.6 mmol/L (ref 3.5–5.1)
Sodium: 133 mmol/L — ABNORMAL LOW (ref 135–145)

## 2024-02-02 MED ORDER — SULFAMETHOXAZOLE-TRIMETHOPRIM 800-160 MG PO TABS
1.0000 | ORAL_TABLET | Freq: Two times a day (BID) | ORAL | Status: DC
  Administered 2024-02-02: 1 via ORAL
  Filled 2024-02-02: qty 1

## 2024-02-02 MED ORDER — INSULIN GLARGINE-YFGN 100 UNIT/ML ~~LOC~~ SOLN
25.0000 [IU] | Freq: Every day | SUBCUTANEOUS | Status: DC
  Administered 2024-02-02: 25 [IU] via SUBCUTANEOUS
  Filled 2024-02-02: qty 0.25

## 2024-02-02 MED ORDER — METFORMIN HCL 500 MG PO TABS: 500.0000 mg | ORAL_TABLET | Freq: Two times a day (BID) | ORAL | Status: DC

## 2024-02-02 MED ORDER — INSULIN GLARGINE-YFGN 100 UNIT/ML ~~LOC~~ SOLN: 25.0000 [IU] | Freq: Every day | SUBCUTANEOUS | Status: DC

## 2024-02-02 MED ORDER — INSULIN ASPART 100 UNIT/ML IJ SOLN
8.0000 [IU] | Freq: Three times a day (TID) | INTRAMUSCULAR | Status: DC
  Administered 2024-02-02 (×2): 8 [IU] via SUBCUTANEOUS

## 2024-02-02 MED ORDER — INSULIN ASPART 100 UNIT/ML IJ SOLN: 8.0000 [IU] | Freq: Three times a day (TID) | INTRAMUSCULAR | Status: DC

## 2024-02-02 MED ORDER — BLOOD GLUCOSE METER KIT: PACK | 0 refills | Status: AC

## 2024-02-02 MED ORDER — FREESTYLE LIBRE 3 SENSOR MISC: 3 refills | Status: DC

## 2024-02-02 MED ORDER — SULFAMETHOXAZOLE-TRIMETHOPRIM 800-160 MG PO TABS: 1.0000 | ORAL_TABLET | Freq: Two times a day (BID) | ORAL | Status: AC

## 2024-02-02 MED ORDER — INSULIN ASPART 100 UNIT/ML IJ SOLN: 0.0000 [IU] | Freq: Three times a day (TID) | INTRAMUSCULAR | Status: DC

## 2024-02-02 NOTE — Plan of Care (Signed)

## 2024-02-02 NOTE — Plan of Care (Signed)

## 2024-02-02 NOTE — Telephone Encounter (Signed)
 Pharmacy Patient Advocate Encounter  Received notification from [facility] that Prior Authorization for FreeStyle Libre 3 Sensor  has been APPROVED from 02/02/2024 to 08/03/2024. Ran test claim, Copay is $0.00. This test claim was processed through [facility]- copay amounts may vary at other pharmacies due to pharmacy/plan contracts, or as the patient moves through the different stages of their insurance plan.   PA #/Case ID/Reference #: [id number]

## 2024-02-02 NOTE — Discharge Summary (Addendum)
 Physician Discharge Summary  BASHA KRYGIER AVW:[id number] DOB: 05-16-1996 DOA: 01/31/2024  PCP: Morene Crocker, MD  Admit date: 01/31/2024 Discharge date: 02/02/2024  Admitted From: Home Disposition:  Transfer to [facility] inpatient Psych facility  Recommendations for Outpatient Follow-up:  Follow up with PCP in 1-2 weeks Started on Semglee 25u [place] daily, Novolog 8 units [place] TIDAC, metformin 500mg  PO BID for new diagnosis of type 2 diabetes mellitis.  Continue to monitor glucose 4 times daily and adjust insulin regimen as required Starting on Bactrim for left index finger paronychia When need glucometer on discharge from inpatient psych as well as qualifies for FreeStyle Libre 3 device  Discharge Condition: stable CODE STATUS: Full Code Diet recommendation: Consistent carbohydrate diet  History of present illness:  Tammy Hayes is a 28 y.o. female with past medical history significant for major depressive disorder, previous history of prediabetes, now type 2 diabetes mellitus, obesity who presented to [facility] ED on 01/31/2024 from home after being brought in by police after sister called for concern regarding attempt at suicide.  Patient attempting to commit suicide by cutting her throat which was stopped by her sister.  Also apparently patient stating she wanted to overdose on "pills"; as well as being under the influence of alcohol.   In the ED, temperature 98.2 F, HR 114, RR 18, BP 128/62, SpO2 98% on room air.  WC 6.4, hemoglobin 15.3, platelet count 169.  Sodium 137, potassium 3.6, chloride 106, CO2 14, glucose 407, BUN 7, creatinine 0.74.  Anion gap 17.  AST 22, ALT 28, total bilirubin 0.4.  Urinalysis with greater than 500 glucose, 15 ketones, otherwise unrevealing.  hCG negative.  EtOH level 205.  Salicylate level less than 7.0.  UDS negative.  Psychiatry consulted.  Duration consulted for admission for further evaluation and management of DKA, new diagnosis of diabetes  melitis.  Hospital course:  Diabetic ketoacidosis Type 2 diabetes mellitus Glucose 407, anion gap 17 with 15 ketones and greater than 500 glucose on urinalysis in the ED.  Previous history of prediabetes.  Hemoglobin A1c 12.8. Seen by diabetic educator while inpatient. Semglee titrated to 25 units [place] daily and Novolog 8 units TIDAC. Start Metformin 500 mg PO BID, and can further titrate to 1000mg  PO BID over next 2-3 weeks. Continue to monitor glucose 4 times daily and further titrate insulin regimen as needed. Outpatient f/u with PCP in 1-2 weeks.    Suicide attempt/ideation Major depressive disorder Psychiatry was consulted and followed during the hospital course. Continue one-to-one sitter, suicide precautions. On voluntary commitment. Discharging to inpatient psychiatry at [facility]  Left index finger paronychia Bactrim double strength twice daily x 7 days   EtOH intoxication EtOH level 205 on admission.  Counseled on need for complete cessation.   Obesity, class III Body mass index is 39.21 kg/m.  Discharge Diagnoses:  Principal Problem:   Diabetes mellitus with hyperglycemia (HCC) Active Problems:   Hypertension   Anxiety and depression   Suicide attempt (HCC)   Class 2 obesity   DKA (diabetic ketoacidosis) [facility])    Discharge Instructions  Discharge Instructions     Call MD for:  difficulty breathing, headache or visual disturbances   Complete by: As directed    Call MD for:  extreme fatigue   Complete by: As directed    Call MD for:  persistant dizziness or light-headedness   Complete by: As directed    Call MD for:  persistant nausea and vomiting  Complete by: As directed    Call MD for:  severe uncontrolled pain   Complete by: As directed    Call MD for:  temperature >100.4   Complete by: As directed    Diet - low sodium heart healthy   Complete by: As directed    Increase activity slowly   Complete by: As directed       Allergies as of 02/02/2024        Reactions   Penicillins Anaphylaxis, Itching, Other (See Comments)   Did it involve swelling of the face/tongue/throat, SOB, or low BP? Yes Did it involve sudden or severe rash/hives, skin peeling, or any reaction on the inside of your mouth or nose? Y Did you need to seek medical attention at a hospital or doctor's office? Y When did it last happen?     Childhood. If all above answers are "NO", may proceed with cephalosporin use.        Medication List     TAKE these medications    insulin aspart 100 UNIT/ML injection Commonly known as: novoLOG Inject 0-15 Units into the skin 3 (three) times daily with meals.   insulin aspart 100 UNIT/ML injection Commonly known as: novoLOG Inject 8 Units into the skin 3 (three) times daily with meals.   insulin glargine-yfgn 100 UNIT/ML injection Commonly known as: SEMGLEE Inject 0.25 mLs (25 Units total) into the skin daily. Start taking on: February 03, 2024   metFORMIN 500 MG tablet Commonly known as: GLUCOPHAGE Take 1 tablet (500 mg total) by mouth 2 (two) times daily with a meal.   Nexplanon 68 MG Impl implant Generic drug: etonogestrel 1 each by Subdermal route once.        Follow-up Information     Morene Crocker, MD. Schedule an appointment as soon as possible for a visit in 1 week(s).   Specialty: Internal Medicine Contact information: [address] [place] [address] [phone]                Allergies  Allergen Reactions   Penicillins Anaphylaxis, Itching and Other (See Comments)    Did it involve swelling of the face/tongue/throat, SOB, or low BP? Yes Did it involve sudden or severe rash/hives, skin peeling, or any reaction on the inside of your mouth or nose? Y Did you need to seek medical attention at a hospital or doctor's office? Y When did it last happen?     Childhood. If all above answers are "NO", may proceed with cephalosporin use.     Consultations: Psychiatry     Procedures/Studies: No results found.   Subjective: Patient seen examined bedside, lying in bed.  Sitter present at bedside.  No complaints this morning.  Denies active SI/HI.  No specific complaints or concerns at this time.  Denies headache, no visual changes, no chest pain, no palpitations, no shortness of breath, no abdominal pain, no fever/chills/night sweats, no nausea/vomiting/diarrhea, no focal weakness, no fatigue, no paresthesia.  No acute events overnight per nursing staff. Discharging to inpatient psych facility today.   Discharge Exam: Vitals:   02/01/24 2041 02/02/24 0649  BP: (!) 143/84 128/68  Pulse: 96 82  Resp: 18 18  Temp: 98.2 F (36.8 C) 97.6 F (36.4 C)  SpO2: 98% 100%   Vitals:   02/01/24 0747 02/01/24 1218 02/01/24 2041 02/02/24 0649  BP: (!) 143/81 (!) 145/85 (!) 143/84 128/68  Pulse: 93 90 96 82  Resp: 16 18 18 18   Temp: 98.5 F (36.9  C) 98.2 F (36.8 C) 98.2 F (36.8 C) 97.6 F (36.4 C)  TempSrc: Oral  Oral Oral  SpO2: 97% 99% 98% 100%  Weight:      Height:        Physical Exam: GEN: NAD, alert and oriented x 3, obese HEENT: NCAT, PERRL, EOMI, sclera clear, MMM PULM: CTAB w/o wheezes/crackles, normal respiratory effort, on room air CV: RRR w/o M/G/R GI: abd soft, NTND, NABS, no R/G/M MSK: no peripheral edema, muscle strength globally intact 5/5 bilateral upper/lower extremities NEURO: CN II-XII intact, no focal deficits, sensation to light touch intact PSYCH: normal mood/affect, denies SI/HI Integumentary: dry/intact, no rashes or wounds    The results of significant diagnostics from this hospitalization (including imaging, microbiology, ancillary and laboratory) are listed below for reference.     Microbiology: No results found for this or any previous visit (from the past 240 hours).   Labs: BNP (last 3 results) No results for input(s): "BNP" in the last 8760 hours. Basic Metabolic Panel: Recent Labs  Lab 01/31/24 0448  01/31/24 0700 01/31/24 0851 02/01/24 0403 02/02/24 0424  NA 137 142 140 133* 133*  K 3.6 3.5 3.6 3.7 3.6  CL 106  --  108 105 104  CO2 14*  --  19* 20* 22  GLUCOSE 407*  --  278* 215* 262*  BUN 7  --  7 12 10   CREATININE 0.74  --  0.72 0.67 0.57  CALCIUM 9.0  --  9.0 8.9 9.2   Liver Function Tests: Recent Labs  Lab 01/31/24 0448 02/01/24 0403  AST 22 19  ALT 28 23  ALKPHOS 88 44  BILITOT 0.4 0.9  PROT 8.1 6.3*  ALBUMIN 4.4 3.5   No results for input(s): "LIPASE", "AMYLASE" in the last 168 hours. No results for input(s): "AMMONIA" in the last 168 hours. CBC: Recent Labs  Lab 01/31/24 0448 01/31/24 0700 02/01/24 0403  WBC 6.4  --  5.6  HGB 15.3* 16.0* 13.6  HCT 45.6 47.0* 43.4  MCV 80.0  --  84.3  PLT 169  --  143*   Cardiac Enzymes: No results for input(s): "CKTOTAL", "CKMB", "CKMBINDEX", "TROPONINI" in the last 168 hours. BNP: Invalid input(s): "POCBNP" CBG: Recent Labs  Lab 02/01/24 0744 02/01/24 1215 02/01/24 1657 02/01/24 2131 02/02/24 0833  GLUCAP 227* 344* 260* 296* 229*   D-Dimer No results for input(s): "DDIMER" in the last 72 hours. Hgb A1c Recent Labs    01/31/24 0448  HGBA1C 12.8*   Lipid Profile No results for input(s): "CHOL", "HDL", "LDLCALC", "TRIG", "CHOLHDL", "LDLDIRECT" in the last 72 hours. Thyroid function studies No results for input(s): "TSH", "T4TOTAL", "T3FREE", "THYROIDAB" in the last 72 hours.  Invalid input(s): "FREET3" Anemia work up No results for input(s): "VITAMINB12", "FOLATE", "FERRITIN", "TIBC", "IRON", "RETICCTPCT" in the last 72 hours. Urinalysis    Component Value Date/Time   COLORURINE STRAW (A) 01/31/2024 0448   APPEARANCEUR CLEAR 01/31/2024 0448   APPEARANCEUR Turbid (A) 05/03/2019 1130   LABSPEC 1.010 01/31/2024 0448   PHURINE 5.5 01/31/2024 0448   GLUCOSEU >=500 (A) 01/31/2024 0448   HGBUR NEGATIVE 01/31/2024 0448   BILIRUBINUR NEGATIVE 01/31/2024 0448   BILIRUBINUR Negative 05/03/2019 1130    KETONESUR 15 (A) 01/31/2024 0448   PROTEINUR NEGATIVE 01/31/2024 0448   NITRITE NEGATIVE 01/31/2024 0448   LEUKOCYTESUR NEGATIVE 01/31/2024 0448   Sepsis Labs Recent Labs  Lab 01/31/24 0448 02/01/24 0403  WBC 6.4 5.6   Microbiology No results found for this or any  previous visit (from the past 240 hours).   Time coordinating discharge: Over 30 minutes  SIGNED:   Alvira Philips [place], DO  [facility] 02/02/2024, 11:21 AM

## 2024-02-02 NOTE — TOC Transition Note (Signed)
 Transition of Care [facility]) - Discharge Note   Patient Details  Name: Tammy Hayes MRN: [id number] Date of Birth: [birth year]  Transition of Care [facility]) CM/SW Contact:  Diona Browner, LCSW Phone Number: 02/02/2024, 1:04 PM   Clinical Narrative:    Pt accepted bed off at [facility]. Call report [phone]. Accepting dr is Dr. Sherrian Divers. [facility] called at 11:39am. No further TOC needs.   Final next level of care: Psychiatric Hospital Barriers to Discharge: No Barriers Identified   Patient Goals and CMS Choice Patient states their goals for this hospitalization and ongoing recovery are:: return home following inpatient psych stay CMS Medicare.gov Compare Post Acute Care list provided to:: Other (Comment Required) (NA) Choice offered to / list presented to : NA [place] ownership interest in [facility].provided to::  (NA)    Discharge Placement                Patient to be transferred to facility by: [facility] Name of family member notified: Argentina Ponder (Mother)  [phone] (Mobile) Patient and family notified of of transfer: 02/02/24  Discharge Plan and Services Additional resources added to the After Visit Summary for                  DME Arranged: N/A DME Agency: NA       HH Arranged: NA HH Agency: NA        Social Drivers of Health (SDOH) Interventions SDOH Screenings   Food Insecurity: Food Insecurity Present (01/31/2024)  Housing: Low Risk  (01/31/2024)  Transportation Needs: Unmet Transportation Needs (01/31/2024)  Utilities: Not At Risk (01/31/2024)  Depression (PHQ2-9): High Risk (05/26/2022)  Social Connections: Socially Isolated (01/31/2024)  Tobacco Use: High Risk (01/31/2024)     Readmission Risk Interventions     No data to display

## 2024-02-02 NOTE — Discharge Instructions (Signed)
 Call Diabetes Coordinator Nurse with assistance placing continuous glucose monitor 8a-5p Monday-Friday [phone]

## 2024-02-02 NOTE — Inpatient Diabetes Management (Signed)
 Inpatient Diabetes Program Recommendations  AACE/ADA: New Consensus Statement on Inpatient Glycemic Control (2015)  Target Ranges:  Prepandial:   less than 140 mg/dL      Peak postprandial:   less than 180 mg/dL (1-2 hours)      Critically ill patients:  140 - 180 mg/dL   Lab Results  Component Value Date   GLUCAP 254 (H) 02/02/2024   HGBA1C 12.8 (H) 01/31/2024    Review of Glycemic Control  Diabetes history: Pre DM in the past, New Diabetes 2 this admission  Spoke with pt at bedside again. Answered questions regarding diet. Pt return demonstrated the use and operation of the insulin pen.   Pt $0 copay for freestyle Libre 3 plus. Pt would benefit from this when d/c'd. Pt will check compatibility with her phone apps.  Thanks,  Christena Deem RN, MSN, BC-ADM Inpatient Diabetes Coordinator Team Pager [phone] (8a-5p)

## 2024-02-03 DIAGNOSIS — F332 Major depressive disorder, recurrent severe without psychotic features: Secondary | ICD-10-CM | POA: Diagnosis not present

## 2024-02-04 DIAGNOSIS — F332 Major depressive disorder, recurrent severe without psychotic features: Secondary | ICD-10-CM | POA: Diagnosis not present

## 2024-02-05 DIAGNOSIS — F332 Major depressive disorder, recurrent severe without psychotic features: Secondary | ICD-10-CM | POA: Diagnosis not present

## 2024-02-08 DIAGNOSIS — F332 Major depressive disorder, recurrent severe without psychotic features: Secondary | ICD-10-CM | POA: Diagnosis not present

## 2024-02-09 DIAGNOSIS — F332 Major depressive disorder, recurrent severe without psychotic features: Secondary | ICD-10-CM | POA: Diagnosis not present

## 2024-02-10 DIAGNOSIS — F332 Major depressive disorder, recurrent severe without psychotic features: Secondary | ICD-10-CM | POA: Diagnosis not present

## 2024-02-16 ENCOUNTER — Telehealth: Payer: Self-pay | Admitting: Medical

## 2024-02-16 ENCOUNTER — Ambulatory Visit (INDEPENDENT_AMBULATORY_CARE_PROVIDER_SITE_OTHER): Payer: MEDICAID | Admitting: Medical

## 2024-02-16 ENCOUNTER — Encounter: Payer: Self-pay | Admitting: Medical

## 2024-02-16 ENCOUNTER — Encounter (INDEPENDENT_AMBULATORY_CARE_PROVIDER_SITE_OTHER): Payer: Self-pay

## 2024-02-16 VITALS — BP 144/82 | HR 100 | Temp 98.0°F | Resp 18 | Ht 66.5 in | Wt 250.0 lb

## 2024-02-16 DIAGNOSIS — E1165 Type 2 diabetes mellitus with hyperglycemia: Secondary | ICD-10-CM | POA: Diagnosis not present

## 2024-02-16 DIAGNOSIS — F419 Anxiety disorder, unspecified: Secondary | ICD-10-CM | POA: Diagnosis not present

## 2024-02-16 DIAGNOSIS — F32A Depression, unspecified: Secondary | ICD-10-CM

## 2024-02-16 DIAGNOSIS — Z794 Long term (current) use of insulin: Secondary | ICD-10-CM

## 2024-02-16 MED ORDER — FREESTYLE LIBRE 3 READER DEVI: 0 refills | Status: AC

## 2024-02-16 MED ORDER — PEN NEEDLES 32G X 5 MM MISC
0 refills | Status: AC
Start: 2024-02-16 — End: ?

## 2024-02-16 MED ORDER — FLUOXETINE HCL 40 MG PO CAPS: 40.0000 mg | ORAL_CAPSULE | Freq: Every day | ORAL | 0 refills | Status: DC

## 2024-02-16 NOTE — Telephone Encounter (Signed)
 Reader and pen needles sent into pharmacy.

## 2024-02-16 NOTE — Patient Instructions (Signed)
 Type 2 diabetes mellitus with hyperglycemia New onset type 2 diabetes mellitus with A1c of 12.8, indicating significant hyperglycemia. Currently on Lantus  10 units daily. Requires CGM supplies and reader; potential need for prior authorization.  - Refer to diabetic education for dietary management and education. - continue Lantus  10 units daily. - Assist with obtaining CGM supplies and reader, anticipate potential need for prior authorization. - Advise to check blood glucose twice daily, in the morning and before sleep. - Instruct to withhold insulin  if blood glucose is less than 180 mg/dL at night before sleep or 130 mg/dL or less in the morning. - Advise to report specified low blood glucose readings. - Schedule follow-up in one week to review blood glucose readings and management. -continue metformin .  Major depressive disorder, recurrent, moderate Recurrent moderate major depressive disorder with three suicide attempts, most recent being recent. PHQ-9 score is 11, GAD-7 score is 13, indicating moderate anxiety. Currently on fluoxetine  40 mg daily, providing partial relief. Recent life stressors include moving out of her mother's house, contributing to mood instability. Fluoxetine  chosen for dual efficacy in anxiety and depression. Emergency care advised if suicidal thoughts or plans occur. - Continue fluoxetine  40 mg daily. - Advise to contact emergency services if suicidal thoughts or plans occur. - Refer to [facility] for psychiatry and counseling, follow up on referral status in 2-3 days. - Advise to provide feedback on referral response.  Follow up in one week or sooner if needed
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
## 2024-02-16 NOTE — Telephone Encounter (Signed)
 New pt who has diabetes. Hospital gave rx freestyle libre 3 sensor. She was told to check sugars 4 times a day. No rx for reader or associated supplies with Lantus . 10 units at night. Pt needs needles for pen, reader and associated supplies. Will you help me on this new. She is new pt and numerous med problems. If you load will sign. Expect will need prior auth

## 2024-02-18 DIAGNOSIS — R45851 Suicidal ideations: Secondary | ICD-10-CM | POA: Diagnosis not present

## 2024-02-18 DIAGNOSIS — F332 Major depressive disorder, recurrent severe without psychotic features: Secondary | ICD-10-CM | POA: Diagnosis not present

## 2024-02-19 DIAGNOSIS — R45851 Suicidal ideations: Secondary | ICD-10-CM | POA: Diagnosis not present

## 2024-02-20 DIAGNOSIS — R45851 Suicidal ideations: Secondary | ICD-10-CM | POA: Diagnosis not present

## 2024-02-21 DIAGNOSIS — R45851 Suicidal ideations: Secondary | ICD-10-CM | POA: Diagnosis not present

## 2024-02-22 DIAGNOSIS — F332 Major depressive disorder, recurrent severe without psychotic features: Secondary | ICD-10-CM | POA: Diagnosis not present

## 2024-02-23 ENCOUNTER — Ambulatory Visit (INDEPENDENT_AMBULATORY_CARE_PROVIDER_SITE_OTHER): Payer: MEDICAID | Admitting: Medical

## 2024-02-23 ENCOUNTER — Encounter: Payer: Self-pay | Admitting: Medical

## 2024-02-23 ENCOUNTER — Telehealth: Payer: Self-pay

## 2024-02-23 VITALS — BP 126/60 | HR 104 | Temp 98.2°F | Resp 12 | Ht 66.5 in | Wt 252.8 lb

## 2024-02-23 DIAGNOSIS — R319 Hematuria, unspecified: Secondary | ICD-10-CM

## 2024-02-23 DIAGNOSIS — Z794 Long term (current) use of insulin: Secondary | ICD-10-CM | POA: Diagnosis not present

## 2024-02-23 DIAGNOSIS — F17200 Nicotine dependence, unspecified, uncomplicated: Secondary | ICD-10-CM

## 2024-02-23 DIAGNOSIS — F419 Anxiety disorder, unspecified: Secondary | ICD-10-CM

## 2024-02-23 DIAGNOSIS — I10 Essential (primary) hypertension: Secondary | ICD-10-CM | POA: Diagnosis not present

## 2024-02-23 DIAGNOSIS — E1165 Type 2 diabetes mellitus with hyperglycemia: Secondary | ICD-10-CM

## 2024-02-23 DIAGNOSIS — F32A Depression, unspecified: Secondary | ICD-10-CM

## 2024-02-23 DIAGNOSIS — F332 Major depressive disorder, recurrent severe without psychotic features: Secondary | ICD-10-CM | POA: Diagnosis not present

## 2024-02-23 LAB — COMPREHENSIVE METABOLIC PANEL WITH GFR
ALT: 29 U/L (ref 0–35)
AST: 22 U/L (ref 0–37)
Albumin: 4.6 g/dL (ref 3.5–5.2)
Alkaline Phosphatase: 67 U/L (ref 39–117)
BUN: 12 mg/dL (ref 6–23)
CO2: 30 meq/L (ref 19–32)
Calcium: 9.9 mg/dL (ref 8.4–10.5)
Chloride: 100 meq/L (ref 96–112)
Creatinine, Ser: 0.81 mg/dL (ref 0.40–1.20)
GFR: 100.59 mL/min (ref >60.00)
Glucose, Bld: 151 mg/dL — ABNORMAL HIGH (ref 70–99)
Potassium: 4.3 meq/L (ref 3.5–5.1)
Sodium: 135 meq/L (ref 135–145)
Total Bilirubin: 0.4 mg/dL (ref 0.2–1.2)
Total Protein: 7.3 g/dL (ref 6.0–8.3)

## 2024-02-23 LAB — URINALYSIS, ROUTINE W REFLEX MICROSCOPIC
Bilirubin Urine: NEGATIVE
Hgb urine dipstick: NEGATIVE
Ketones, ur: NEGATIVE
Nitrite: NEGATIVE
Specific Gravity, Urine: 1.01 (ref 1.000–1.030)
Total Protein, Urine: NEGATIVE
Urine Glucose: 100 — AB
Urobilinogen, UA: 0.2 (ref 0.0–1.0)
pH: 7 (ref 5.0–8.0)

## 2024-02-23 NOTE — Telephone Encounter (Signed)
 Pharmacy Patient Advocate Encounter  Received notification from [facility] that Prior Authorization for FreeStyle Libre 3 Sensor  has been APPROVED from 02/02/2024 to 08/03/2024. Ran test claim, Copay is $0.00. This test claim was processed through [facility]- copay amounts may vary at other pharmacies due to pharmacy/plan contracts, or as the patient moves through the different stages of their insurance plan.   PA #/Case ID/Reference #: [id number]
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
## 2024-02-23 NOTE — Telephone Encounter (Signed)
 Copied from CRM [id number]. Topic: Clinical - Medication Question >> Feb 23, 2024 12:50 PM Allyne Areola wrote: Reason for CRM: Patient's aunt Tammy Hayes is calling because the pharmacy informed her that the Continuous Glucose Receiver (FREESTYLE LIBRE 3 READER) DEVI  requires a prior authorization.

## 2024-02-23 NOTE — Patient Instructions (Addendum)
 Depression and anxiety presently stable.  Patient is on Lexapro.  Patient is going to the behavioral health program for counseling and psychiatric care.  Advised to attend that program.  First appointment is 2 days from now.  If during the interim any thoughts of harm to self or others then be seen in the emergency department again.  Type 2 diabetes.  Continue with Lantus  10 units at night and metformin  500 mg twice daily. -Please go ahead and call diabetic education office to get scheduled.  Hypertension-BP well-controlled today.  Continue lisinopril 40 mg daily. -Checking CMP today. -rx bp cuff given to pt.  Advised smoking cessation and use Nicorette gum.  If using Nicorette gum not to smoke.  Start to try to taper off of cigarettes.  For hematuria found in the hospital on urinalysis and repeating urine microscopy today and getting urine culture.  Follow-up in approximately 2 weeks or sooner if needed

## 2024-02-24 DIAGNOSIS — F332 Major depressive disorder, recurrent severe without psychotic features: Secondary | ICD-10-CM | POA: Diagnosis not present

## 2024-02-24 DIAGNOSIS — F339 Major depressive disorder, recurrent, unspecified: Secondary | ICD-10-CM | POA: Diagnosis not present

## 2024-02-24 LAB — URINE CULTURE
MICRO NUMBER:: 16388991
Result:: NO GROWTH
SPECIMEN QUALITY:: ADEQUATE

## 2024-02-25 DIAGNOSIS — F331 Major depressive disorder, recurrent, moderate: Secondary | ICD-10-CM | POA: Diagnosis not present

## 2024-03-02 DIAGNOSIS — F331 Major depressive disorder, recurrent, moderate: Secondary | ICD-10-CM | POA: Diagnosis not present

## 2024-03-02 DIAGNOSIS — F332 Major depressive disorder, recurrent severe without psychotic features: Secondary | ICD-10-CM | POA: Diagnosis not present

## 2024-03-02 MED ORDER — METFORMIN HCL 500 MG PO TABS: 500.0000 mg | ORAL_TABLET | Freq: Two times a day (BID) | ORAL | 3 refills | Status: DC

## 2024-03-02 NOTE — Addendum Note (Signed)
 Addended by: Serafina Damme on: 03/02/2024 06:12 AM   Modules accepted: Orders

## 2024-03-04 DIAGNOSIS — F332 Major depressive disorder, recurrent severe without psychotic features: Secondary | ICD-10-CM | POA: Diagnosis not present

## 2024-03-05 DIAGNOSIS — F332 Major depressive disorder, recurrent severe without psychotic features: Secondary | ICD-10-CM | POA: Diagnosis not present

## 2024-03-06 DIAGNOSIS — F332 Major depressive disorder, recurrent severe without psychotic features: Secondary | ICD-10-CM | POA: Diagnosis not present

## 2024-03-08 ENCOUNTER — Emergency Department (HOSPITAL_COMMUNITY)
Admission: EM | Admit: 2024-03-08 | Discharge: 2024-03-09 | Disposition: A | Discharge status: Other Acute Inpatient Hospital | Payer: MEDICAID | Attending: Emergency Medicine | Admitting: Emergency Medicine

## 2024-03-08 ENCOUNTER — Other Ambulatory Visit: Payer: Self-pay

## 2024-03-08 DIAGNOSIS — F1010 Alcohol abuse, uncomplicated: Secondary | ICD-10-CM | POA: Diagnosis not present

## 2024-03-08 DIAGNOSIS — Z7984 Long term (current) use of oral hypoglycemic drugs: Secondary | ICD-10-CM | POA: Insufficient documentation

## 2024-03-08 DIAGNOSIS — I10 Essential (primary) hypertension: Secondary | ICD-10-CM | POA: Insufficient documentation

## 2024-03-08 DIAGNOSIS — F329 Major depressive disorder, single episode, unspecified: Secondary | ICD-10-CM | POA: Insufficient documentation

## 2024-03-08 DIAGNOSIS — Z794 Long term (current) use of insulin: Secondary | ICD-10-CM | POA: Diagnosis not present

## 2024-03-08 DIAGNOSIS — R000 Tachycardia, unspecified: Secondary | ICD-10-CM | POA: Diagnosis not present

## 2024-03-08 DIAGNOSIS — Z79899 Other long term (current) drug therapy: Secondary | ICD-10-CM | POA: Diagnosis not present

## 2024-03-08 DIAGNOSIS — E1165 Type 2 diabetes mellitus with hyperglycemia: Secondary | ICD-10-CM | POA: Insufficient documentation

## 2024-03-08 DIAGNOSIS — T43222A Poisoning by selective serotonin reuptake inhibitors, intentional self-harm, initial encounter: Secondary | ICD-10-CM | POA: Diagnosis present

## 2024-03-08 DIAGNOSIS — T50904A Poisoning by unspecified drugs, medicaments and biological substances, undetermined, initial encounter: Secondary | ICD-10-CM

## 2024-03-08 DIAGNOSIS — Y906 Blood alcohol level of 120-199 mg/100 ml: Secondary | ICD-10-CM | POA: Diagnosis not present

## 2024-03-08 LAB — HCG, SERUM, QUALITATIVE: Preg, Serum: NEGATIVE

## 2024-03-08 LAB — CBC WITH DIFFERENTIAL/PLATELET
Abs Immature Granulocytes: 0.02 10*3/uL (ref 0.00–0.07)
Basophils Absolute: 0 10*3/uL (ref 0.0–0.1)
Basophils Relative: 1 %
Eosinophils Absolute: 0 10*3/uL (ref 0.0–0.5)
Eosinophils Relative: 1 %
HCT: 42.7 % (ref 36.0–46.0)
Hemoglobin: 13.5 g/dL (ref 12.0–15.0)
Immature Granulocytes: 0 %
Lymphocytes Relative: 36 %
Lymphs Abs: 2.6 10*3/uL (ref 0.7–4.0)
MCH: 26.7 pg (ref 26.0–34.0)
MCHC: 31.6 g/dL (ref 30.0–36.0)
MCV: 84.4 fL (ref 80.0–100.0)
Monocytes Absolute: 0.4 10*3/uL (ref 0.1–1.0)
Monocytes Relative: 6 %
Neutro Abs: 4.2 10*3/uL (ref 1.7–7.7)
Neutrophils Relative %: 56 %
Platelets: 187 10*3/uL (ref 150–400)
RBC: 5.06 MIL/uL (ref 3.87–5.11)
RDW: 14 % (ref 11.5–15.5)
WBC: 7.3 10*3/uL (ref 4.0–10.5)
nRBC: 0 % (ref 0.0–0.2)

## 2024-03-08 LAB — COMPREHENSIVE METABOLIC PANEL WITH GFR
ALT: 32 U/L (ref 0–44)
AST: 25 U/L (ref 15–41)
Albumin: 4 g/dL (ref 3.5–5.0)
Alkaline Phosphatase: 46 U/L (ref 38–126)
Anion gap: 10 (ref 5–15)
BUN: 8 mg/dL (ref 6–20)
CO2: 22 mmol/L (ref 22–32)
Calcium: 8.8 mg/dL — ABNORMAL LOW (ref 8.9–10.3)
Chloride: 108 mmol/L (ref 98–111)
Creatinine, Ser: 0.59 mg/dL (ref 0.44–1.00)
GFR, Estimated: >60 mL/min (ref >60)
Glucose, Bld: 129 mg/dL — ABNORMAL HIGH (ref 70–99)
Potassium: 3.9 mmol/L (ref 3.5–5.1)
Sodium: 140 mmol/L (ref 135–145)
Total Bilirubin: 0.6 mg/dL (ref 0.0–1.2)
Total Protein: 7.2 g/dL (ref 6.5–8.1)

## 2024-03-08 LAB — ACETAMINOPHEN LEVEL: Acetaminophen (Tylenol), Serum: <10 ug/mL — ABNORMAL LOW (ref 10–30)

## 2024-03-08 LAB — MAGNESIUM: Magnesium: 1.9 mg/dL (ref 1.7–2.4)

## 2024-03-08 LAB — RAPID URINE DRUG SCREEN, HOSP PERFORMED
Amphetamines: NOT DETECTED
Barbiturates: NOT DETECTED
Benzodiazepines: NOT DETECTED
Cocaine: NOT DETECTED
Opiates: NOT DETECTED
Tetrahydrocannabinol: NOT DETECTED

## 2024-03-08 LAB — ETHANOL: Alcohol, Ethyl (B): 151 mg/dL — ABNORMAL HIGH (ref <15)

## 2024-03-08 LAB — SALICYLATE LEVEL: Salicylate Lvl: <7 mg/dL — ABNORMAL LOW (ref 7.0–30.0)

## 2024-03-08 MED ORDER — POTASSIUM CHLORIDE CRYS ER 20 MEQ PO TBCR
40.0000 meq | EXTENDED_RELEASE_TABLET | Freq: Once | ORAL | Status: AC
  Administered 2024-03-08: 40 meq via ORAL
  Filled 2024-03-08: qty 2

## 2024-03-08 MED ORDER — LACTATED RINGERS IV BOLUS
1000.0000 mL | Freq: Once | INTRAVENOUS | Status: AC
Start: 2024-03-08 — End: 2024-03-08
  Administered 2024-03-08: 1000 mL via INTRAVENOUS

## 2024-03-08 NOTE — ED Triage Notes (Signed)
 Pt came in via EMS, for taking 15 tablets of 10mg  of ecitalopram and drink alcohol. Pt stated having suicidal ideation. Pt receive zofran  and 1L of NaCl BP 122/82, HR 120, 97% RA cbg 143

## 2024-03-08 NOTE — ED Provider Notes (Signed)
 Care assumed at 2330  Patient has been observed for 8 hours without any signs of toxicity from overdose.  She has been medically cleared for psychiatric evaluation and treatment.   Kelsey Patricia, MD 03/09/24 [phone]

## 2024-03-08 NOTE — ED Provider Notes (Signed)
 [place] EMERGENCY DEPARTMENT AT [facility] Provider Note   CSN: [id number] Arrival date & time: 03/08/24  2020     History  Chief Complaint  Patient presents with   Drug Overdose    Tammy Hayes is a 28 y.o. female.   Drug Overdose     Patient has a history of prediabetes, obesity, depression.  She was recently admitted to the hospital for suicidal ideations.  Patient was most recently admitted to the hospital on May 7 at [facility].  Patient denies feeling suicidal she states she was just trying to sleep.  She started to feel funny so she told her parents and they called the ambulance.  Patient states she took 15 of her 10 mg escitalopram tablets.  She denies any other medications but did report drinking some alcohol.  Patient states she does not want to be at behavioral health hospital again.  Home Medications Prior to Admission medications   Medication Sig Start Date End Date Taking? Authorizing Provider  blood glucose meter kit and supplies Dispense based on patient and insurance preference. Use up to four times daily as directed. (FOR ICD-10 E10.9, E11.9). 02/02/24   [place], Eric J, DO  Continuous Glucose Receiver (FREESTYLE LIBRE 3 READER) DEVI USE AS DIRECTED 02/16/24   Saguier, Gaylin Ke, PA-C  Continuous Glucose Sensor (FREESTYLE LIBRE 3 SENSOR) MISC Place 1 sensor on the skin every 14 days. Use to check glucose continuously 02/02/24   [place], Rema Care, DO  escitalopram (LEXAPRO) 10 MG tablet Take 1 tablet by mouth daily. 02/23/24   [provider]  etonogestrel (NEXPLANON) 68 MG IMPL implant 1 each by Subdermal route once.    [provider]  Insulin  Pen Needle (PEN NEEDLES) 32G X 5 MM MISC USE ONCE DAILY WITH LANTUS  PEN 02/16/24   Saguier, Gaylin Ke, PA-C  LANTUS  SOLOSTAR 100 UNIT/ML Solostar Pen Inject 10 Units into the skin at bedtime. 02/12/24   [provider]  lisinopril (ZESTRIL) 20 MG tablet Take 1 tablet by mouth daily.  02/23/24   [provider]  metFORMIN  (GLUCOPHAGE ) 500 MG tablet Take 1 tablet (500 mg total) by mouth 2 (two) times daily with a meal. 02/02/24   [place], Rema Care, DO  metFORMIN  (GLUCOPHAGE ) 500 MG tablet Take 1 tablet (500 mg total) by mouth 2 (two) times daily with a meal. 03/02/24   Saguier, Gaylin Ke, PA-C  nicotine polacrilex (NICORETTE) 2 MG gum Use as directed 2 mg in the mouth or throat as needed. 02/23/24   [provider]  Vitamin D, Ergocalciferol, (DRISDOL) 1.25 MG (50000 UNIT) CAPS capsule Take 50,000 Units by mouth once a week. 02/11/24   [provider]      Allergies    Penicillins    Review of Systems   Review of Systems  Physical Exam Updated Vital Signs BP (!) 152/84 (BP Location: Right Arm)   Pulse (!) 105   Temp 97.9 F (36.6 C) (Oral)   Resp 11   Ht 1.676 m (5\' 6" )   Wt 114.7 kg   LMP 02/15/2024   SpO2 100%   BMI 40.80 kg/m  Physical Exam Vitals and nursing note reviewed.  Constitutional:      General: She is not in acute distress.    Appearance: She is well-developed.  HENT:     Head: Normocephalic and atraumatic.     Right Ear: External ear normal.     Left Ear: External ear normal.  Eyes:  General: No scleral icterus.       Right eye: No discharge.        Left eye: No discharge.     Conjunctiva/sclera: Conjunctivae normal.  Neck:     Trachea: No tracheal deviation.  Cardiovascular:     Rate and Rhythm: Normal rate and regular rhythm.  Pulmonary:     Effort: Pulmonary effort is normal. No respiratory distress.     Breath sounds: Normal breath sounds. No stridor. No wheezing or rales.  Abdominal:     General: Bowel sounds are normal. There is no distension.     Palpations: Abdomen is soft.     Tenderness: There is no abdominal tenderness. There is no guarding or rebound.  Musculoskeletal:        General: No tenderness or deformity.     Cervical back: Neck supple.  Skin:    General: Skin is warm and dry.     Findings:  No rash.  Neurological:     General: No focal deficit present.     Mental Status: She is alert.     Cranial Nerves: No cranial nerve deficit, dysarthria or facial asymmetry.     Sensory: No sensory deficit.     Motor: No abnormal muscle tone or seizure activity.     Coordination: Coordination normal.  Psychiatric:        Mood and Affect: Mood is depressed. Affect is flat.     ED Results / Procedures / Treatments   Labs (all labs ordered are listed, but only abnormal results are displayed) Labs Reviewed  COMPREHENSIVE METABOLIC PANEL WITH GFR - Abnormal; Notable for the following components:      Result Value   Glucose, Bld 129 (*)    Calcium 8.8 (*)    All other components within normal limits  SALICYLATE LEVEL - Abnormal; Notable for the following components:   Salicylate Lvl <7.0 (*)    All other components within normal limits  ACETAMINOPHEN  LEVEL - Abnormal; Notable for the following components:   Acetaminophen  (Tylenol ), Serum <10 (*)    All other components within normal limits  ETHANOL - Abnormal; Notable for the following components:   Alcohol, Ethyl (B) 151 (*)    All other components within normal limits  RAPID URINE DRUG SCREEN, HOSP PERFORMED  CBC WITH DIFFERENTIAL/PLATELET  HCG, SERUM, QUALITATIVE  MAGNESIUM     EKG EKG Interpretation Date/Time:  Tuesday Mar 08 2024 20:33:00 EDT Ventricular Rate:  110 PR Interval:  181 QRS Duration:  89 QT Interval:  341 QTC Calculation: 462 R Axis:   50  Text Interpretation: Sinus tachycardia Since last tracing rate faster Confirmed by Trish Furl [phone]) on 03/08/2024 10:32:32 PM  Radiology No results found.  Procedures Procedures    Medications Ordered in ED Medications  lactated ringers  bolus 1,000 mL (0 mLs Intravenous Stopped 03/08/24 2237)  potassium chloride  SA (KLOR-CON  M) CR tablet 40 mEq (40 mEq Oral Given 03/08/24 2246)    ED Course/ Medical Decision Making/ A&P Clinical Course as of 03/08/24 2311   Tue Mar 08, 2024  2229 Alcohol level elevated at 151.  Salicylate and acetaminophen  level normal.  CBC normal.  UDS negative.  Pregnancy test negative [JK]  2232 Poison control recommends 8 hours of monitoring.  Would clear at 2 am [JK]    Clinical Course User Index [JK] Trish Furl, MD  Medical Decision Making Problems Addressed: Overdose of undetermined intent, initial encounter: acute illness or injury that poses a threat to life or bodily functions  Amount and/or Complexity of Data Reviewed Labs: ordered.  Risk Prescription drug management.   Patient presented to the ER for evaluation of an overdose.  Patient does have history of same.  Case was discussed with poison control.  They recommend monitoring until 2 AM.  Patient will be medically cleared at that time.  Currently patient appears medically stable.  Will continue to monitor.  Will request psychiatric evaluation  The patient has been placed in psychiatric observation due to the need to provide a safe environment for the patient while obtaining psychiatric consultation and evaluation, as well as ongoing medical and medication management to treat the patient's condition.         Final Clinical Impression(s) / ED Diagnoses Final diagnoses:  Overdose of undetermined intent, initial encounter    Rx / DC Orders ED Discharge Orders     None         Trish Furl, MD 03/08/24 2348

## 2024-03-08 NOTE — ED Notes (Addendum)
 Pt belongings are: med bottle, clothes, shoes, cell phone and a vape. Belonging are in cabinet 16-18 [address]

## 2024-03-08 NOTE — ED Notes (Signed)
 Pt does not want family to be aware of her drinking

## 2024-03-09 ENCOUNTER — Encounter (HOSPITAL_COMMUNITY): Payer: Self-pay | Admitting: Psychiatry

## 2024-03-09 ENCOUNTER — Inpatient Hospital Stay (HOSPITAL_COMMUNITY)
Admission: AD | Admit: 2024-03-09 | Discharge: 2024-03-15 | DRG: 885 | Disposition: A | Discharge status: Home / Self Care | Payer: MEDICAID | Source: Intra-hospital | Attending: Psychiatry | Admitting: Psychiatry

## 2024-03-09 DIAGNOSIS — F17290 Nicotine dependence, other tobacco product, uncomplicated: Secondary | ICD-10-CM | POA: Diagnosis present

## 2024-03-09 DIAGNOSIS — F1090 Alcohol use, unspecified, uncomplicated: Secondary | ICD-10-CM | POA: Diagnosis present

## 2024-03-09 DIAGNOSIS — I10 Essential (primary) hypertension: Secondary | ICD-10-CM | POA: Diagnosis present

## 2024-03-09 DIAGNOSIS — Z823 Family history of stroke: Secondary | ICD-10-CM

## 2024-03-09 DIAGNOSIS — Z79899 Other long term (current) drug therapy: Secondary | ICD-10-CM

## 2024-03-09 DIAGNOSIS — Z794 Long term (current) use of insulin: Secondary | ICD-10-CM | POA: Diagnosis not present

## 2024-03-09 DIAGNOSIS — G4700 Insomnia, unspecified: Secondary | ICD-10-CM | POA: Diagnosis present

## 2024-03-09 DIAGNOSIS — E119 Type 2 diabetes mellitus without complications: Secondary | ICD-10-CM | POA: Diagnosis present

## 2024-03-09 DIAGNOSIS — F332 Major depressive disorder, recurrent severe without psychotic features: Secondary | ICD-10-CM | POA: Diagnosis not present

## 2024-03-09 DIAGNOSIS — T43222A Poisoning by selective serotonin reuptake inhibitors, intentional self-harm, initial encounter: Secondary | ICD-10-CM | POA: Diagnosis not present

## 2024-03-09 DIAGNOSIS — F603 Borderline personality disorder: Secondary | ICD-10-CM | POA: Diagnosis present

## 2024-03-09 DIAGNOSIS — F329 Major depressive disorder, single episode, unspecified: Principal | ICD-10-CM | POA: Diagnosis present

## 2024-03-09 DIAGNOSIS — Z8249 Family history of ischemic heart disease and other diseases of the circulatory system: Secondary | ICD-10-CM | POA: Diagnosis not present

## 2024-03-09 DIAGNOSIS — Z5941 Food insecurity: Secondary | ICD-10-CM | POA: Diagnosis not present

## 2024-03-09 DIAGNOSIS — Z5982 Transportation insecurity: Secondary | ICD-10-CM

## 2024-03-09 DIAGNOSIS — Z818 Family history of other mental and behavioral disorders: Secondary | ICD-10-CM

## 2024-03-09 DIAGNOSIS — Z9151 Personal history of suicidal behavior: Secondary | ICD-10-CM | POA: Diagnosis not present

## 2024-03-09 DIAGNOSIS — Z7984 Long term (current) use of oral hypoglycemic drugs: Secondary | ICD-10-CM

## 2024-03-09 DIAGNOSIS — F411 Generalized anxiety disorder: Secondary | ICD-10-CM | POA: Diagnosis present

## 2024-03-09 LAB — URINALYSIS, ROUTINE W REFLEX MICROSCOPIC
Bilirubin Urine: NEGATIVE
Glucose, UA: NEGATIVE mg/dL
Hgb urine dipstick: NEGATIVE
Ketones, ur: NEGATIVE mg/dL
Leukocytes,Ua: NEGATIVE
Nitrite: NEGATIVE
Protein, ur: NEGATIVE mg/dL
Specific Gravity, Urine: 1.003 — ABNORMAL LOW (ref 1.005–1.030)
pH: 6 (ref 5.0–8.0)

## 2024-03-09 LAB — GLUCOSE, CAPILLARY: Glucose-Capillary: 154 mg/dL — ABNORMAL HIGH (ref 70–99)

## 2024-03-09 LAB — CBG MONITORING, ED: Glucose-Capillary: 109 mg/dL — ABNORMAL HIGH (ref 70–99)

## 2024-03-09 MED ORDER — IBUPROFEN 200 MG PO TABS: 600.0000 mg | ORAL_TABLET | Freq: Three times a day (TID) | ORAL | Status: DC | PRN

## 2024-03-09 MED ORDER — HALOPERIDOL 5 MG PO TABS: 5.0000 mg | ORAL_TABLET | Freq: Three times a day (TID) | ORAL | Status: DC | PRN

## 2024-03-09 MED ORDER — LORAZEPAM 2 MG/ML IJ SOLN: 2.0000 mg | Freq: Three times a day (TID) | INTRAMUSCULAR | Status: DC | PRN

## 2024-03-09 MED ORDER — ONDANSETRON HCL 4 MG PO TABS: 4.0000 mg | ORAL_TABLET | Freq: Three times a day (TID) | ORAL | Status: DC | PRN

## 2024-03-09 MED ORDER — FLUOXETINE HCL 20 MG PO CAPS
40.0000 mg | ORAL_CAPSULE | Freq: Every day | ORAL | Status: DC
  Administered 2024-03-10: 40 mg via ORAL
  Filled 2024-03-09 (×3): qty 2

## 2024-03-09 MED ORDER — HALOPERIDOL LACTATE 5 MG/ML IJ SOLN: 5.0000 mg | Freq: Three times a day (TID) | INTRAMUSCULAR | Status: DC | PRN

## 2024-03-09 MED ORDER — METFORMIN HCL 500 MG PO TABS
1000.0000 mg | ORAL_TABLET | Freq: Two times a day (BID) | ORAL | Status: DC
  Administered 2024-03-09 – 2024-03-15 (×12): 1000 mg via ORAL
  Filled 2024-03-09 (×16): qty 2

## 2024-03-09 MED ORDER — IBUPROFEN 600 MG PO TABS
600.0000 mg | ORAL_TABLET | Freq: Three times a day (TID) | ORAL | Status: DC | PRN
  Administered 2024-03-13: 600 mg via ORAL
  Filled 2024-03-09: qty 1

## 2024-03-09 MED ORDER — INSULIN GLARGINE-YFGN 100 UNIT/ML ~~LOC~~ SOLN
10.0000 [IU] | Freq: Every day | SUBCUTANEOUS | Status: DC
  Administered 2024-03-09 – 2024-03-14 (×6): 10 [IU] via SUBCUTANEOUS

## 2024-03-09 MED ORDER — ESCITALOPRAM OXALATE 10 MG PO TABS
10.0000 mg | ORAL_TABLET | Freq: Every day | ORAL | Status: DC
  Administered 2024-03-09: 10 mg via ORAL
  Filled 2024-03-09: qty 1

## 2024-03-09 MED ORDER — FLUOXETINE HCL 20 MG PO CAPS
40.0000 mg | ORAL_CAPSULE | Freq: Every day | ORAL | Status: DC
  Administered 2024-03-09: 40 mg via ORAL
  Filled 2024-03-09: qty 2

## 2024-03-09 MED ORDER — DIPHENHYDRAMINE HCL 25 MG PO CAPS: 50.0000 mg | ORAL_CAPSULE | Freq: Three times a day (TID) | ORAL | Status: DC | PRN

## 2024-03-09 MED ORDER — ALUM & MAG HYDROXIDE-SIMETH 200-200-20 MG/5ML PO SUSP: 30.0000 mL | ORAL | Status: DC | PRN

## 2024-03-09 MED ORDER — LISINOPRIL 20 MG PO TABS
20.0000 mg | ORAL_TABLET | Freq: Every day | ORAL | Status: DC
  Administered 2024-03-09: 20 mg via ORAL
  Filled 2024-03-09: qty 1

## 2024-03-09 MED ORDER — ALUM & MAG HYDROXIDE-SIMETH 200-200-20 MG/5ML PO SUSP: 30.0000 mL | Freq: Four times a day (QID) | ORAL | Status: DC | PRN

## 2024-03-09 MED ORDER — MAGNESIUM HYDROXIDE 400 MG/5ML PO SUSP
30.0000 mL | Freq: Every day | ORAL | Status: DC | PRN
  Administered 2024-03-12: 30 mL via ORAL
  Filled 2024-03-09: qty 30

## 2024-03-09 MED ORDER — DIPHENHYDRAMINE HCL 50 MG/ML IJ SOLN: 50.0000 mg | Freq: Three times a day (TID) | INTRAMUSCULAR | Status: DC | PRN

## 2024-03-09 MED ORDER — LIDOCAINE 5 % EX PTCH
1.0000 | MEDICATED_PATCH | CUTANEOUS | Status: DC
  Administered 2024-03-09: 1 via TRANSDERMAL
  Filled 2024-03-09: qty 1

## 2024-03-09 MED ORDER — NICOTINE POLACRILEX 2 MG MT GUM
2.0000 mg | CHEWING_GUM | OROMUCOSAL | Status: DC | PRN
  Administered 2024-03-12: 2 mg via ORAL
  Filled 2024-03-09: qty 1

## 2024-03-09 MED ORDER — LISINOPRIL 20 MG PO TABS
20.0000 mg | ORAL_TABLET | Freq: Every day | ORAL | Status: DC
  Administered 2024-03-10 – 2024-03-15 (×6): 20 mg via ORAL
  Filled 2024-03-09 (×7): qty 1

## 2024-03-09 MED ORDER — INSULIN GLARGINE-YFGN 100 UNIT/ML ~~LOC~~ SOLN
10.0000 [IU] | Freq: Every day | SUBCUTANEOUS | Status: DC
  Filled 2024-03-09: qty 0.1

## 2024-03-09 MED ORDER — METFORMIN HCL 500 MG PO TABS: 1000.0000 mg | ORAL_TABLET | Freq: Two times a day (BID) | ORAL | Status: DC

## 2024-03-09 MED ORDER — ACETAMINOPHEN 325 MG PO TABS: 650.0000 mg | ORAL_TABLET | Freq: Four times a day (QID) | ORAL | Status: DC | PRN

## 2024-03-09 MED ORDER — ESCITALOPRAM OXALATE 10 MG PO TABS
10.0000 mg | ORAL_TABLET | Freq: Every day | ORAL | Status: DC
  Administered 2024-03-10: 10 mg via ORAL
  Filled 2024-03-09 (×3): qty 1

## 2024-03-09 MED ORDER — MELATONIN 3 MG PO TABS
3.0000 mg | ORAL_TABLET | Freq: Every evening | ORAL | Status: DC | PRN
  Administered 2024-03-09: 3 mg via ORAL
  Filled 2024-03-09: qty 1

## 2024-03-09 MED ORDER — HALOPERIDOL LACTATE 5 MG/ML IJ SOLN: 10.0000 mg | Freq: Three times a day (TID) | INTRAMUSCULAR | Status: DC | PRN

## 2024-03-09 NOTE — [facility] Group Notes (Signed)
 Spirituality Group   Focus of discussion: Gratitude and Strength Awareness   Process: Following theoretical framework of group therapy of Irvin Yalom and further informed by Rogerian and Relational Cultural Theory approaches, participants invited to name:   Sources of gratitude (internal>external)   Articulate gratitude for self   Name a personal strength/gift/skill   Locate points of resonance among group members/engage the "here and now"   Conclude with grounding/breathwork  Observations: Tammy Hayes was present and participated in passive ways in the group discussion.  Alastair Hennes L. Minetta Aly, M.Div [phone]

## 2024-03-09 NOTE — Progress Notes (Signed)
 Pt has been accepted to [facility] on 05/14//2025 Bed assignment: 304-2  Pt meets inpatient criteria per: Bonnita Buttner NP  Attending Physician will be: Dr. Alver Jobs, MD   Report can be called [id number]: Adult unit: [phone]  Pt can arrive after consents are signed   Care Team Notified: [facility] Bevin Bucks RN, Lady Pier NP, Majorie Scrape RN, [place] Dimitri Fiscus RN    [place] Aaronjames Kelsay LCSW-A   03/09/2024 9:11 AM

## 2024-03-09 NOTE — ED Notes (Signed)
 Items moved to locker 37

## 2024-03-09 NOTE — ED Notes (Signed)
 Patient to room 37. Patient ambulated to room.  Patient calm and cooperative at this time. Patient oriented to unit and room.

## 2024-03-09 NOTE — ED Notes (Signed)
 Patient is eating and has something to drink

## 2024-03-09 NOTE — Tx Team (Signed)
 Initial Treatment Plan 03/09/2024 2:22 PM Tammy Hayes [id number]    PATIENT STRESSORS: Financial difficulties   Marital or family conflict     PATIENT STRENGTHS: General fund of knowledge  Motivation for treatment/growth    PATIENT IDENTIFIED PROBLEMS:   Suicide attempt    Family conflict    Living environment           DISCHARGE CRITERIA:  Improved stabilization in mood, thinking, and/or behavior  PRELIMINARY DISCHARGE PLAN: Outpatient therapy Placement in alternative living arrangements  PATIENT/FAMILY INVOLVEMENT: This treatment plan has been presented to and reviewed with the patient, Tammy Hayes. The patient has been given the opportunity to ask questions and make suggestions.  Wilton Hasting Dailee Manalang, RN 03/09/2024, 2:22 PM

## 2024-03-09 NOTE — Progress Notes (Signed)
 [facility] Admission Note:  Per ED report: Patient has a history of prediabetes, obesity, depression.  She was recently admitted to the hospital for suicidal ideations.  Patient was most recently admitted to the hospital on May 7 at [facility]. Patient states she took 15 of her 10 mg escitalopram tablets.  She denies any other medications but did report drinking some alcohol.   Pt admitted voluntarily to [facility] on 03/09/24. Pt denied SI/HI/AVH during admission. Pt reports that she vapes nicotine daily, nicorette gum ordered. Pt denies drug use. Pt reports occasional drinking reporting that that she generally only has 2-3 shots of tequila in a sitting. Pt's BAL 151 in the ED. Pt's skin assessed, pt has healed self-inflicted lacerations to bilateral forearms. Pt oriented to the unit and provided with a sandwich tray. Pt remained calm and cooperative throughout admission.

## 2024-03-09 NOTE — ED Notes (Signed)
 Patient of unit to [facility] per provider. Patient alert, calm, cooperative, no s/s of distress at this time. Patient ambulatory off unit, escorted by RN. Patient transported by [employer].

## 2024-03-09 NOTE — Plan of Care (Signed)
   Problem: Education: Goal: Knowledge of [place] [facility] Education information/materials will improve Outcome: Progressing Goal: Verbalization of understanding the information provided will improve Outcome: Progressing

## 2024-03-09 NOTE — BH Assessment (Signed)
 Comprehensive Clinical Assessment (CCA) Note   03/09/2024 TEASA JESS [id number]   Disposition: Bonnita Buttner, NP recommends inpatient hospitalization   The patient demonstrates the following risk factors for suicide: Chronic risk factors for suicide include: previous suicide attempts  . Acute risk factors for suicide include: family or marital conflict. Protective factors for this patient include: positive social support. Considering these factors, the overall suicide risk at this point appears to be high. Patient is not appropriate for outpatient follow up.   Per EDP's note: "Patient has a history of prediabetes, obesity, depression.  She was recently admitted to the hospital for suicidal ideations.  Patient was most recently admitted to the hospital on May 7 at [facility].  Patient denies feeling suicidal she states she was just trying to sleep.  She started to feel funny so she told her parents and they called the ambulance.  Patient states she took 15 of her 10 mg escitalopram tablets.  She denies any other medications but did report drinking some alcohol.  Patient states she does not want to be at behavioral health hospital again"  On evaluation, patient is alert, oriented x 3, and cooperative. Speech is clear, coherent and logical. Pt appears casual. Eye contact is fair. Mood is anxious and depressed, affect is congruent with mood. Thought process is logical and thought content is coherent.Pt reports that she got upset with her grandmother after an argument and decided to take an overdose. Pt has hx of SI attempts with the last prior attempt 2 weeks ago. Pt denies HI/ AVH.  There is no indication that the patient is responding to internal stimuli. No delusions elicited during this assessment.      Chief Complaint:  Chief Complaint  Patient presents with   Drug Overdose   Visit Diagnosis:  Major Depressive Disorder     CCA Screening, Triage and Referral (STR)  Patient  Reported Information How did you hear about us ? -- ([facility] ED)  What Is the Reason for Your Visit/Call Today? Per EDP's note: "Patient has a history of prediabetes, obesity, depression.  She was recently admitted to the hospital for suicidal ideations.  Patient was most recently admitted to the hospital on May 7 at [facility].  Patient denies feeling suicidal she states she was just trying to sleep.  She started to feel funny so she told her parents and they called the ambulance.  Patient states she took 15 of her 10 mg escitalopram tablets.  She denies any other medications but did report drinking some alcohol.  Patient states she does not want to be at behavioral health hospital again"  How Long Has This Been Causing You Problems? > than 6 months  What Do You Feel Would Help You the Most Today? Treatment for Depression or other mood problem; Stress Management; Medication(s)   Have You Recently Had Any Thoughts About Hurting Yourself? Yes  Are You Planning to Commit Suicide/Harm Yourself At This time? Yes (Pt overdosed on medications)   Flowsheet Row ED from 03/08/2024 in [facility] Emergency Department at [facility] ED to Hosp-Admission (Discharged) from 01/31/2024 in [facility] ED from 05/28/2023 in [facility] Emergency Department at [facility]  C-SSRS RISK CATEGORY High Risk High Risk No Risk       Have you Recently Had Thoughts About Hurting Someone Marigene Shoulder? No  Are You Planning to Harm Someone at This Time? No  Explanation: Denies HI  Have You Used Any Alcohol or Drugs in the Past 24 Hours? Yes  How Long Ago Did You Use Drugs or Alcohol? yesterday  What Did You Use and How Much? ETOH 2-3 shots ; denies drug use   Do You Currently Have a Therapist/Psychiatrist? Yes  Name of Therapist/Psychiatrist: Name of Therapist/Psychiatrist: Therapist at [facility] unable to recall name   Have You Been Recently Discharged  From Any Office Practice or Programs? No  Explanation of Discharge From Practice/Program: n/a    CCA Screening Triage Referral Assessment Type of Contact: Tele-Assessment  Telemedicine Service Delivery: Telemedicine service delivery: This service was provided via telemedicine using a 2-way, interactive audio and video technology  Is this Initial or Reassessment? Is this Initial or Reassessment?: Initial Assessment  Date Telepsych consult ordered in [facility]:  Date Telepsych consult ordered in [facility]: 03/08/24  Time Telepsych consult ordered in [facility]:  Time Telepsych consult ordered in [facility]: 2313  Location of Assessment: [facility] ED  Provider Location: [facility] Assessment Services   Collateral Involvement: chart review   Does Patient Have a Automotive engineer Guardian? No  Legal Guardian Contact Information: n/a  Copy of Legal Guardianship Form: -- (n/a)  Legal Guardian Notified of Arrival: -- (n/a)  Legal Guardian Notified of Pending Discharge: -- (n/a)  If Minor and Not Living with Parent(s), Who has Custody? n/a  Is CPS involved or ever been involved? Never  Is APS involved or ever been involved? Never   Patient Determined To Be At Risk for Harm To Self or Others Based on Review of Patient Reported Information or Presenting Complaint? Yes, for Self-Harm  Method: Plan with intent and identified person (Plan for SI/Means and Intent. Pt denied  HI.)  Availability of Means: In hand or used  Intent: Clearly intends on inflicting harm that could cause death  Notification Required: No need or identified person  Additional Information for Danger to Others Potential: -- (Pt denied HI.)  Additional Comments for Danger to Others Potential: Pt denied HI.  Are There Guns or Other Weapons in Your Home? No  Types of Guns/Weapons: None  Are These Weapons Safely Secured?                            No (Pt denied access to weapons.)  Who Could Verify You Are Able To Have These Secured: Pt  denied access to weapons.  Do You Have any Outstanding Charges, Pending Court Dates, Parole/Probation? Pt denies pending legal charges  Contacted To Inform of Risk of Harm To Self or Others: -- (n/a)    Does Patient Present under Involuntary Commitment? No    [place] of Residence: [name]   Patient Currently Receiving the Following Services: Individual Therapy; Medication Management   Determination of Need: Urgent (48 hours)   Options For Referral: Medication Management; Inpatient Hospitalization     CCA Biopsychosocial Patient Reported Schizophrenia/Schizoaffective Diagnosis in Past: No   Strengths: Pt was able to express feelings and emotions   Mental Health Symptoms Depression:  Change in energy/activity; Difficulty Concentrating; Hopelessness; Worthlessness; Increase/decrease in appetite; Sleep (too much or little); Irritability   Duration of Depressive symptoms: Duration of Depressive Symptoms: Greater than two weeks   Mania:  None   Anxiety:   Worrying; Tension; Sleep; Difficulty concentrating; Fatigue; Irritability   Psychosis:  None   Duration of Psychotic symptoms:    Trauma:  Difficulty staying/falling asleep; Avoids reminders of event; Guilt/shame   Obsessions:  None  Compulsions:  None   Inattention:  None   Hyperactivity/Impulsivity:  None   Oppositional/Defiant Behaviors:  None   Emotional Irregularity:  Potentially harmful impulsivity; Unstable self-image; Chronic feelings of emptiness   Other Mood/Personality Symptoms:  None noted    Mental Status Exam Appearance and self-care  Stature:  Average   Weight:  Average weight   Clothing:  Casual   Grooming:  Normal   Cosmetic use:  Age appropriate   Posture/gait:  Normal   Motor activity:  Not Remarkable   Sensorium  Attention:  Normal   Concentration:  Normal   Orientation:  X5   Recall/memory:  Normal   Affect and Mood  Affect:  Appropriate; Depressed; Flat    Mood:  Depressed   Relating  Eye contact:  Normal   Facial expression:  Depressed   Attitude toward examiner:  Cooperative   Thought and Language  Speech flow: Clear and Coherent   Thought content:  Appropriate to Mood and Circumstances   Preoccupation:  None   Hallucinations:  None   Organization:  Coherent   [employer] of Knowledge:  Average   Intelligence:  Average   Abstraction:  Normal   Judgement:  Poor; Dangerous   Reality Testing:  Adequate   Insight:  Gaps   Decision Making:  Impulsive   Social Functioning  Social Maturity:  Impulsive   Social Judgement:  Normal   Stress  Stressors:  Work; Surveyor, quantity; Family conflict; Housing   Coping Ability:  Exhausted; Overwhelmed   Skill Deficits:  Self-control; Decision making   Supports:  Family     Religion: Religion/Spirituality Are You A Religious Person?: No How Might This Affect Treatment?: N/a  Leisure/Recreation: Leisure / Recreation Do You Have Hobbies?: No  Exercise/Diet: Exercise/Diet Do You Exercise?: No Have You Gained or Lost A Significant Amount of Weight in the Past Six Months?: No Do You Follow a Special Diet?: No Do You Have Any Trouble Sleeping?: No   CCA Employment/Education Employment/Work Situation: Employment / Work Situation Employment Situation: Unemployed Patient's Job has Been Impacted by Current Illness: No Has Patient ever Been in Equities trader?: No  Education: Education Is Patient Currently Attending School?: No Last Grade Completed: 12 Did You Product manager?: No Did You Have An Individualized Education Program (IIEP): No Did You Have Any Difficulty At [employer]?: No Patient's Education Has Been Impacted by Current Illness: No   CCA Family/Childhood History Family and Relationship History: Family history Marital status: Single Does patient have children?: No  Childhood History:  Childhood History By whom was/is the patient raised?:  Both parents Did patient suffer any verbal/emotional/physical/sexual abuse as a child?: No Did patient suffer from severe childhood neglect?: No Has patient ever been sexually abused/assaulted/raped as an adolescent or adult?: No Was the patient ever a victim of a crime or a disaster?: No Witnessed domestic violence?: No Has patient been affected by domestic violence as an adult?: No       CCA Substance Use Alcohol/Drug Use: Alcohol / Drug Use Pain Medications: SEE MAR Prescriptions: SEE MAR Over the Counter: SEE MAR History of alcohol / drug use?: Yes Longest period of sobriety (when/how long): Unknown Negative Consequences of Use:  (pt reports drinking to mask her feelings of depression.) Withdrawal Symptoms: None Substance #1 Name of Substance 1: ETOH 1 - Amount (size/oz): 2-3 shots 1 - Frequency: 2-3 times a week 1 - Last Use / Amount: yesterday  ASAM's:  Six Dimensions of Multidimensional Assessment  Dimension 1:  Acute Intoxication and/or Withdrawal Potential:   Dimension 1:  Description of individual's past and current experiences of substance use and withdrawal: No withdrawal symptoms notes.  Dimension 2:  Biomedical Conditions and Complications:   Dimension 2:  Description of patient's biomedical conditions and  complications: Pt denies biomedical conditions/complications  Dimension 3:  Emotional, Behavioral, or Cognitive Conditions and Complications:  Dimension 3:  Description of emotional, behavioral, or cognitive conditions and complications: Pt shares thoughts of suicide and increased depression  Dimension 4:  Readiness to Change:  Dimension 4:  Description of Readiness to Change criteria: Pt does not identify readiness to change  Dimension 5:  Relapse, Continued use, or Continued Problem Potential:  Dimension 5:  Relapse, continued use, or continued problem potential critiera description: Pt does not identify the correlation between SA  and MH  Dimension 6:  Recovery/Living Environment:  Dimension 6:  Recovery/Iiving environment criteria description: Pt reports living with her grandmother , 2 aunts, and sister  ASAM Severity Score: ASAM's Severity Rating Score: 9  ASAM Recommended Level of Treatment: ASAM Recommended Level of Treatment: Level II Intensive Outpatient Treatment   Substance use Disorder (SUD) Substance Use Disorder (SUD)  Checklist Symptoms of Substance Use: Continued use despite having a persistent/recurrent physical/psychological problem caused/exacerbated by use, Repeated use in physically hazardous situations, Continued use despite persistent or recurrent social, interpersonal problems, caused or exacerbated by use  Recommendations for Services/Supports/Treatments: Recommendations for Services/Supports/Treatments Recommendations For Services/Supports/Treatments: Medication Management, Inpatient Hospitalization, Individual Therapy  Disposition Recommendation per psychiatric provider: We recommend inpatient psychiatric hospitalization when medically cleared. Patient is under voluntary admission status at this time; please IVC if attempts to leave hospital.   DSM5 Diagnoses: Patient Active Problem List   Diagnosis Date Noted   Diabetes mellitus with hyperglycemia (HCC) 01/31/2024   Suicide attempt (HCC) 01/31/2024   Class 2 obesity 01/31/2024   DKA (diabetic ketoacidosis) (HCC) 01/31/2024   Gonorrhea 11/05/2022   Anxiety and depression 05/21/2022   MDD (major depressive disorder), recurrent severe, without psychosis (HCC) 05/20/2022   Suicidal ideation 05/20/2022   Adjustment disorder with depressed mood 05/15/2022   Urinary tract infection without hematuria 05/15/2022   Symptomatic anemia 08/14/2020   Pancytopenia (HCC) 08/14/2020   Thrombocytopenia (HCC) 08/14/2020   HSIL (high grade squamous intraepithelial lesion) on Pap smear of cervix 05/09/2020   Depressive episode 05/09/2020   Hypertension  05/03/2019   Iron deficiency anemia 04/19/2019   Menorrhagia 04/19/2019   Prediabetes 07/06/2014   Acanthosis nigricans 05/09/2014   Learning disability 07/13/2013     Referrals to Alternative Service(s): Referred to Alternative Service(s):   Place:   Date:   Time:    Referred to Alternative Service(s):   Place:   Date:   Time:    Referred to Alternative Service(s):   Place:   Date:   Time:    Referred to Alternative Service(s):   Place:   Date:   Time:      [place] Do, [place], [facility], NCC

## 2024-03-10 DIAGNOSIS — F411 Generalized anxiety disorder: Secondary | ICD-10-CM | POA: Diagnosis present

## 2024-03-10 DIAGNOSIS — F332 Major depressive disorder, recurrent severe without psychotic features: Secondary | ICD-10-CM | POA: Diagnosis not present

## 2024-03-10 LAB — GLUCOSE, CAPILLARY
Glucose-Capillary: 161 mg/dL — ABNORMAL HIGH (ref 70–99)
Glucose-Capillary: 164 mg/dL — ABNORMAL HIGH (ref 70–99)

## 2024-03-10 MED ORDER — ESCITALOPRAM OXALATE 10 MG PO TABS: 10.0000 mg | ORAL_TABLET | Freq: Every day | ORAL | Status: DC

## 2024-03-10 MED ORDER — TRAZODONE HCL 50 MG PO TABS
50.0000 mg | ORAL_TABLET | Freq: Every day | ORAL | Status: DC
  Administered 2024-03-10 – 2024-03-14 (×5): 50 mg via ORAL
  Filled 2024-03-10 (×6): qty 1

## 2024-03-10 MED ORDER — FLUOXETINE HCL 20 MG PO CAPS
20.0000 mg | ORAL_CAPSULE | Freq: Every day | ORAL | Status: DC
  Administered 2024-03-11 – 2024-03-15 (×5): 20 mg via ORAL
  Filled 2024-03-10 (×6): qty 1

## 2024-03-10 MED ORDER — INSULIN ASPART 100 UNIT/ML IJ SOLN
4.0000 [IU] | Freq: Three times a day (TID) | INTRAMUSCULAR | Status: DC
  Administered 2024-03-10 – 2024-03-15 (×15): 4 [IU] via SUBCUTANEOUS

## 2024-03-10 MED ORDER — HYDROXYZINE HCL 25 MG PO TABS
25.0000 mg | ORAL_TABLET | Freq: Three times a day (TID) | ORAL | Status: DC | PRN
Start: 2024-03-10 — End: 2024-03-15
  Administered 2024-03-10 – 2024-03-14 (×8): 25 mg via ORAL
  Filled 2024-03-10 (×7): qty 1

## 2024-03-10 MED ORDER — INSULIN ASPART 100 UNIT/ML IJ SOLN
0.0000 [IU] | Freq: Three times a day (TID) | INTRAMUSCULAR | Status: DC
  Administered 2024-03-10: 4 [IU] via SUBCUTANEOUS
  Administered 2024-03-11 (×2): 3 [IU] via SUBCUTANEOUS
  Administered 2024-03-11: 4 [IU] via SUBCUTANEOUS
  Administered 2024-03-12: 3 [IU] via SUBCUTANEOUS
  Administered 2024-03-12 – 2024-03-13 (×2): 7 [IU] via SUBCUTANEOUS
  Administered 2024-03-13 – 2024-03-15 (×5): 3 [IU] via SUBCUTANEOUS
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
## 2024-03-10 NOTE — Group Note (Unsigned)
 Date:  03/11/2024 Time:  2:05 AM  Group Topic/Focus:  Wrap-Up Group:   The focus of this group is to help patients review their daily goal of treatment and discuss progress on daily workbooks.    Participation Level:  Active  Participation Quality:  Appropriate and Sharing  Affect:  Appropriate  Cognitive:  Appropriate  Insight: Appropriate  Engagement in Group:  Engaged  Modes of Intervention:  Activity and Socialization  Additional Comments:  Patient attended and participated in wrap up group. Patient shared about their day and participated in activity.   Dillard Frame 03/11/2024, 2:05 AM

## 2024-03-10 NOTE — Progress Notes (Signed)
   03/09/24 2107  Psych Admission Type (Psych Patients Only)  Admission Status Voluntary  Psychosocial Assessment  Patient Complaints Anxiety;Depression  Eye Contact Fair  Facial Expression Animated  Affect Anxious  Speech Logical/coherent  Interaction Assertive  Motor Activity Slow  Appearance/Hygiene Unremarkable  Behavior Characteristics Cooperative;Appropriate to situation  Mood Depressed;Anxious  Thought Process  Coherency WDL  Content WDL  Delusions None reported or observed  Perception WDL  Hallucination None reported or observed  Judgment Impaired  Confusion None  Danger to Self  Current suicidal ideation? Denies  Danger to Others  Danger to Others None reported or observed

## 2024-03-10 NOTE — Plan of Care (Signed)
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
## 2024-03-10 NOTE — Group Note (Signed)
 LCSW Group Therapy Note   Group Date: 03/10/2024 Start Time: 1100 End Time: 1200   Participation:  patient was present.  She listened and was respectful, but didn't participate in the conversation.    Type of Therapy:  Group Therapy  Topic:  Understanding your Path to Change     Objective:  The goal is to help individuals understand the stages of change, identify where they currently are in the process, and provide actionable next steps to continue moving forward in their journey of change.  Goals: - Learn about the six stages of change:  Precontemplation, Contemplation, Preparation, Action, Maintenance, and Relapse - Reflect on Current Change Efforts:  Recognize which stage participants are in regarding a personal change. - Plan Next Steps for Moving Forward:  Create an action plan based on their current stage of change.  Class Summary: In this session, we explored the Stages of Change as a framework to understand the process of change.  We discussed how each stage helps individuals recognize where they are in their personal journey and used the Stages of Change Worksheet for self-reflection. Participants answered questions to better understand their current stage, challenges, and progress. We also emphasized the importance of moving forward, even if setbacks (Relapse) occur, and created actionable steps to help participants continue progressing. By the end of the session, participants gained a clearer understanding of their path to change and left with a clear plan for next steps.  Modalities:  Elements of CBT (cognitive restructuring, problem solving)  Element of DBT (mindfulness, distress tolerance)   Genevie Elman O Aitanna Haubner, LCSWA 03/10/2024  2:47 PM

## 2024-03-10 NOTE — Progress Notes (Signed)
   03/10/24 1000  Psych Admission Type (Psych Patients Only)  Admission Status Voluntary  Psychosocial Assessment  Patient Complaints Depression  Eye Contact Fair  Facial Expression Animated  Affect Anxious  Speech Logical/coherent  Interaction Assertive  Motor Activity Slow  Appearance/Hygiene Unremarkable  Behavior Characteristics Cooperative  Mood Depressed  Thought Process  Coherency WDL  Content WDL  Delusions None reported or observed  Perception WDL  Hallucination None reported or observed  Judgment Limited  Confusion None  Danger to Self  Current suicidal ideation? Denies  Danger to Others  Danger to Others None reported or observed

## 2024-03-10 NOTE — [facility] Suicide Risk Assessment (Signed)
 Suicide Risk Assessment  Admission Assessment    [facility] Admission Suicide Risk Assessment   Nursing information obtained from:  Patient  Demographic factors:  Adolescent or young adult  Current Mental Status:  Suicidal ideation indicated by patient  Loss Factors:  Financial problems / change in socioeconomic status  Historical Factors:  Impulsivity  Risk Reduction Factors:  Living with another person, especially a relative  Total Time spent with patient: 1.5 hours including the H&P.  Principal Problem: MDD (major depressive disorder)  Diagnosis:  Principal Problem:   MDD (major depressive disorder)  Subjective Data:See H&P.  Continued Clinical Symptoms:  Alcohol Use Disorder Identification Test Final Score (AUDIT): 1 The "Alcohol Use Disorders Identification Test", Guidelines for Use in Primary Care, Second Edition.  World Science writer [facility]). Score between 0-7:  no or low risk or alcohol related problems. Score between 8-15:  moderate risk of alcohol related problems. Score between 16-19:  high risk of alcohol related problems. Score 20 or above:  warrants further diagnostic evaluation for alcohol dependence and treatment.  CLINICAL FACTORS:   Depression:   Comorbid alcohol abuse/dependence Hopelessness Impulsivity Insomnia Alcohol/Substance Abuse/Dependencies More than one psychiatric diagnosis Unstable or Poor Therapeutic Relationship Previous Psychiatric Diagnoses and Treatments Medical Diagnoses and Treatments/Surgeries   Musculoskeletal: Strength & Muscle Tone: within normal limits Gait & Station: normal Patient leans: N/A  Psychiatric Specialty Exam:  Presentation  General Appearance: Casual; Fairly Groomed  Eye Contact:Good  Speech:Clear and Coherent; Normal Rate  Speech Volume:Normal  Handedness:Right   Mood and Affect  Mood:Depressed  Affect:Congruent   Thought Process  Thought Processes:Coherent  Descriptions of  Associations:Intact  Orientation:Full (Time, Place and Person)  Thought Content:Logical  History of Schizophrenia/Schizoaffective disorder:No  Duration of Psychotic Symptoms:No data recorded Hallucinations:Hallucinations: None  Ideas of Reference:No data recorded Suicidal Thoughts:Suicidal Thoughts: No  Homicidal Thoughts:Homicidal Thoughts: No   Sensorium  Memory:Immediate Good; Recent Good; Remote Good  Judgment:Poor  Insight:Fair   Executive Functions  Concentration:Good  Attention Span:Good  Recall:Good  Fund of Knowledge:Fair  Language:Good  Psychomotor Activity  Psychomotor Activity:Psychomotor Activity: Normal  Assets  Assets:Communication Skills; Desire for Improvement; Housing; Resilience; Social Support  Sleep  Sleep:Sleep: Fair Number of Hours of Sleep: 5.5  Physical Exam: See H&P.  Blood pressure (!) 140/83, pulse 96, temperature 98.5 F (36.9 C), temperature source Oral, resp. rate 16, height 5\' 6"  (1.676 m), weight 111.1 kg, last menstrual period 02/15/2024, SpO2 100%. Body mass index is 39.54 kg/m.   COGNITIVE FEATURES THAT CONTRIBUTE TO RISK:  Polarized thinking and Thought constriction (tunnel vision)    SUICIDE RISK:   Severe:  Frequent, intense, and enduring suicidal ideation, specific plan, no subjective intent, but some objective markers of intent (i.e., choice of lethal method), the method is accessible, some limited preparatory behavior, evidence of impaired self-control, severe dysphoria/symptomatology, multiple risk factors present, and few if any protective factors, particularly a lack of social support.  PLAN OF CARE: See H&P.  I certify that inpatient services furnished can reasonably be expected to improve the patient's condition.   Asuncion Layer, NP, pmhnp, fnp-bc. 03/10/2024, 12:56 PM

## 2024-03-10 NOTE — [facility] Group Notes (Signed)
 The focus of this group is to help patients establish daily goals to achieve during treatment and discuss how the patient can incorporate goal setting into their daily lives to aide in recovery.        Scale 1-10  7    Goal: To get more sleep

## 2024-03-10 NOTE — Group Note (Signed)
 Occupational Therapy Group Note  Group Topic: Sleep Hygiene  Group Date: 03/10/2024 Start Time: 1400 End Time: 1430 Facilitators: Lynnda Sas, OT   Group Description: Group encouraged increased participation and engagement through topic focused on sleep hygiene. Patients reflected on the quality of sleep they typically receive and identified areas that need improvement. Group was given background information on sleep and sleep hygiene, including common sleep disorders. Group members also received information on how to improve one's sleep and introduced a sleep diary as a tool that can be utilized to track sleep quality over a length of time. Group session ended with patients identifying one or more strategies they could utilize or implement into their sleep routine in order to improve overall sleep quality.        Therapeutic Goal(s):  Identify one or more strategies to improve overall sleep hygiene  Identify one or more areas of sleep that are negatively impacted (sleep too much, too little, etc)     Participation Level: Engaged   Participation Quality: Independent   Behavior: Appropriate   Speech/Thought Process: Relevant   Affect/Mood: Appropriate   Insight: Fair   Judgement: Fair      Modes of Intervention: Education  Patient Response to Interventions:  Attentive   Plan: Continue to engage patient in OT groups 2 - 3x/week.  03/10/2024  Lynnda Sas, OT   Tammy Hayes, OT

## 2024-03-10 NOTE — Progress Notes (Signed)
   03/10/24 2200  Psych Admission Type (Psych Patients Only)  Admission Status Voluntary  Psychosocial Assessment  Patient Complaints Anxiety  Eye Contact Fair  Facial Expression Animated  Affect Appropriate to circumstance  Speech Logical/coherent  Interaction Assertive  Motor Activity Slow  Appearance/Hygiene Unremarkable  Behavior Characteristics Cooperative  Mood Pleasant  Thought Process  Coherency WDL  Content WDL  Delusions None reported or observed  Perception WDL  Hallucination None reported or observed  Judgment Limited  Confusion None  Danger to Self  Current suicidal ideation? Denies  Danger to Others  Danger to Others None reported or observed

## 2024-03-11 ENCOUNTER — Encounter (HOSPITAL_COMMUNITY): Payer: Self-pay

## 2024-03-11 DIAGNOSIS — F332 Major depressive disorder, recurrent severe without psychotic features: Secondary | ICD-10-CM | POA: Diagnosis not present

## 2024-03-11 LAB — GLUCOSE, CAPILLARY
Glucose-Capillary: 146 mg/dL — ABNORMAL HIGH (ref 70–99)
Glucose-Capillary: 160 mg/dL — ABNORMAL HIGH (ref 70–99)
Glucose-Capillary: 136 mg/dL — ABNORMAL HIGH (ref 70–99)
Glucose-Capillary: 149 mg/dL — ABNORMAL HIGH (ref 70–99)

## 2024-03-11 MED ORDER — ARIPIPRAZOLE 5 MG PO TABS
5.0000 mg | ORAL_TABLET | Freq: Every day | ORAL | Status: DC
  Administered 2024-03-11 – 2024-03-14 (×4): 5 mg via ORAL
  Filled 2024-03-11 (×5): qty 1

## 2024-03-11 NOTE — [facility] Suicide Risk Assessment (Addendum)
 [facility] INPATIENT:  Family/Significant Other Suicide Prevention Education  Suicide Prevention Education:  Contact Attempts: Tammy Hayes, aunt, [phone], (name of family member/significant other) has been identified by the patient as the family member/significant other with whom the patient will be residing, and identified as the person(s) who will aid the patient in the event of a mental health crisis.  With written consent from the patient, two attempts were made to provide suicide prevention education, prior to and/or following the patient's discharge.  We were unsuccessful in providing suicide prevention education.  A suicide education pamphlet was given to the patient to share with family/significant other.  Date and time of first attempt:03/11/2024 at 9:56 AM  Date and time of second attempt: second attempt needed   CSW left HIPAA compliant VM requesting return call  Tammy Hayes 03/11/2024, 9:57 AM

## 2024-03-11 NOTE — Group Note (Signed)
 Recreation Therapy Group Note   Group Topic:Problem Solving  Group Date: 03/11/2024 Start Time: 0935 End Time: 0958 Facilitators: Tamekia Rotter-McCall, LRT,CTRS Location: [address] Dayroom   Group Topic: Communication, Team Building, Problem Solving  Goal Area(s) Addresses:  Patient will effectively work with peer towards shared goal.  Patient will identify skills used to make activity successful.  Patient will identify how skills used during activity can be used to reach post d/c goals.   Intervention: STEM Activity  Activity: Stage manager. In teams of 3-5, patients were given 12 plastic drinking straws and an equal length of masking tape. Using the materials provided, patients were asked to build a landing pad to catch a golf ball dropped from approximately 5 feet in the air. All materials were required to be used by the team in their design. LRT facilitated post-activity discussion.  Education: Pharmacist, community, Scientist, physiological, Discharge Planning   Education Outcome: Acknowledges education/In group clarification offered/Needs additional education.    Affect/Mood: Appropriate   Participation Level: Active   Participation Quality: Independent   Behavior: Appropriate   Speech/Thought Process: Focused   Insight: Moderate   Judgement: Moderate   Modes of Intervention: STEM Activity   Patient Response to Interventions:  Engaged   Education Outcome:  In group clarification offered    Clinical Observations/Individualized Feedback: Pt had a quiet demeanor but was engaged with the activity. Pt worked well with peers in assisting where needed. Pt was appropriate and attentive during group session.    Plan: Continue to engage patient in RT group sessions 2-3x/week.   Marycarmen Hagey-McCall, LRT,CTRS 03/11/2024 12:01 PM
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
## 2024-03-11 NOTE — Progress Notes (Signed)
 [facility] MD Progress Note  03/12/2024 3:25 PM Tammy Hayes  MRN:  [id number]  Reason for admission: 28 year old AA female with prior hx of mental illness (depression), previous psychiatric admissions, treatments & an outpatient psychiatric care with. Admitted to the [facility] from the [facility] with complaint of suicide attempt by overdose on 3 tablets of Lexapro  10 mg. A review of his lab results has shown that his BAL is 151 & hgba1c is 12.8. Patient was taken to the [facility] with complaints of suicide attempt by overdose on Lexapro  10 mg tabs (15 tablets per chart review) however, only (3 tablets) per patient's reports. Labs results reviewed, HGBa1c 12.8 & BAL 151. Her Liver enzymes are within norm.   Daily notes: Tammy Hayes is seen today in her room. She was lying down in bed.. Chart reviewed. The chart findings discussed with the treatment. She presents alert, oriented & aware of situation. She is visible on the unit, attending group sessions. She presents with a good affect, good eye contact & verbally responsive. She reports, "My mood is improving. I'm doing well on my medicines. I have been attending group sessions. I'm lying down in my room because I took some medicine to help me have a bowel movement. I have used the bathroom one at around 11:00 am this morning. I feel like I will be using the bathroom again. That is the reason I'm in my room now. I slept well last night. I'm taking my medicines. No side effect to report". Desmond currently denies any SIHI, AVH, delusional thoughts or paranoia. She does not appear to be responding to any internal stimuli. She was started on Abilify  5 mg yesterday for AH. She is currently tolerating her medications. There are no diabetes distress reported by staff. Vital signs remain stable. There are no new lab results. Denies any withdrawal symptoms. Continue current plan of care as already in progress.   Principal Problem: MDD (major depressive disorder),  recurrent severe, without psychosis (HCC) Diagnosis: Principal Problem:   MDD (major depressive disorder), recurrent severe, without psychosis (HCC) Active Problems:   MDD (major depressive disorder)   Generalized anxiety disorder  Total Time spent with patient: 45 minutes  Past Psychiatric History: See H&P.  Past Medical History:  Past Medical History:  Diagnosis Date   Bilateral ovarian cysts    Depression    Pediatric overweight 07/13/2013   Pre-diabetes     Past Surgical History:  Procedure Laterality Date   EYE MUSCLE SURGERY     in 3rd grade. No reported residual weakness.    Family History:  Family History  Problem Relation Age of Onset   Depression Mother        grandparents, aunts/uncles.    Heart disease Other        aunts/uncles   Hypertension Other        aunts/uncles, grandparents   Kidney disease Other        aunts/uncles, grandparents   Stroke Other        aunts/uncles   Cancer Neg Hx    Family Psychiatric  History: See H&P.  Social History:  Social History   Substance and Sexual Activity  Alcohol Use Not Currently     Social History   Substance and Sexual Activity  Drug Use Yes   Types: Marijuana   Comment: In the past used marijuana. None in about one year.    Social History   Socioeconomic History   Marital status: Single  Spouse name: Not on file   Number of children: Not on file   Years of education: Not on file   Highest education level: Not on file  Occupational History   Not on file  Tobacco Use   Smoking status: Never   Smokeless tobacco: Never  Vaping Use   Vaping status: Some Days  Substance and Sexual Activity   Alcohol use: Not Currently   Drug use: Yes    Types: Marijuana    Comment: In the past used marijuana. None in about one year.   Sexual activity: Yes    Partners: Male    Birth control/protection: None  Other Topics Concern   Not on file  Social History Narrative   Lives with father 60% of the time.  Student at [employer] finished 7th grade.    Christian per father.    Social Drivers of Corporate investment banker Strain: Not on file  Food Insecurity: Food Insecurity Present (03/09/2024)   Hunger Vital Sign    Worried About Running Out of Food in the Last Year: Sometimes true    Ran Out of Food in the Last Year: Sometimes true  Transportation Needs: Unmet Transportation Needs (03/09/2024)   PRAPARE - Administrator, Civil Service (Medical): Yes    Lack of Transportation (Non-Medical): Yes  Physical Activity: Not on file  Stress: Not on file  Social Connections: Socially Isolated (01/31/2024)   Social Connection and Isolation Panel [NHANES]    Frequency of Communication with Friends and Family: Once a week    Frequency of Social Gatherings with Friends and Family: Once a week    Attends Religious Services: More than 4 times per year    Active Member of [employer] or Organizations: No    Attends Engineer, structural: Never    Marital Status: Never married   Additional Social History:   Sleep: Good  Appetite:  Good  Current Medications: Current Facility-Administered Medications  Medication Dose Route Frequency Provider Last Rate Last Admin   acetaminophen  (TYLENOL ) tablet 650 mg  650 mg Oral Q6H PRN Weber, Kyra A, NP       alum & mag hydroxide-simeth (MAALOX/MYLANTA) 200-200-20 MG/5ML suspension 30 mL  30 mL Oral Q4H PRN Weber, Kyra A, NP       ARIPiprazole  (ABILIFY ) tablet 5 mg  5 mg Oral Daily Yisel Megill I, NP   5 mg at 03/12/24 1610   haloperidol  (HALDOL ) tablet 5 mg  5 mg Oral TID PRN Weber, Kyra A, NP       And   diphenhydrAMINE  (BENADRYL ) capsule 50 mg  50 mg Oral TID PRN Weber, Kyra A, NP       haloperidol  lactate (HALDOL ) injection 5 mg  5 mg Intramuscular TID PRN Weber, Kyra A, NP       And   diphenhydrAMINE  (BENADRYL ) injection 50 mg  50 mg Intramuscular TID PRN Weber, Kyra A, NP       And   LORazepam  (ATIVAN ) injection 2 mg  2 mg  Intramuscular TID PRN Weber, Kyra A, NP       haloperidol  lactate (HALDOL ) injection 10 mg  10 mg Intramuscular TID PRN Weber, Kyra A, NP       And   diphenhydrAMINE  (BENADRYL ) injection 50 mg  50 mg Intramuscular TID PRN Weber, Kyra A, NP       And   LORazepam  (ATIVAN ) injection 2 mg  2 mg Intramuscular TID PRN Weber, Kyra A, NP  FLUoxetine  (PROZAC ) capsule 20 mg  20 mg Oral Daily Diamonique Ruedas I, NP   20 mg at 03/12/24 0744   hydrOXYzine  (ATARAX ) tablet 25 mg  25 mg Oral TID PRN Asuncion Layer I, NP   25 mg at 03/12/24 0743   ibuprofen  (ADVIL ) tablet 600 mg  600 mg Oral Q8H PRN Weber, Kyra A, NP       insulin  aspart (novoLOG ) injection 0-20 Units  0-20 Units Subcutaneous TID WC Exzavier Ruderman, Devra Fontana I, NP   7 Units at 03/12/24 1205   insulin  aspart (novoLOG ) injection 4 Units  4 Units Subcutaneous TID WC Asuncion Layer I, NP   4 Units at 03/12/24 1205   insulin  glargine-yfgn (SEMGLEE ) injection 10 Units  10 Units Subcutaneous QHS Weber, Kyra A, NP   10 Units at 03/11/24 2122   lisinopril  (ZESTRIL ) tablet 20 mg  20 mg Oral Daily Weber, Kyra A, NP   20 mg at 03/12/24 0744   magnesium  hydroxide (MILK OF MAGNESIA) suspension 30 mL  30 mL Oral Daily PRN Weber, Kyra A, NP   30 mL at 03/12/24 0745   metFORMIN  (GLUCOPHAGE ) tablet 1,000 mg  1,000 mg Oral BID WC Weber, Kyra A, NP   1,000 mg at 03/12/24 0744   nicotine  polacrilex (NICORETTE ) gum 2 mg  2 mg Oral PRN Alver Jobs, MD   2 mg at 03/12/24 1456   ondansetron  (ZOFRAN ) tablet 4 mg  4 mg Oral Q8H PRN Weber, Kyra A, NP       traZODone  (DESYREL ) tablet 50 mg  50 mg Oral QHS Braxley Balandran I, NP   50 mg at 03/11/24 2121    Lab Results:  Results for orders placed or performed during the hospital encounter of 03/09/24 (from the past 48 hours)  Glucose, capillary     Status: Abnormal   Collection Time: 03/10/24  4:57 PM  Result Value Ref Range   Glucose-Capillary 161 (H) 70 - 99 mg/dL    Comment: Glucose reference range applies only to samples taken after  fasting for at least 8 hours.  Glucose, capillary     Status: Abnormal   Collection Time: 03/10/24  8:59 PM  Result Value Ref Range   Glucose-Capillary 164 (H) 70 - 99 mg/dL    Comment: Glucose reference range applies only to samples taken after fasting for at least 8 hours.  Glucose, capillary     Status: Abnormal   Collection Time: 03/11/24  5:56 AM  Result Value Ref Range   Glucose-Capillary 146 (H) 70 - 99 mg/dL    Comment: Glucose reference range applies only to samples taken after fasting for at least 8 hours.   Comment 1 Notify RN    Comment 2 Document in Chart   Glucose, capillary     Status: Abnormal   Collection Time: 03/11/24 11:49 AM  Result Value Ref Range   Glucose-Capillary 160 (H) 70 - 99 mg/dL    Comment: Glucose reference range applies only to samples taken after fasting for at least 8 hours.   Comment 1 Notify RN    Comment 2 Document in Chart   Glucose, capillary     Status: Abnormal   Collection Time: 03/11/24  5:13 PM  Result Value Ref Range   Glucose-Capillary 136 (H) 70 - 99 mg/dL    Comment: Glucose reference range applies only to samples taken after fasting for at least 8 hours.  Glucose, capillary     Status: Abnormal   Collection Time: 03/11/24  9:02  PM  Result Value Ref Range   Glucose-Capillary 149 (H) 70 - 99 mg/dL    Comment: Glucose reference range applies only to samples taken after fasting for at least 8 hours.   Comment 1 Notify RN    Comment 2 Document in Chart   Glucose, capillary     Status: Abnormal   Collection Time: 03/12/24  5:57 AM  Result Value Ref Range   Glucose-Capillary 150 (H) 70 - 99 mg/dL    Comment: Glucose reference range applies only to samples taken after fasting for at least 8 hours.   Comment 1 Notify RN    Comment 2 Document in Chart   Glucose, capillary     Status: Abnormal   Collection Time: 03/12/24 12:03 PM  Result Value Ref Range   Glucose-Capillary 204 (H) 70 - 99 mg/dL    Comment: Glucose reference range  applies only to samples taken after fasting for at least 8 hours.    Blood Alcohol level:  Lab Results  Component Value Date   ETH 151 (H) 03/08/2024   ETH 205 (H) 01/31/2024    Metabolic Disorder Labs: Lab Results  Component Value Date   HGBA1C 12.8 (H) 01/31/2024   MPG 320.66 01/31/2024   No results found for: "PROLACTIN" Lab Results  Component Value Date   CHOL 139 05/20/2022   TRIG 65 05/20/2022   HDL 54 05/20/2022   CHOLHDL 2.6 05/20/2022   VLDL 13 05/20/2022   LDLCALC 72 05/20/2022    Physical Findings: AIMS:  , ,  ,  ,    CIWA:    COWS:     Musculoskeletal: Strength & Muscle Tone: within normal limits Gait & Station: normal Patient leans: N/A  Psychiatric Specialty Exam:  Presentation  General Appearance:  Casual; Fairly Groomed  Eye Contact: Good  Speech: Clear and Coherent; Normal Rate  Speech Volume: Normal  Handedness: Right   Mood and Affect  Mood: Depressed  Affect: Congruent   Thought Process  Thought Processes: Coherent  Descriptions of Associations:Intact  Orientation:Full (Time, Place and Person)  Thought Content:Logical  History of Schizophrenia/Schizoaffective disorder:No  Duration of Psychotic Symptoms:No data recorded Hallucinations:No data recorded  Ideas of Reference:No data recorded Suicidal Thoughts:No data recorded  Homicidal Thoughts:No data recorded   Sensorium  Memory: Immediate Good; Recent Good; Remote Good  Judgment: Poor  Insight: Fair   Art therapist  Concentration: Good  Attention Span: Good  Recall: Good  Fund of Knowledge: Fair  Language: Good   Psychomotor Activity  Psychomotor Activity: No data recorded  Assets  Assets: Communication Skills; Desire for Improvement; Housing; Resilience; Social Support  Sleep  Sleep: No data recorded  Physical Exam: Physical Exam Vitals and nursing note reviewed.  HENT:     Head: Normocephalic.     Nose: Nose  normal.     Mouth/Throat:     Pharynx: Oropharynx is clear.  Cardiovascular:     Rate and Rhythm: Normal rate.     Pulses: Normal pulses.  Pulmonary:     Effort: Pulmonary effort is normal.  Genitourinary:    Comments: Deferred Musculoskeletal:        General: Normal range of motion.     Cervical back: Normal range of motion.  Skin:    General: Skin is dry.  Neurological:     General: No focal deficit present.     Mental Status: She is alert and oriented to person, place, and time.    Review of Systems  Constitutional:  Negative for chills, diaphoresis and fever.  HENT:  Negative for congestion and sore throat.   Eyes:  Negative for blurred vision.  Respiratory:  Negative for cough, shortness of breath and wheezing.   Cardiovascular:  Negative for chest pain and palpitations.  Gastrointestinal:  Negative for abdominal pain, constipation, diarrhea, heartburn, nausea and vomiting.  Genitourinary:  Negative for dysuria.  Musculoskeletal:  Negative for joint pain and myalgias.  Neurological:  Negative for dizziness, tingling, tremors, sensory change, speech change, focal weakness, seizures, loss of consciousness, weakness and headaches.  Endo/Heme/Allergies:        Allergies: PCN  Psychiatric/Behavioral:  Positive for depression and substance abuse (Alcohol use/abus.). Negative for hallucinations, memory loss and suicidal ideas. The patient is nervous/anxious. The patient does not have insomnia.    Blood pressure 123/74, pulse (!) 111, temperature 98.5 F (36.9 C), temperature source Oral, resp. rate 16, height 5\' 6"  (1.676 m), weight 111.1 kg, last menstrual period 02/15/2024, SpO2 100%. Body mass index is 39.54 kg/m.  Treatment Plan Summary: Daily contact with patient to assess and evaluate symptoms and progress in treatment and Medication management.   Principal/active diagnoses. Major depressive disorder, recurrent episodes. Generalized anxiety disorder.  Plan: The  risks/benefits/side-effects/alternatives to the medications in use were discussed in detail with the patient and time was given for patient's questions. The patient consents to medication trial.    -Discontinued Lexapro  10 mg (Pt misused).  -Decreased Fluoxetine  from 40 mg to 20 mg po daily.  -Initiated Trazodone  50 mg po q hs for insomnia.  -Initiated Hydroxyzine  25 mg po tid prn for anxiety. -Continue Nicorette  gum 2 mg prn for nicotine  withdrawal.   Other medical issues. -Continue Lisinopril  20 mg po daily for HTN.  -Continue Metformin  1,000 mg po bid for diabetes.  -Continue SEMGLEE  10 units SubQ at bedtime for DM.  -Continue the CBG/siding insulin  coverage as recommended.  -Continue the insulin  meal coverage as recommended.   Agitation. -Continue the agitation protocols as recommended.   Other PRNS -Continue Tylenol  650 mg every 6 hours PRN for mild pain -Continue Maalox 30 ml Q 4 hrs PRN for indigestion -Continue MOM 30 ml po Q 6 hrs for constipation   Safety and Monitoring: Voluntary admission to inpatient psychiatric unit for safety, stabilization and treatment Daily contact with patient to assess and evaluate symptoms and progress in treatment Patient's case to be discussed in multi-disciplinary team meeting Observation Level : q15 minute checks Vital signs: q12 hours Precautions: Safety   Discharge Planning: Social work and case management to assist with discharge planning and identification of hospital follow-up needs prior to discharge Estimated LOS: 5-7 days Discharge Concerns: Need to establish a safety plan; Medication compliance and effectiveness Discharge Goals: Return home with outpatient referrals for mental health follow-up including medication management/psychotherapy  Asuncion Layer, NP, pmhnp, [id number]. 03/12/2024, 3:25 PM

## 2024-03-11 NOTE — Progress Notes (Signed)
  Adult Psychoeducational Group Note  Date:  03/11/2024 Time:  8:22 PM  Group Topic/Focus:  Wrap-Up Group:   The focus of this group is to help patients review their daily goal of treatment and discuss progress on daily workbooks.  Participation Level:  Active  Participation Quality:  Appropriate  Affect:  Appropriate  Cognitive:  Appropriate  Insight: Appropriate  Engagement in Group:  Engaged  Modes of Intervention:  Discussion  Additional Comments:  Attend wrap up group AA.  Brutus Caprice Long 03/11/2024, 8:22 PM

## 2024-03-11 NOTE — Progress Notes (Signed)
   03/11/24 0920  Psych Admission Type (Psych Patients Only)  Admission Status Voluntary  Psychosocial Assessment  Patient Complaints Anxiety;Depression  Eye Contact Fair  Facial Expression Animated  Affect Appropriate to circumstance  Speech Logical/coherent  Interaction Assertive  Motor Activity Other (Comment) (WNL)  Appearance/Hygiene Unremarkable  Behavior Characteristics Cooperative  Mood Pleasant;Anxious  Thought Process  Coherency WDL  Content WDL  Delusions None reported or observed  Perception WDL  Hallucination None reported or observed  Judgment Limited  Confusion None  Danger to Self  Current suicidal ideation? Denies  Danger to Others  Danger to Others None reported or observed

## 2024-03-11 NOTE — BH IP Treatment Plan (Signed)
 Interdisciplinary Treatment and Diagnostic Plan Update  03/11/2024 Time of Session: 10:05 AM Tammy Hayes MRN: [id number]  Principal Diagnosis: MDD (major depressive disorder), recurrent severe, without psychosis (HCC)  Secondary Diagnoses: Principal Problem:   MDD (major depressive disorder), recurrent severe, without psychosis (HCC) Active Problems:   MDD (major depressive disorder)   Generalized anxiety disorder   Current Medications:  Current Facility-Administered Medications  Medication Dose Route Frequency Provider Last Rate Last Admin   acetaminophen  (TYLENOL ) tablet 650 mg  650 mg Oral Q6H PRN Weber, Kyra A, NP       alum & mag hydroxide-simeth (MAALOX/MYLANTA) 200-200-20 MG/5ML suspension 30 mL  30 mL Oral Q4H PRN Weber, Kyra A, NP       haloperidol (HALDOL) tablet 5 mg  5 mg Oral TID PRN Weber, Kyra A, NP       And   diphenhydrAMINE (BENADRYL) capsule 50 mg  50 mg Oral TID PRN Weber, Kyra A, NP       haloperidol lactate (HALDOL) injection 5 mg  5 mg Intramuscular TID PRN Weber, Kyra A, NP       And   diphenhydrAMINE (BENADRYL) injection 50 mg  50 mg Intramuscular TID PRN Weber, Kyra A, NP       And   LORazepam (ATIVAN) injection 2 mg  2 mg Intramuscular TID PRN Weber, Kyra A, NP       haloperidol lactate (HALDOL) injection 10 mg  10 mg Intramuscular TID PRN Weber, Kyra A, NP       And   diphenhydrAMINE (BENADRYL) injection 50 mg  50 mg Intramuscular TID PRN Weber, Kyra A, NP       And   LORazepam (ATIVAN) injection 2 mg  2 mg Intramuscular TID PRN Weber, Kyra A, NP       FLUoxetine  (PROZAC ) capsule 20 mg  20 mg Oral Daily Nwoko, Agnes I, NP   20 mg at 03/11/24 0816   hydrOXYzine  (ATARAX ) tablet 25 mg  25 mg Oral TID PRN Asuncion Layer I, NP   25 mg at 03/10/24 2139   ibuprofen  (ADVIL ) tablet 600 mg  600 mg Oral Q8H PRN Weber, Kyra A, NP       insulin  aspart (novoLOG ) injection 0-20 Units  0-20 Units Subcutaneous TID WC Nwoko, Agnes I, NP   3 Units at 03/11/24 0102   insulin   aspart (novoLOG ) injection 4 Units  4 Units Subcutaneous TID WC Asuncion Layer I, NP   4 Units at 03/11/24 7253   insulin  glargine-yfgn (SEMGLEE ) injection 10 Units  10 Units Subcutaneous QHS Weber, Kyra A, NP   10 Units at 03/10/24 2138   lisinopril (ZESTRIL) tablet 20 mg  20 mg Oral Daily Weber, Kyra A, NP   20 mg at 03/11/24 0816   magnesium  hydroxide (MILK OF MAGNESIA) suspension 30 mL  30 mL Oral Daily PRN Weber, Kyra A, NP       metFORMIN  (GLUCOPHAGE ) tablet 1,000 mg  1,000 mg Oral BID WC Weber, Kyra A, NP   1,000 mg at 03/11/24 0816   nicotine polacrilex (NICORETTE) gum 2 mg  2 mg Oral PRN Linnie Riches, Nadir, MD       ondansetron  (ZOFRAN ) tablet 4 mg  4 mg Oral Q8H PRN Weber, Kyra A, NP       traZODone  (DESYREL ) tablet 50 mg  50 mg Oral QHS Nwoko, Agnes I, NP   50 mg at 03/10/24 2135   PTA Medications: Medications Prior to Admission  Medication Sig Dispense Refill  Last Dose/Taking   escitalopram (LEXAPRO) 10 MG tablet Take 1 tablet by mouth daily.   03/10/2024   blood glucose meter kit and supplies Dispense based on patient and insurance preference. Use up to four times daily as directed. (FOR ICD-10 E10.9, E11.9). 1 each 0    Continuous Glucose Receiver (FREESTYLE LIBRE 3 READER) DEVI USE AS DIRECTED 1 each 0    Continuous Glucose Sensor (FREESTYLE LIBRE 3 SENSOR) MISC Place 1 sensor on the skin every 14 days. Use to check glucose continuously 2 each 3    etonogestrel (NEXPLANON) 68 MG IMPL implant 1 each by Subdermal route once.      FLUoxetine  (PROZAC ) 40 MG capsule Take 40 mg by mouth daily.      Insulin  Pen Needle (PEN NEEDLES) 32G X 5 MM MISC USE ONCE DAILY WITH LANTUS  PEN 100 each 0    LANTUS  SOLOSTAR 100 UNIT/ML Solostar Pen Inject 10 Units into the skin at bedtime.      lisinopril (ZESTRIL) 20 MG tablet Take 1 tablet by mouth daily.      metFORMIN  (GLUCOPHAGE ) 1000 MG tablet Take 1,000 mg by mouth 2 (two) times daily with a meal.      metFORMIN  (GLUCOPHAGE ) 500 MG tablet Take 1 tablet  (500 mg total) by mouth 2 (two) times daily with a meal. (Patient not taking: Reported on 03/09/2024) 60 tablet 3    nicotine polacrilex (NICORETTE) 2 MG gum Use as directed 2 mg in the mouth or throat as needed.      Vitamin D, Ergocalciferol, (DRISDOL) 1.25 MG (50000 UNIT) CAPS capsule Take 50,000 Units by mouth once a week.       Patient Stressors: Financial difficulties   Marital or family conflict    Patient Strengths: Automotive engineer for treatment/growth   Treatment Modalities: Medication Management, Group therapy, Case management,  1 to 1 session with clinician, Psychoeducation, Recreational therapy.   Physician Treatment Plan for Primary Diagnosis: MDD (major depressive disorder), recurrent severe, without psychosis (HCC) Long Term Goal(s): Improvement in symptoms so as ready for discharge   Short Term Goals: Ability to identify and develop effective coping behaviors will improve Ability to maintain clinical measurements within normal limits will improve Compliance with prescribed medications will improve Ability to identify triggers associated with substance abuse/mental health issues will improve Ability to identify changes in lifestyle to reduce recurrence of condition will improve Ability to verbalize feelings will improve Ability to disclose and discuss suicidal ideas Ability to demonstrate self-control will improve  Medication Management: Evaluate patient's response, side effects, and tolerance of medication regimen.  Therapeutic Interventions: 1 to 1 sessions, Unit Group sessions and Medication administration.  Evaluation of Outcomes: Not Progressing  Physician Treatment Plan for Secondary Diagnosis: Principal Problem:   MDD (major depressive disorder), recurrent severe, without psychosis (HCC) Active Problems:   MDD (major depressive disorder)   Generalized anxiety disorder  Long Term Goal(s): Improvement in symptoms so as ready for  discharge   Short Term Goals: Ability to identify and develop effective coping behaviors will improve Ability to maintain clinical measurements within normal limits will improve Compliance with prescribed medications will improve Ability to identify triggers associated with substance abuse/mental health issues will improve Ability to identify changes in lifestyle to reduce recurrence of condition will improve Ability to verbalize feelings will improve Ability to disclose and discuss suicidal ideas Ability to demonstrate self-control will improve     Medication Management: Evaluate patient's response, side effects, and tolerance  of medication regimen.  Therapeutic Interventions: 1 to 1 sessions, Unit Group sessions and Medication administration.  Evaluation of Outcomes: Not Progressing   RN Treatment Plan for Primary Diagnosis: MDD (major depressive disorder), recurrent severe, without psychosis (HCC) Long Term Goal(s): Knowledge of disease and therapeutic regimen to maintain health will improve  Short Term Goals: Ability to remain free from injury will improve, Ability to verbalize frustration and anger appropriately will improve, Ability to demonstrate self-control, Ability to participate in decision making will improve, Ability to verbalize feelings will improve, Ability to disclose and discuss suicidal ideas, Ability to identify and develop effective coping behaviors will improve, and Compliance with prescribed medications will improve  Medication Management: RN will administer medications as ordered by provider, will assess and evaluate patient's response and provide education to patient for prescribed medication. RN will report any adverse and/or side effects to prescribing provider.  Therapeutic Interventions: 1 on 1 counseling sessions, Psychoeducation, Medication administration, Evaluate responses to treatment, Monitor vital signs and CBGs as ordered, Perform/monitor CIWA, COWS, AIMS  and Fall Risk screenings as ordered, Perform wound care treatments as ordered.  Evaluation of Outcomes: Not Progressing   LCSW Treatment Plan for Primary Diagnosis: MDD (major depressive disorder), recurrent severe, without psychosis (HCC) Long Term Goal(s): Safe transition to appropriate next level of care at discharge, Engage patient in therapeutic group addressing interpersonal concerns.  Short Term Goals: Engage patient in aftercare planning with referrals and resources, Increase social support, Increase ability to appropriately verbalize feelings, Increase emotional regulation, Facilitate acceptance of mental health diagnosis and concerns, Facilitate patient progression through stages of change regarding substance use diagnoses and concerns, Identify triggers associated with mental health/substance abuse issues, and Increase skills for wellness and recovery  Therapeutic Interventions: Assess for all discharge needs, 1 to 1 time with Social worker, Explore available resources and support systems, Assess for adequacy in community support network, Educate family and significant other(s) on suicide prevention, Complete Psychosocial Assessment, Interpersonal group therapy.  Evaluation of Outcomes: Not Progressing   Progress in Treatment: Attending groups: Yes. Participating in groups: Yes. Taking medication as prescribed: Yes. Toleration medication: Yes. Family/Significant other contact made:  attempted to contact Teauna Pridgeon (aunt) [phone] Patient understands diagnosis: Yes. Discussing patient identified problems/goals with staff: Yes. Medical problems stabilized or resolved: Yes. Denies suicidal/homicidal ideation: Yes. Issues/concerns per patient self-inventory: No.  New problem(s) identified:  No  New Short Term/Long Term Goal(s):     medication stabilization, elimination of SI thoughts, development of comprehensive mental wellness plan.   Patient Goals:  "I want to stabilize  my mood and express my feelings more openly."  Discharge Plan or Barriers:  Patient recently admitted. CSW will continue to follow and assess for appropriate referrals and possible discharge planning.    Reason for Continuation of Hospitalization: Anxiety Depression Medication stabilization Suicidal ideation  Estimated Length of Stay:  5 - 7 days  Last 3 [place] Suicide Severity Risk Score: Flowsheet Row Ad[facility] (Current) [facility] from 03[facility]08/2024 in [facility] ED to Hosp-Admission (Discharged) from 01/31/2024 in [facility]  C-SSRS RISK CATEGORY High Risk High Risk High Risk       Last PHQ 2/9 Scores:    02/16/2024   10:30 AM 02/16/2024    9:00 AM 05/26/2022   10:35 AM  Depression screen PHQ 2/9  Decreased Interest 1 1 2   Down, Depressed, Hopeless 1 1 1  PHQ - 2 Score 2 2 3   Altered sleeping 1 1 3   Tired, decreased energy 2 1 2   Change in appetite 2 0 1  Feeling bad or failure about yourself  1 0 2  Trouble concentrating 1 1 1   Moving slowly or fidgety/restless 1 0 1  Suicidal thoughts 1 0 1  PHQ-9 Score 11 5 14   Difficult doing work/chores Somewhat difficult  Very difficult    Scribe for Treatment Team: Ashlley Booher O Shristi Scheib, LCSWA 03/11/2024 12:07 PM

## 2024-03-11 NOTE — BHH Group Notes (Signed)
 The focus of this group is to help patients establish daily goals to achieve during treatment and discuss how the patient can incorporate goal setting into their daily lives to aide in recovery.      Scale 1-10  7    Goal: working on goal

## 2024-03-11 NOTE — Plan of Care (Signed)
 ?  Problem: Activity: ?Goal: Interest or engagement in activities will improve ?Outcome: Progressing ?Goal: Sleeping patterns will improve ?Outcome: Progressing ?  ?Problem: Coping: ?Goal: Ability to verbalize frustrations and anger appropriately will improve ?Outcome: Progressing ?Goal: Ability to demonstrate self-control will improve ?Outcome: Progressing ?  ?Problem: Safety: ?Goal: Periods of time without injury will increase ?Outcome: Progressing ?  ?

## 2024-03-12 DIAGNOSIS — F332 Major depressive disorder, recurrent severe without psychotic features: Secondary | ICD-10-CM | POA: Diagnosis not present

## 2024-03-12 LAB — GLUCOSE, CAPILLARY
Glucose-Capillary: 150 mg/dL — ABNORMAL HIGH (ref 70–99)
Glucose-Capillary: 204 mg/dL — ABNORMAL HIGH (ref 70–99)
Glucose-Capillary: 104 mg/dL — ABNORMAL HIGH (ref 70–99)
Glucose-Capillary: 150 mg/dL — ABNORMAL HIGH (ref 70–99)

## 2024-03-12 NOTE — Group Note (Signed)
 LCSW Group Therapy Note   Type of Therapy and Topic:  Group Therapy - Safety  Participation Level:  Active   Description of Group This process group involved patients discussing the situations or people in their lives that frequently make them safe or unsafe.  Anxiety was a common factor among all group participants and many of them described home situations that keep them on edge and not able to feel completely safe.  Three questions were addressed during the group:  (1) What makes you feel safe (or unsafe)?  (2) Do you feel safe with yourself and why?  (3) If you don't feel safe, what can you do?  A lengthy discussion ensued in which group members empathized with each other, gave suggestions to one another, and expressed their feelings freely.  Therapeutic Goals Patient will describe what makes them feel safe or unsafe in their everyday lives. Patient will think about and discuss whether they feel safe with themselves and what reasons might contribute to feeling safe or unsafe. Patients will participate in planning for what can be done to help themselves feel safer.   Summary of Patient Progress:  The patient was present and engaged. The patient share that her self worth is to believe in herself. The patient stated that her safety is being here. The patient state "my safe place is with family with people Love me and have the same mindset as me.   Therapeutic Modalities Cognitive Behavioral Therapy  Zayveon Raschke O Ishia Tenorio, LCSWA 03/12/2024  3:59 PM

## 2024-03-12 NOTE — Group Note (Signed)
 Date:  03/12/2024 Time:  10:38 AM  Group Topic/Focus:  Goals Group:   The focus of this group is to help patients establish daily goals to achieve during treatment and discuss how the patient can incorporate goal setting into their daily lives to aide in recovery. Orientation:   The focus of this group is to educate the patient on the purpose and policies of crisis stabilization and provide a format to answer questions about their admission.  The group details unit policies and expectations of patients while admitted.    Participation Level:  Did Not Attend  Park Bolk [facility] 03/12/2024, 10:38 AM

## 2024-03-12 NOTE — Plan of Care (Signed)
  Problem: Education: Goal: Knowledge of [facility] Education information/materials will improve Outcome: Completed/Met Goal: Emotional status will improve Outcome: Progressing Goal: Mental status will improve Outcome: Progressing   Problem: Coping: Goal: Ability to demonstrate self-control will improve Outcome: Progressing   Problem: Health Behavior/Discharge Planning: Goal: Identification of resources available to assist in meeting health care needs will improve Outcome: Progressing

## 2024-03-12 NOTE — Progress Notes (Signed)
   03/12/24 0730  Psych Admission Type (Psych Patients Only)  Admission Status Voluntary  Psychosocial Assessment  Patient Complaints Anxiety;Depression  Eye Contact Fair  Facial Expression Animated  Affect Appropriate to circumstance  Speech Logical/coherent  Interaction Assertive  Motor Activity Other (Comment) (WNL)  Appearance/Hygiene Unremarkable  Behavior Characteristics Cooperative;Appropriate to situation  Mood Pleasant  Thought Process  Coherency WDL  Content WDL  Delusions None reported or observed  Perception WDL  Hallucination None reported or observed  Judgment Limited  Confusion None  Danger to Self  Current suicidal ideation? Denies  Danger to Others  Danger to Others None reported or observed

## 2024-03-12 NOTE — Progress Notes (Signed)
   03/11/24 2252  Psych Admission Type (Psych Patients Only)  Admission Status Voluntary  Psychosocial Assessment  Patient Complaints Anxiety;Depression  Eye Contact Fair  Facial Expression Animated  Affect Appropriate to circumstance  Speech Logical/coherent  Interaction Assertive  Motor Activity Other (Comment) (WDL)  Appearance/Hygiene Unremarkable  Behavior Characteristics Cooperative;Appropriate to situation  Mood Pleasant;Anxious  Thought Process  Coherency WDL  Content WDL  Delusions None reported or observed  Perception WDL  Hallucination None reported or observed  Judgment Limited  Confusion None  Danger to Self  Current suicidal ideation? Denies  Danger to Others  Danger to Others None reported or observed

## 2024-03-12 NOTE — Group Note (Signed)
 Date:  03/12/2024 Time:  11:15 AM  Group Topic/Focus:  Emotional Education:   The focus of this group is to discuss what feelings/emotions are, and how they are experienced.    Participation Level:  Did Not Attend  Park Bolk [facility] 03/12/2024, 11:15 AM

## 2024-03-12 NOTE — Group Note (Deleted)
 Date:  03/12/2024 Time:  10:43 AM  Group Topic/Focus:  Goals Group:   The focus of this group is to help patients establish daily goals to achieve during treatment and discuss how the patient can incorporate goal setting into their daily lives to aide in recovery. Orientation:   The focus of this group is to educate the patient on the purpose and policies of crisis stabilization and provide a format to answer questions about their admission.  The group details unit policies and expectations of patients while admitted.     Participation Level:  {BHH PARTICIPATION ZOXWR:60454}  Participation Quality:  {BHH PARTICIPATION QUALITY:22265}  Affect:  {BHH AFFECT:22266}  Cognitive:  {BHH COGNITIVE:22267}  Insight: {BHH Insight2:20797}  Engagement in Group:  {BHH ENGAGEMENT IN UJWJX:91478}  Modes of Intervention:  {BHH MODES OF INTERVENTION:22269}  Additional Comments:  ***  Park Bolk [address] [facility] 03/12/2024, 10:43 AM

## 2024-03-12 NOTE — BHH Group Notes (Signed)
 BHH Group Notes:  (Nursing/MHT/Case Management/Adjunct)  Date:  03/12/2024  Time:  2000  Type of Therapy:  Wrap up group  Participation Level:  Active  Participation Quality:  Appropriate, Attentive, Sharing, and Supportive  Affect:  Appropriate  Cognitive:  Alert  Insight:  Improving  Engagement in Group:  Engaged  Modes of Intervention:  Clarification, Education, and Support  Summary of Progress/Problems: Positive thinking and positive change were discussed.   Tammy Hayes 03/12/2024, 9:31 PM

## 2024-03-13 DIAGNOSIS — F332 Major depressive disorder, recurrent severe without psychotic features: Secondary | ICD-10-CM | POA: Diagnosis not present

## 2024-03-13 LAB — GLUCOSE, CAPILLARY
Glucose-Capillary: 131 mg/dL — ABNORMAL HIGH (ref 70–99)
Glucose-Capillary: 131 mg/dL — ABNORMAL HIGH (ref 70–99)
Glucose-Capillary: 119 mg/dL — ABNORMAL HIGH (ref 70–99)
Glucose-Capillary: 152 mg/dL — ABNORMAL HIGH (ref 70–99)

## 2024-03-13 NOTE — Progress Notes (Signed)
 [facility] MD Progress Note  03/13/2024 2:55 PM  Tammy Hayes   MRN:  [id number]  Reason for admission: 28 year old AA female with prior hx of mental illness (depression), previous psychiatric admissions, treatments & an outpatient psychiatric care with. Admitted to the [facility] from the [facility] with complaint of suicide attempt by overdose on 3 tablets of Lexapro  10 mg. Hayes review of his lab results has shown that his BAL is 151 & hgba1c is 12.8. Patient was taken to the [facility] with complaints of suicide attempt by overdose on Lexapro  10 mg tabs (15 tablets per chart review) however, only (3 tablets) per patient's reports. Labs results reviewed, HGBa1c 12.8 & BAL 151. Her Liver enzymes are within norm.   Daily notes: Tammy Hayes is seen this morning. She was lying down after breakfast. She says her mood is good. She complained that she is having Hayes headache. She is encouraged to go tell her nurse so that her RN will give her some medicine for headache. She says "okay". She says she is sleeping well. Doing well on her medicines. Denies any side effects. She denies any SIHI, AVH, denies any tactile hallucinations & or nightmares. She appears be doing Hayes lot better overall than when she was admitted to the hospital. She does not appear to be responding to any internal stimuli. She is tolerating her Abilify . There are no diabetes distress reported by staff. Vital signs remain stable. There are no new lab results.  Continue current plan of care as already in progress. Patient may be approaching her baseline in the next two days or so.  Principal Problem: MDD (major depressive disorder), recurrent severe, without psychosis (HCC) Diagnosis: Principal Problem:   MDD (major depressive disorder), recurrent severe, without psychosis (HCC) Active Problems:   MDD (major depressive disorder)   Generalized anxiety disorder  Total Time spent with patient: 45 minutes  Past Psychiatric History: See H&P.  Past  Medical History:  Past Medical History:  Diagnosis Date   Bilateral ovarian cysts    Depression    Pediatric overweight 07/13/2013   Pre-diabetes     Past Surgical History:  Procedure Laterality Date   EYE MUSCLE SURGERY     in 3rd grade. No reported residual weakness.    Family History:  Family History  Problem Relation Age of Onset   Depression Mother        grandparents, aunts/uncles.    Heart disease Other        aunts/uncles   Hypertension Other        aunts/uncles, grandparents   Kidney disease Other        aunts/uncles, grandparents   Stroke Other        aunts/uncles   Cancer Neg Hx    Family Psychiatric  History: See H&P.  Social History:  Social History   Substance and Sexual Activity  Alcohol Use Not Currently     Social History   Substance and Sexual Activity  Drug Use Yes   Types: Marijuana   Comment: In the past used marijuana. None in about one year.    Social History   Socioeconomic History   Marital status: Single    Spouse name: Not on file   Number of children: Not on file   Years of education: Not on file   Highest education level: Not on file  Occupational History   Not on file  Tobacco Use   Smoking status: Never   Smokeless tobacco:  Never  Vaping Use   Vaping status: Some Days  Substance and Sexual Activity   Alcohol use: Not Currently   Drug use: Yes    Types: Marijuana    Comment: In the past used marijuana. None in about one year.   Sexual activity: Yes    Partners: Male    Birth control/protection: None  Other Topics Concern   Not on file  Social History Narrative   Lives with father 60% of the time. Student at [employer] finished 7th grade.    Christian per father.    Social Drivers of Corporate investment banker Strain: Not on file  Food Insecurity: Food Insecurity Present (03/09/2024)   Hunger Vital Sign    Worried About Running Out of Food in the Last Year: Sometimes true    Ran Out of Food in  the Last Year: Sometimes true  Transportation Needs: Unmet Transportation Needs (03/09/2024)   PRAPARE - Administrator, Civil Service (Medical): Yes    Lack of Transportation (Non-Medical): Yes  Physical Activity: Not on file  Stress: Not on file  Social Connections: Socially Isolated (01/31/2024)   Social Connection and Isolation Panel [NHANES]    Frequency of Communication with Friends and Family: Once Hayes week    Frequency of Social Gatherings with Friends and Family: Once Hayes week    Attends Religious Services: More than 4 times per year    Active Member of [employer] or Organizations: No    Attends Engineer, structural: Never    Marital Status: Never married   Additional Social History:   Sleep: Good  Appetite:  Good  Current Medications: Current Facility-Administered Medications  Medication Dose Route Frequency Provider Last Rate Last Admin   acetaminophen  (TYLENOL ) tablet 650 mg  650 mg Oral Q6H PRN Tammy Hayes, Tammy A, NP       alum & mag hydroxide-simeth (MAALOX/MYLANTA) 200-200-20 MG/5ML suspension 30 mL  30 mL Oral Q4H PRN Tammy Hayes, Tammy A, NP       ARIPiprazole  (ABILIFY ) tablet 5 mg  5 mg Oral Daily Tammy Hayes, Tammy Fontana I, NP   5 mg at 03/13/24 5284   haloperidol  (HALDOL ) tablet 5 mg  5 mg Oral TID PRN Tammy Hayes, Tammy A, NP       And   diphenhydrAMINE  (BENADRYL ) capsule 50 mg  50 mg Oral TID PRN Tammy Hayes, Tammy A, NP       haloperidol  lactate (HALDOL ) injection 5 mg  5 mg Intramuscular TID PRN Tammy Hayes, Tammy A, NP       And   diphenhydrAMINE  (BENADRYL ) injection 50 mg  50 mg Intramuscular TID PRN Tammy Hayes, Tammy A, NP       And   LORazepam  (ATIVAN ) injection 2 mg  2 mg Intramuscular TID PRN Tammy Hayes, Tammy A, NP       haloperidol  lactate (HALDOL ) injection 10 mg  10 mg Intramuscular TID PRN Tammy Hayes, Tammy A, NP       And   diphenhydrAMINE  (BENADRYL ) injection 50 mg  50 mg Intramuscular TID PRN Tammy Hayes, Tammy A, NP       And   LORazepam  (ATIVAN ) injection 2 mg  2 mg Intramuscular TID PRN Tammy Hayes,  Tammy A, NP       FLUoxetine  (PROZAC ) capsule 20 mg  20 mg Oral Daily Tammy Blash I, NP   20 mg at 03/13/24 0736   hydrOXYzine  (ATARAX ) tablet 25 mg  25 mg Oral TID PRN Asuncion Layer I, NP   25 mg at  03/13/24 1039   ibuprofen  (ADVIL ) tablet 600 mg  600 mg Oral Q8H PRN Tammy Hayes, Tammy A, NP   600 mg at 03/13/24 1039   insulin  aspart (novoLOG ) injection 0-20 Units  0-20 Units Subcutaneous TID WC Asuncion Layer I, NP   7 Units at 03/13/24 1206   insulin  aspart (novoLOG ) injection 4 Units  4 Units Subcutaneous TID WC Asuncion Layer I, NP   4 Units at 03/13/24 1206   insulin  glargine-yfgn (SEMGLEE ) injection 10 Units  10 Units Subcutaneous QHS Tammy Hayes, Tammy A, NP   10 Units at 03/12/24 2130   lisinopril  (ZESTRIL ) tablet 20 mg  20 mg Oral Daily Tammy Hayes, Tammy A, NP   20 mg at 03/13/24 0736   magnesium  hydroxide (MILK OF MAGNESIA) suspension 30 mL  30 mL Oral Daily PRN Tammy Hayes, Tammy A, NP   30 mL at 03/12/24 0745   metFORMIN  (GLUCOPHAGE ) tablet 1,000 mg  1,000 mg Oral BID WC Tammy Hayes, Tammy A, NP   1,000 mg at 03/13/24 1039   nicotine  polacrilex (NICORETTE ) gum 2 mg  2 mg Oral PRN Alver Jobs, MD   2 mg at 03/12/24 1456   ondansetron  (ZOFRAN ) tablet 4 mg  4 mg Oral Q8H PRN Tammy Hayes, Tammy A, NP       traZODone  (DESYREL ) tablet 50 mg  50 mg Oral QHS Ailed Defibaugh, Tammy Fontana I, NP   50 mg at 03/12/24 2119    Lab Results:  Results for orders placed or performed during the hospital encounter of 03/09/24 (from the past 48 hours)  Glucose, capillary     Status: Abnormal   Collection Time: 03/11/24  5:13 PM  Result Value Ref Range   Glucose-Capillary 136 (H) 70 - 99 mg/dL    Comment: Glucose reference range applies only to samples taken after fasting for at least 8 hours.  Glucose, capillary     Status: Abnormal   Collection Time: 03/11/24  9:02 PM  Result Value Ref Range   Glucose-Capillary 149 (H) 70 - 99 mg/dL    Comment: Glucose reference range applies only to samples taken after fasting for at least 8 hours.   Comment 1 Notify RN     Comment 2 Document in Chart   Glucose, capillary     Status: Abnormal   Collection Time: 03/12/24  5:57 AM  Result Value Ref Range   Glucose-Capillary 150 (H) 70 - 99 mg/dL    Comment: Glucose reference range applies only to samples taken after fasting for at least 8 hours.   Comment 1 Notify RN    Comment 2 Document in Chart   Glucose, capillary     Status: Abnormal   Collection Time: 03/12/24 12:03 PM  Result Value Ref Range   Glucose-Capillary 204 (H) 70 - 99 mg/dL    Comment: Glucose reference range applies only to samples taken after fasting for at least 8 hours.  Glucose, capillary     Status: Abnormal   Collection Time: 03/12/24  5:09 PM  Result Value Ref Range   Glucose-Capillary 104 (H) 70 - 99 mg/dL    Comment: Glucose reference range applies only to samples taken after fasting for at least 8 hours.  Glucose, capillary     Status: Abnormal   Collection Time: 03/12/24  7:56 PM  Result Value Ref Range   Glucose-Capillary 150 (H) 70 - 99 mg/dL    Comment: Glucose reference range applies only to samples taken after fasting for at least 8 hours.  Glucose, capillary  Status: Abnormal   Collection Time: 03/13/24  5:56 AM  Result Value Ref Range   Glucose-Capillary 131 (H) 70 - 99 mg/dL    Comment: Glucose reference range applies only to samples taken after fasting for at least 8 hours.   Comment 1 Notify RN    Comment 2 Document in Chart   Glucose, capillary     Status: Abnormal   Collection Time: 03/13/24 12:01 PM  Result Value Ref Range   Glucose-Capillary 131 (H) 70 - 99 mg/dL    Comment: Glucose reference range applies only to samples taken after fasting for at least 8 hours.    Blood Alcohol level:  Lab Results  Component Value Date   ETH 151 (H) 03/08/2024   ETH 205 (H) 01/31/2024    Metabolic Disorder Labs: Lab Results  Component Value Date   HGBA1C 12.8 (H) 01/31/2024   MPG 320.66 01/31/2024   No results found for: "PROLACTIN" Lab Results   Component Value Date   CHOL 139 05/20/2022   TRIG 65 05/20/2022   HDL 54 05/20/2022   CHOLHDL 2.6 05/20/2022   VLDL 13 05/20/2022   LDLCALC 72 05/20/2022    Physical Findings: AIMS:  , ,  ,  ,    CIWA:    COWS:     Musculoskeletal: Strength & Muscle Tone: within normal limits Gait & Station: normal Patient leans: N/Hayes  Psychiatric Specialty Exam:  Presentation  General Appearance:  Casual; Disheveled  Eye Contact: Good  Speech: Clear and Coherent; Normal Rate  Speech Volume: Normal  Handedness: Right   Mood and Affect  Mood: -- (Improving.)  Affect: Congruent   Thought Process  Thought Processes: Coherent  Descriptions of Associations:Intact  Orientation:Full (Time, Place and Person)  Thought Content:Logical  History of Schizophrenia/Schizoaffective disorder:No  Duration of Psychotic Symptoms:No data recorded Hallucinations:Hallucinations: None   Ideas of Reference:Other (comment)  Suicidal Thoughts:Suicidal Thoughts: No   Homicidal Thoughts:Homicidal Thoughts: No    Sensorium  Memory: Immediate Good; Recent Good; Remote Good  Judgment: Fair  Insight: Fair   Art therapist  Concentration: Good  Attention Span: Good  Recall: Good  Fund of Knowledge: Fair  Language: Good   Psychomotor Activity  Psychomotor Activity: Psychomotor Activity: Normal   Assets  Assets: Communication Skills; Desire for Improvement; Housing; Social Support  Sleep  Sleep: Sleep: Good Number of Hours of Sleep: 8.5   Physical Exam: Physical Exam Vitals and nursing note reviewed.  HENT:     Head: Normocephalic.     Nose: Nose normal.     Mouth/Throat:     Pharynx: Oropharynx is clear.  Cardiovascular:     Rate and Rhythm: Normal rate.     Pulses: Normal pulses.  Pulmonary:     Effort: Pulmonary effort is normal.  Genitourinary:    Comments: Deferred Musculoskeletal:        General: Normal range of motion.      Cervical back: Normal range of motion.  Skin:    General: Skin is dry.  Neurological:     General: No focal deficit present.     Mental Status: She is alert and oriented to person, place, and time.    Review of Systems  Constitutional:  Negative for chills, diaphoresis and fever.  HENT:  Negative for congestion and sore throat.   Eyes:  Negative for blurred vision.  Respiratory:  Negative for cough, shortness of breath and wheezing.   Cardiovascular:  Negative for chest pain and palpitations.  Gastrointestinal:  Negative  for abdominal pain, constipation, diarrhea, heartburn, nausea and vomiting.  Genitourinary:  Negative for dysuria.  Musculoskeletal:  Negative for joint pain and myalgias.  Neurological:  Negative for dizziness, tingling, tremors, sensory change, speech change, focal weakness, seizures, loss of consciousness, weakness and headaches.  Endo/Heme/Allergies:        Allergies: PCN  Psychiatric/Behavioral:  Positive for depression and substance abuse (Alcohol use/abus.). Negative for hallucinations, memory loss and suicidal ideas. The patient is nervous/anxious. The patient does not have insomnia.    Blood pressure 119/77, pulse (!) 106, temperature 98.3 F (36.8 C), temperature source Oral, resp. rate 18, height 5\' 6"  (1.676 m), weight 111.1 kg, last menstrual period 02/15/2024, SpO2 100%. Body mass index is 39.54 kg/m.  Treatment Plan Summary: Daily contact with patient to assess and evaluate symptoms and progress in treatment and Medication management.   Principal/active diagnoses. Major depressive disorder, recurrent episodes. Generalized anxiety disorder.  Plan: The risks/benefits/side-effects/alternatives to the medications in use were discussed in detail with the patient and time was given for patient's questions. The patient consents to medication trial.    -Discontinued Lexapro  10 mg (Pt misused).  -Continue Fluoxetine  20 mg po daily.  -Continue Trazodone  50  mg po q hs for insomnia.  -Continue Hydroxyzine  25 mg po tid prn for anxiety. -Continue Nicorette  gum 2 mg prn for nicotine  withdrawal.  -Continue Abilify  5 mg po daily as an adjunct to Prozac .   Other medical issues. -Continue Lisinopril  20 mg po daily for HTN.  -Continue Metformin  1,000 mg po bid for diabetes.  -Continue SEMGLEE  10 units SubQ at bedtime for DM.  -Continue the CBG/siding insulin  coverage as recommended.  -Continue the insulin  meal coverage as recommended.   Agitation. -Continue the agitation protocols as recommended.   Other PRNS -Continue Tylenol  650 mg every 6 hours PRN for mild pain -Continue Maalox 30 ml Q 4 hrs PRN for indigestion -Continue MOM 30 ml po Q 6 hrs for constipation   Safety and Monitoring: Voluntary admission to inpatient psychiatric unit for safety, stabilization and treatment Daily contact with patient to assess and evaluate symptoms and progress in treatment Patient's case to be discussed in multi-disciplinary team meeting Observation Level : q15 minute checks Vital signs: q12 hours Precautions: Safety   Discharge Planning: Social work and case management to assist with discharge planning and identification of hospital follow-up needs prior to discharge Estimated LOS: 5-7 days Discharge Concerns: Need to establish Hayes safety plan; Medication compliance and effectiveness Discharge Goals: Return home with outpatient referrals for mental health follow-up including medication management/psychotherapy  Asuncion Layer, NP, pmhnp, fnp-bc. 03/13/2024, 2:55 PM Patient ID: Tammy Hayes, female   DOB: [birth year], 28 y.o.   MRN: [id number]

## 2024-03-13 NOTE — Progress Notes (Signed)
   03/13/24 0900  Psych Admission Type (Psych Patients Only)  Admission Status Voluntary  Psychosocial Assessment  Patient Complaints Anxiety  Eye Contact Fair  Facial Expression Animated  Affect Appropriate to circumstance  Speech Logical/coherent  Interaction Assertive  Motor Activity Other (Comment) (WNL)  Appearance/Hygiene Unremarkable  Behavior Characteristics Cooperative  Mood Pleasant  Thought Process  Coherency WDL  Content WDL  Delusions None reported or observed  Perception WDL  Hallucination None reported or observed  Judgment Limited  Confusion None  Danger to Self  Current suicidal ideation? Denies  Agreement Not to Harm Self Yes  Description of Agreement verbal  Danger to Others  Danger to Others None reported or observed

## 2024-03-13 NOTE — Group Note (Signed)
 Date:  03/14/2024 Time:  12:35 AM  Group Topic/Focus:  Wrap-Up Group:   The focus of this group is to help patients review their daily goal of treatment and discuss progress on daily workbooks.    Participation Level:  Minimal  Participation Quality:  Attentive  Affect:  Appropriate  Cognitive:  Appropriate  Insight: Lacking  Engagement in Group:  Lacking  Modes of Intervention:  Discussion Patient was present during group but did not participate.. Additional Comments:    Tammy Hayes 03/14/2024, 12:35 AM

## 2024-03-13 NOTE — Group Note (Signed)
 Date:  03/13/2024 Time:  9:18 AM  Group Topic/Focus:  Goals Group:   The focus of this group is to help patients establish daily goals to achieve during treatment and discuss how the patient can incorporate goal setting into their daily lives to aide in recovery. Orientation:   The focus of this group is to educate the patient on the purpose and policies of crisis stabilization and provide a format to answer questions about their admission.  The group details unit policies and expectations of patients while admitted.    Participation Level:  Did Not Attend

## 2024-03-13 NOTE — Plan of Care (Signed)
   Problem: Education: Goal: Emotional status will improve Outcome: Progressing Goal: Verbalization of understanding the information provided will improve Outcome: Progressing

## 2024-03-13 NOTE — Plan of Care (Signed)
  Problem: Education: Goal: Emotional status will improve Outcome: Progressing   Problem: Activity: Goal: Interest or engagement in activities will improve Outcome: Progressing   Problem: Health Behavior/Discharge Planning: Goal: Identification of resources available to assist in meeting health care needs will improve Outcome: Progressing   Problem: Safety: Goal: Periods of time without injury will increase Outcome: Progressing

## 2024-03-13 NOTE — Progress Notes (Signed)
   03/12/24 2015  Psych Admission Type (Psych Patients Only)  Admission Status Voluntary  Psychosocial Assessment  Patient Complaints Anxiety;Depression  Eye Contact Fair  Facial Expression Animated  Affect Appropriate to circumstance  Speech Logical/coherent  Interaction Assertive  Motor Activity Other (Comment) (WNL)  Appearance/Hygiene Unremarkable  Behavior Characteristics Cooperative;Appropriate to situation  Mood Pleasant  Thought Process  Coherency WDL  Content WDL  Delusions None reported or observed  Perception WDL  Hallucination None reported or observed  Judgment Limited  Confusion None  Danger to Self  Current suicidal ideation? Denies  Agreement Not to Harm Self Yes  Description of Agreement Verbal Contract  Danger to Others  Danger to Others None reported or observed

## 2024-03-13 NOTE — [facility] Suicide Risk Assessment (Signed)
 [facility] INPATIENT:  Family/Significant Other Suicide Prevention Education   Suicide Prevention Education:  Contact Attempts: Lindy Garczynski, aunt, [phone],  has been identified by the patient as the family member/significant other with whom the patient will be residing, and identified as the person(s) who will aid the patient in the event of a mental health crisis.  With written consent from the patient, two attempts were made to provide suicide prevention education, prior to and/or following the patient's discharge.  We were unsuccessful in providing suicide prevention education.  A suicide education pamphlet was given to the patient to share with family/significant other.   Date and time of second attempt: 03/13/24 1515   Elspeth Hals 03/13/2024, 3:15 PM

## 2024-03-14 LAB — GLUCOSE, CAPILLARY
Glucose-Capillary: 129 mg/dL — ABNORMAL HIGH (ref 70–99)
Glucose-Capillary: 95 mg/dL (ref 70–99)
Glucose-Capillary: 125 mg/dL — ABNORMAL HIGH (ref 70–99)
Glucose-Capillary: 162 mg/dL — ABNORMAL HIGH (ref 70–99)

## 2024-03-14 MED ORDER — ARIPIPRAZOLE 5 MG PO TABS: 5.0000 mg | ORAL_TABLET | Freq: Every day | ORAL | Status: DC

## 2024-03-14 NOTE — Progress Notes (Signed)
 Adult Psychoeducational Group Note  Date:  03/14/2024 Time:  8:31 PM  Group Topic/Focus:  Wrap-Up Group:   The focus of this group is to help patients review their daily goal of treatment and discuss progress on daily workbooks.  Participation Level:  Active  Participation Quality:  Appropriate  Affect:  Appropriate  Cognitive:  Appropriate  Insight: Appropriate  Engagement in Group:  Engaged  Modes of Intervention:  Discussion  Additional Comments:  attend wrap up group AA  Tammy Hayes 03/14/2024, 8:31 PM

## 2024-03-14 NOTE — Plan of Care (Signed)
 Attempted to call patient's aunt Tammy Hayes [phone], no response   Also attempted to call patient's grandmother Tammy Hayes at [phone] [phone], went straight to voicemail

## 2024-03-14 NOTE — Plan of Care (Signed)
   Problem: Education: Goal: Emotional status will improve Outcome: Progressing Goal: Mental status will improve Outcome: Progressing Goal: Verbalization of understanding the information provided will improve Outcome: Progressing   Problem: Activity: Goal: Interest or engagement in activities will improve Outcome: Progressing   Problem: Coping: Goal: Ability to demonstrate self-control will improve Outcome: Progressing   Problem: Safety: Goal: Periods of time without injury will increase Outcome: Progressing

## 2024-03-14 NOTE — Progress Notes (Signed)
   03/13/24 2033  Psych Admission Type (Psych Patients Only)  Admission Status Voluntary  Psychosocial Assessment  Patient Complaints Anxiety  Eye Contact Fair  Facial Expression Animated  Affect Appropriate to circumstance  Speech Logical/coherent  Interaction Assertive  Motor Activity Other (Comment) (WDL)  Appearance/Hygiene Unremarkable  Behavior Characteristics Cooperative  Mood Pleasant  Thought Process  Coherency WDL  Content WDL  Delusions None reported or observed  Perception WDL  Hallucination None reported or observed  Judgment Limited  Confusion None  Danger to Self  Current suicidal ideation? Denies  Agreement Not to Harm Self Yes  Description of Agreement verbal  Danger to Others  Danger to Others None reported or observed

## 2024-03-14 NOTE — Group Note (Signed)
 Recreation Therapy Group Note   Group Topic:Stress Management  Group Date: 03/14/2024 Start Time: 0935 End Time: 1000 Facilitators: Zuleyma Scharf-McCall, LRT,CTRS Location: [address] Dayroom   Group Topic: Stress Management   Goal Area(s) Addresses:  Patient will actively participate in stress management techniques presented during session.  Patient will successfully identify benefit of practicing stress management post d/c.   Behavioral Response: N/A  Intervention: Relaxation exercise with ambient sound and script   Activity: Guided Imagery. LRT provided education, instruction, and demonstration on practice of visualization via guided imagery. Patient was asked to participate in the technique introduced during session. LRT debriefed including topics of mindfulness, stress management and specific scenarios each patient could use these techniques. Patients were given suggestions of ways to access scripts post d/c and encouraged to explore Youtube and other apps available on smartphones, tablets, and computers.  Education:  Stress Management, Discharge Planning.   Education Outcome: Acknowledges education  Clinical Observations/Feedback: Patient actively engaged in technique introduced, expressed no concerns and demonstrated ability to practice skill independently post d/c.    Affect/Mood: N/A   Participation Level: Did not attend    Clinical Observations/Individualized Feedback:     Plan: Continue to engage patient in RT group sessions 2-3x/week.   Dickie Labarre-McCall, LRT,CTRS 03/14/2024 11:23 AM

## 2024-03-14 NOTE — Group Note (Signed)
 Therapy Group Note  Group Topic:Other  Group Date: 03/14/2024 Start Time: 1430 End Time: 1509 Facilitators: Lynnda Sas, OT    The primary objective of this topic is to explore and understand the concept of occupational balance in the context of daily living. The term "occupational balance" is defined broadly, encompassing all activities that occupy an individual's time and energy, including self-care, leisure, and work-related tasks. The goal is to guide participants towards achieving a harmonious blend of these activities, tailored to their personal values and life circumstances. This balance is aimed at enhancing overall well-being, not by equally distributing time across activities, but by ensuring that daily engagements are fulfilling and not draining. The content delves into identifying various barriers that individuals face in achieving occupational balance, such as overcommitment, misaligned priorities, external pressures, and lack of effective time management. The impact of these barriers on occupational performance, roles, and lifestyles is examined, highlighting issues like reduced efficiency, strained relationships, and potential health problems. Strategies for cultivating occupational balance are a key focus. These strategies include practical methods like time blocking, prioritizing tasks, establishing self-care rituals, decluttering, connecting with nature, and engaging in reflective practices. These approaches are designed to be adaptable and applicable to a wide range of life scenarios, promoting a proactive and mindful approach to daily living. The overall aim is to equip participants with the knowledge and tools to create a balanced lifestyle that supports their mental, emotional, and physical health, thereby improving their functional performance in daily life.     Participation Level: Engaged   Participation Quality: Independent   Behavior: Appropriate   Speech/Thought  Process: Relevant   Affect/Mood: Appropriate   Insight: Fair   Judgement: Fair      Modes of Intervention: Education  Patient Response to Interventions:  Attentive   Plan: Continue to engage patient in OT groups 2 - 3x/week.  03/14/2024  Lynnda Sas, OT  Tammy Hayes, OT

## 2024-03-14 NOTE — Progress Notes (Signed)
   03/14/24 1000  Psych Admission Type (Psych Patients Only)  Admission Status Voluntary  Psychosocial Assessment  Patient Complaints Anxiety  Eye Contact Fair  Facial Expression Animated  Affect Appropriate to circumstance  Speech Logical/coherent  Interaction Assertive  Motor Activity Other (Comment) (WNL)  Appearance/Hygiene Unremarkable  Behavior Characteristics Cooperative;Appropriate to situation  Mood Pleasant  Thought Process  Coherency WDL  Content WDL  Delusions None reported or observed  Perception WDL  Hallucination None reported or observed  Judgment Limited  Confusion None  Danger to Self  Current suicidal ideation? Denies  Agreement Not to Harm Self Yes  Description of Agreement verbal  Danger to Others  Danger to Others None reported or observed

## 2024-03-14 NOTE — [facility] Group Notes (Signed)
 Adult Psychoeducational Group Note  Date:  03/14/2024 Time:  10:14 AM  Group Topic/Focus:  Goals Group:   The focus of this group is to help patients establish daily goals to achieve during treatment and discuss how the patient can incorporate goal setting into their daily lives to aide in recovery.  Participation Level:  Minimal  Participation Quality:  Attentive  Affect:  Appropriate  Cognitive:  Appropriate  Insight: Appropriate  Engagement in Group:  Engaged  Modes of Intervention:  Exploration  Additional Comments:  Pt participated in goals group. Pt stated their goal is to decrease her over thinking. Pt identified no SI/HI.  Tammy Hayes 03/14/2024, 10:14 AM

## 2024-03-14 NOTE — Progress Notes (Signed)
 [facility] MD Progress Note  03/14/2024 7:03 AM Tammy Hayes  MRN:  [id number]  Principal Problem: MDD (major depressive disorder), recurrent severe, without psychosis (HCC) Diagnosis: Principal Problem:   MDD (major depressive disorder), recurrent severe, without psychosis (HCC) Active Problems:   MDD (major depressive disorder)   Generalized anxiety disorder  Reason for Admission:  28 year old AA female with prior hx of MDD, GAD, borderline personality disorder, alcohol use disorder, multiple previous psychiatric admissions, treatments & an outpatient psychiatric care with. Admitted to the [facility] voluntarily from the [facility] with complaint of suicide attempt by overdose on 3 tablets of Lexapro  10 mg. A review of his lab results has shown that his BAL is 151 & hgba1c is 12.8. Patient was taken to the [facility] with complaints of suicide attempt by overdose on Lexapro  10 mg tabs (15 tablets per chart review) however, only (3 tablets) per patient's reports.   (admitted on 03/09/2024, total  LOS: 5 days )  Chart review: Overnight Events Vital signs: HR 105  MAR was reviewed and patient was compliant with medications.  Patient received PRN none Labs: Glucose 129-152 Sleep: 7.25 hours Nursing notes /groups: attempted to contact aunt x2, no response   Pertinent information discussed during bed progression:  Case was discussed in the multidisciplinary team.   Information Obtained Today During Patient Interview: Patient evaluated in treatment team room. She reports her mood is good now. She reports she has talked with her sister, aunt, and grandma in [place] and she feels like communication is better with them. She reports feeling better, reports feeling like medication abilify  is helping. She reports somnolence from abilify . She asked about discharge, reports that she has learned more coping and breathing skills such as breathing in and out, taking a walk, going away from objects  that might hurt her. She reports she feels like the hospitalization was helpful. She reports the team can talk with her aunt and grandma regarding discharge planning. She denies current SI/HI/AVH.   Past Psychiatric History: Patient reports has been depressed since she was 28 years old. Has been treated at the [facility] in [place]. Says had attempted suicide x 4 by overdose on medications/self-mutilating behaviors. Has been receiving mental health care on an outpatient basis at the [facility] clinic in [place], [place].   Family Psychiatric History:  Patient reports her paternal Ardean Beat has schizophrenia.   Social History: Single, has no children, unemployed, lives with father, Lanny Plan & other family members.   Past Medical History:  Past Medical History:  Diagnosis Date   Bilateral ovarian cysts    Depression    Pediatric overweight 07/13/2013   Pre-diabetes    Family History:  Family History  Problem Relation Age of Onset   Depression Mother        grandparents, aunts/uncles.    Heart disease Other        aunts/uncles   Hypertension Other        aunts/uncles, grandparents   Kidney disease Other        aunts/uncles, grandparents   Stroke Other        aunts/uncles   Cancer Neg Hx     Current Medications: Current Facility-Administered Medications  Medication Dose Route Frequency Provider Last Rate Last Admin   acetaminophen  (TYLENOL ) tablet 650 mg  650 mg Oral Q6H PRN Weber, Kyra A, NP       alum & mag hydroxide-simeth (MAALOX/MYLANTA) 200-200-20 MG/5ML suspension 30 mL  30 mL  Oral Q4H PRN Weber, Kyra A, NP       ARIPiprazole  (ABILIFY ) tablet 5 mg  5 mg Oral Daily Nwoko, Agnes I, NP   5 mg at 03/13/24 9147   haloperidol  (HALDOL ) tablet 5 mg  5 mg Oral TID PRN Weber, Kyra A, NP       And   diphenhydrAMINE  (BENADRYL ) capsule 50 mg  50 mg Oral TID PRN Weber, Kyra A, NP       haloperidol  lactate (HALDOL ) injection 5 mg  5 mg Intramuscular TID PRN Weber, Kyra A, NP       And    diphenhydrAMINE  (BENADRYL ) injection 50 mg  50 mg Intramuscular TID PRN Weber, Kyra A, NP       And   LORazepam  (ATIVAN ) injection 2 mg  2 mg Intramuscular TID PRN Weber, Kyra A, NP       haloperidol  lactate (HALDOL ) injection 10 mg  10 mg Intramuscular TID PRN Weber, Kyra A, NP       And   diphenhydrAMINE  (BENADRYL ) injection 50 mg  50 mg Intramuscular TID PRN Weber, Kyra A, NP       And   LORazepam  (ATIVAN ) injection 2 mg  2 mg Intramuscular TID PRN Weber, Kyra A, NP       FLUoxetine  (PROZAC ) capsule 20 mg  20 mg Oral Daily Nwoko, Agnes I, NP   20 mg at 03/13/24 0736   hydrOXYzine  (ATARAX ) tablet 25 mg  25 mg Oral TID PRN Asuncion Layer I, NP   25 mg at 03/13/24 1039   ibuprofen  (ADVIL ) tablet 600 mg  600 mg Oral Q8H PRN Weber, Kyra A, NP   600 mg at 03/13/24 1039   insulin  aspart (novoLOG ) injection 0-20 Units  0-20 Units Subcutaneous TID WC Asuncion Layer I, NP   3 Units at 03/14/24 8295   insulin  aspart (novoLOG ) injection 4 Units  4 Units Subcutaneous TID WC Asuncion Layer I, NP   4 Units at 03/14/24 6213   insulin  glargine-yfgn (SEMGLEE ) injection 10 Units  10 Units Subcutaneous QHS Weber, Kyra A, NP   10 Units at 03/13/24 2130   lisinopril  (ZESTRIL ) tablet 20 mg  20 mg Oral Daily Weber, Kyra A, NP   20 mg at 03/13/24 0736   magnesium  hydroxide (MILK OF MAGNESIA) suspension 30 mL  30 mL Oral Daily PRN Weber, Kyra A, NP   30 mL at 03/12/24 0745   metFORMIN  (GLUCOPHAGE ) tablet 1,000 mg  1,000 mg Oral BID WC Weber, Kyra A, NP   1,000 mg at 03/13/24 1723   nicotine  polacrilex (NICORETTE ) gum 2 mg  2 mg Oral PRN Alver Jobs, MD   2 mg at 03/12/24 1456   ondansetron  (ZOFRAN ) tablet 4 mg  4 mg Oral Q8H PRN Weber, Kyra A, NP       traZODone  (DESYREL ) tablet 50 mg  50 mg Oral QHS Nwoko, Devra Fontana I, NP   50 mg at 03/13/24 2104    Lab Results:  Results for orders placed or performed during the hospital encounter of 03/09/24 (from the past 48 hours)  Glucose, capillary     Status: Abnormal   Collection  Time: 03/12/24 12:03 PM  Result Value Ref Range   Glucose-Capillary 204 (H) 70 - 99 mg/dL    Comment: Glucose reference range applies only to samples taken after fasting for at least 8 hours.  Glucose, capillary     Status: Abnormal   Collection Time: 03/12/24  5:09 PM  Result Value Ref  Range   Glucose-Capillary 104 (H) 70 - 99 mg/dL    Comment: Glucose reference range applies only to samples taken after fasting for at least 8 hours.  Glucose, capillary     Status: Abnormal   Collection Time: 03/12/24  7:56 PM  Result Value Ref Range   Glucose-Capillary 150 (H) 70 - 99 mg/dL    Comment: Glucose reference range applies only to samples taken after fasting for at least 8 hours.  Glucose, capillary     Status: Abnormal   Collection Time: 03/13/24  5:56 AM  Result Value Ref Range   Glucose-Capillary 131 (H) 70 - 99 mg/dL    Comment: Glucose reference range applies only to samples taken after fasting for at least 8 hours.   Comment 1 Notify RN    Comment 2 Document in Chart   Glucose, capillary     Status: Abnormal   Collection Time: 03/13/24 12:01 PM  Result Value Ref Range   Glucose-Capillary 131 (H) 70 - 99 mg/dL    Comment: Glucose reference range applies only to samples taken after fasting for at least 8 hours.  Glucose, capillary     Status: Abnormal   Collection Time: 03/13/24  5:06 PM  Result Value Ref Range   Glucose-Capillary 119 (H) 70 - 99 mg/dL    Comment: Glucose reference range applies only to samples taken after fasting for at least 8 hours.  Glucose, capillary     Status: Abnormal   Collection Time: 03/13/24  9:23 PM  Result Value Ref Range   Glucose-Capillary 152 (H) 70 - 99 mg/dL    Comment: Glucose reference range applies only to samples taken after fasting for at least 8 hours.  Glucose, capillary     Status: Abnormal   Collection Time: 03/14/24  6:19 AM  Result Value Ref Range   Glucose-Capillary 129 (H) 70 - 99 mg/dL    Comment: Glucose reference range applies  only to samples taken after fasting for at least 8 hours.    Blood Alcohol level:  Lab Results  Component Value Date   ETH 151 (H) 03/08/2024   ETH 205 (H) 01/31/2024    Metabolic Labs: Lab Results  Component Value Date   HGBA1C 12.8 (H) 01/31/2024   MPG 320.66 01/31/2024   No results found for: "PROLACTIN" Lab Results  Component Value Date   CHOL 139 05/20/2022   TRIG 65 05/20/2022   HDL 54 05/20/2022   CHOLHDL 2.6 05/20/2022   VLDL 13 05/20/2022   LDLCALC 72 05/20/2022    Sleep:Sleep: Good Number of Hours of Sleep: 8.5   Physical Findings: AIMS: No  CIWA:    COWS:     Psychiatric Specialty Exam:  Presentation  General Appearance: Casual  Eye Contact:Good  Speech:Clear and Coherent; Normal Rate  Speech Volume:Normal  Handedness:Right   Mood and Affect  Mood: "good now"  Affect:Congruent   Thought Process  Thought Processes:Coherent  Descriptions of Associations:Intact  Orientation:Full (Time, Place and Person)  Thought Content:Logical  History of Schizophrenia/Schizoaffective disorder:No  Duration of Psychotic Symptoms: None reported  Hallucinations:Hallucinations: None  Ideas of Reference:Other (comment)  Suicidal Thoughts:Suicidal Thoughts: No  Homicidal Thoughts:Homicidal Thoughts: No   Sensorium  Memory:Immediate Good; Recent Good; Remote Good  Judgment:Fair  Insight:Fair   Executive Functions  Concentration:Good  Attention Span:Good  Recall:Good  Fund of Knowledge:Fair  Language:Good   Psychomotor Activity  Psychomotor Activity:Psychomotor Activity: Normal   Assets  Assets:Communication Skills; Desire for Improvement; Housing; Social Support   Sleep  Sleep:Sleep: Good Number of Hours of Sleep: 8.5   Physical Exam ROS  Physical Exam Constitutional:      Appearance: the patient is not toxic-appearing.  Pulmonary:     Effort: Pulmonary effort is normal.  Neurological:     General: No focal  deficit present.     Mental Status: the patient is alert and oriented to person, place, and time.   Review of Systems  Respiratory:  Negative for shortness of breath.   Cardiovascular:  Negative for chest pain.  Gastrointestinal:  Negative for abdominal pain, constipation, diarrhea, nausea and vomiting.  Neurological:  Negative for headaches.   Blood pressure 120/61, pulse (!) 105, temperature 98.4 F (36.9 C), temperature source Oral, resp. rate 16, height 5\' 6"  (1.676 m), weight 111.1 kg, last menstrual period 02/15/2024, SpO2 100%. Body mass index is 39.54 kg/m.  Treatment Plan Summary: Daily contact with patient to assess and evaluate symptoms and progress in treatment, Medication management, and Plan    ASSESSMENT: 28 year old AA female with prior hx of MDD, GAD, borderline personality disorder, alcohol use disorder, multiple previous psychiatric admissions, treatments & an outpatient psychiatric care with. Admitted to the [facility] voluntarily from the [facility] with complaint of suicide attempt by overdose on 3 tablets of Lexapro  10 mg. A review of his lab results has shown that his BAL is 151 & hgba1c is 12.8. Patient was taken to the [facility] with complaints of suicide attempt by overdose on Lexapro  10 mg tabs (15 tablets per chart review) however, only (3 tablets) per patient's reports.   Reports somnolence from abilify , will change to nighttime dosing. Will attempt to contact family regarding discharge planning.   Diagnoses / Active Problems: MDD GAD Borderline personality disorder Alcohol use disorder   PLAN: Safety and Monitoring:  --  VOLUNTARY admission to inpatient psychiatric unit for safety, stabilization and treatment  -- Daily contact with patient to assess and evaluate symptoms and progress in treatment  -- Patient's case to be discussed in multi-disciplinary team meeting  -- Observation Level : q15 minute checks  -- Vital signs:  q12 hours  --  Precautions: suicide, elopement, and assault  2. Psychiatric Diagnoses and Treatment:   -Discontinued Lexapro  10 mg (Pt misused).   -Continue Fluoxetine  20 mg po daily.  -Continue Trazodone  50 mg po q hs for insomnia.  -Continue Hydroxyzine  25 mg po tid prn for anxiety. -Continue Nicorette  gum 2 mg prn for nicotine  withdrawal.  -Continue Abilify  5 mg po daily as an adjunct to Prozac , change to evening time dosing  -- The risks/benefits/side-effects/alternatives to this medication were discussed in detail with the patient and time was given for questions. The patient consents to medication trial.              -- Metabolic profile and EKG monitoring obtained while on an atypical antipsychotic  BMI: Body mass index is 39.54 kg/m. TSH:  Lab Results  Component Value Date   TSH 0.361 05/20/2022   Lipid Panel:   01/2024: LDL 83 HbgA1c: 01/2024 10.9 QTc: Qtc 449, HR 122             -- Encouraged patient to participate in unit milieu and in scheduled group therapies   -- Short Term Goals: Ability to identify changes in lifestyle to reduce recurrence of condition will improve, Ability to verbalize feelings will improve, Ability to disclose and discuss suicidal ideas, Ability to demonstrate self-control will improve, Ability to identify and develop effective  coping behaviors will improve, Ability to maintain clinical measurements within normal limits will improve, Compliance with prescribed medications will improve, and Ability to identify triggers associated with substance abuse/mental health issues will improve  -- Long Term Goals: Improvement in symptoms so as ready for discharge  Other PRNS:  acetaminophen , alum & mag hydroxide-simeth, haloperidol  **AND** diphenhydrAMINE , haloperidol  lactate **AND** diphenhydrAMINE  **AND** LORazepam , haloperidol  lactate **AND** diphenhydrAMINE  **AND** LORazepam , hydrOXYzine , ibuprofen , magnesium  hydroxide, nicotine  polacrilex, ondansetron   -- As needed agitation  protocol in-place   3. Medical Issues Being Addressed:  -Continue Lisinopril  20 mg po daily for HTN.  -Continue Metformin  1,000 mg po bid for diabetes.  -Continue SEMGLEE  10 units SubQ at bedtime for DM.  -Continue the CBG/siding insulin  coverage as recommended.  -Continue the insulin  meal coverage as recommended.  4. Discharge Planning:   -- Social work and case management to assist with discharge planning and identification of hospital follow-up needs prior to discharge  -- Estimated LOS: 5/20-5/21/25  -- Discharge Concerns: Need to establish a safety plan; Medication compliance and effectiveness  -- Discharge Goals: Return home with outpatient referrals for mental health follow-up including medication management/psychotherapy   I certify that inpatient services furnished can reasonably be expected to improve the patient's condition.   This note was created using a voice recognition software as a result there may be grammatical errors inadvertently enclosed that do not reflect the nature of this encounter. Every attempt is made to correct such errors.   This case was discussed with attending Dr. Linnie Riches who agrees with the above formulated treatment plan. Please see attending attestation for additional details.   Dr. Norbert Bean, MD PGY-2, Psychiatry Residency  5/19/20257:03 AM

## 2024-03-15 LAB — GLUCOSE, CAPILLARY
Glucose-Capillary: 136 mg/dL — ABNORMAL HIGH (ref 70–99)
Glucose-Capillary: 139 mg/dL — ABNORMAL HIGH (ref 70–99)

## 2024-03-15 MED ORDER — ARIPIPRAZOLE 5 MG PO TABS: 5.0000 mg | ORAL_TABLET | Freq: Every day | ORAL | 0 refills | Status: AC

## 2024-03-15 MED ORDER — NALTREXONE HCL 50 MG PO TABS: 50.0000 mg | ORAL_TABLET | Freq: Every day | ORAL | 0 refills | Status: AC

## 2024-03-15 MED ORDER — TRAZODONE HCL 50 MG PO TABS: 50.0000 mg | ORAL_TABLET | Freq: Every day | ORAL | 0 refills | Status: AC

## 2024-03-15 MED ORDER — NALTREXONE HCL 50 MG PO TABS
50.0000 mg | ORAL_TABLET | Freq: Every day | ORAL | Status: DC
  Administered 2024-03-15: 50 mg via ORAL
  Filled 2024-03-15: qty 1

## 2024-03-15 MED ORDER — FLUOXETINE HCL 20 MG PO CAPS: 20.0000 mg | ORAL_CAPSULE | Freq: Every day | ORAL | 0 refills | Status: DC

## 2024-03-15 MED ORDER — NICOTINE POLACRILEX 2 MG MT GUM: 2.0000 mg | CHEWING_GUM | OROMUCOSAL | 0 refills | Status: AC | PRN

## 2024-03-15 MED ORDER — HYDROXYZINE HCL 25 MG PO TABS: 25.0000 mg | ORAL_TABLET | Freq: Three times a day (TID) | ORAL | 0 refills | Status: AC | PRN

## 2024-03-15 NOTE — Plan of Care (Signed)
   Problem: Education: Goal: Emotional status will improve Outcome: Progressing Goal: Mental status will improve Outcome: Progressing Goal: Verbalization of understanding the information provided will improve Outcome: Progressing   Problem: Activity: Goal: Interest or engagement in activities will improve Outcome: Progressing Goal: Sleeping patterns will improve Outcome: Progressing

## 2024-03-15 NOTE — Group Note (Signed)
 Recreation Therapy Group Note   Group Topic:Animal Assisted Therapy   Group Date: 03/15/2024 Start Time: 0945 End Time: 1030 Facilitators: Aashka Salomone-McCall, LRT,CTRS Location: [address] Dayroom   Animal-Assisted Activity (AAA) Program Checklist/Progress Notes Patient Eligibility Criteria Checklist & Daily Group note for Rec Tx Intervention  AAA/T Program Assumption of Risk Form signed by Patient/ or Parent Legal Guardian Yes  Patient is free of allergies or severe asthma Yes  Patient reports no fear of animals Yes  Patient reports no history of cruelty to animals Yes  Patient understands his/her participation is voluntary Yes  Patient washes hands before animal contact Yes  Patient washes hands after animal contact Yes  Behavioral Response:  Appropriate  Education: Hand Washing, Appropriate Animal Interaction   Education Outcome: Acknowledges education.   Clinical Observations/Feedback: Patient attended session and interacted appropriately with therapy dog and peers. Patient asked appropriate questions about therapy dog and his training. Patient shared stories about their pets at home with group.   Sofia Dunn, LRT/CTRS    Affect/Mood: Appropriate   Participation Level: Active   Participation Quality: Independent   Behavior: Appropriate   Speech/Thought Process: Focused   Insight: Good   Judgement: Good   Modes of Intervention: Teaching laboratory technician   Patient Response to Interventions:  Engaged   Education Outcome:  In group clarification offered    Clinical Observations/Individualized Feedback: Patient attended session and interacted appropriately with therapy dog and peers. Patient asked appropriate questions about therapy dog and his training. Patient shared stories about their pets at home with group.    Plan: Continue to engage patient in RT group sessions 2-3x/week.   Jimesha Rising-McCall, LRT,CTRS 03/15/2024 12:32 PM

## 2024-03-15 NOTE — Group Note (Signed)
 Date:  03/15/2024 Time:  10:03 AM  Group Topic/Focus:  Goals Group:   The focus of this group is to help patients establish daily goals to achieve during treatment and discuss how the patient can incorporate goal setting into their daily lives to aide in recovery.    Participation Level:  Active  Participation Quality:  Attentive  Affect:  Appropriate  Cognitive:  Alert  Insight: Good  Engagement in Group:  Engaged  Modes of Intervention:  Education  Additional Comments:  Engaged in group and shared her coping skills that she will use in society.  Tammy Hayes 03/15/2024, 10:03 AM

## 2024-03-15 NOTE — Plan of Care (Signed)
   Problem: Education: Goal: Emotional status will improve Outcome: Progressing Goal: Mental status will improve Outcome: Progressing Goal: Verbalization of understanding the information provided will improve Outcome: Progressing   Problem: Activity: Goal: Interest or engagement in activities will improve Outcome: Progressing   Problem: Coping: Goal: Ability to demonstrate self-control will improve Outcome: Progressing   Problem: Safety: Goal: Periods of time without injury will increase Outcome: Progressing

## 2024-03-15 NOTE — Discharge Summary (Signed)
 Physician Discharge Summary Note  Patient:  Tammy Hayes is an 28 y.o., female MRN:  [id number] DOB:  1996-05-29 Patient phone:  [phone] (home)  Patient address:   [address] [place] [address],   Date of Admission:  03/09/2024 Date of Discharge: 03/15/2024  Reason for Admission:  28 year old AA female with prior hx of MDD, GAD, borderline personality disorder, alcohol use disorder, multiple previous psychiatric admissions, treatments & an outpatient psychiatric care. Admitted to the [facility] voluntarily from the [facility] with complaint of suicide attempt by overdose on 3 tablets of Lexapro  10 mg. Lab results has shown that her BAL is 151 & hgba1c is 12.8. Patient was taken to the [facility] with complaints of suicide attempt by overdose on Lexapro  10 mg tabs (15 tablets per chart review) however, only (3 tablets) per patient's reports.   Principal Problem: MDD (major depressive disorder), recurrent severe, without psychosis (HCC) Discharge Diagnoses: Principal Problem:   MDD (major depressive disorder), recurrent severe, without psychosis (HCC) Active Problems:   MDD (major depressive disorder)   Generalized anxiety disorder  Past Psychiatric History:  Patient reports has been depressed since she was 28 years old. Has been treated at the [facility] in [place]. Says had attempted suicide x 4 by overdose on medications/self-mutilating behaviors. Has been receiving mental health care on an outpatient basis at the [facility] clinic in [place], [place].   Past Medical History:  Past Medical History:  Diagnosis Date   Bilateral ovarian cysts    Depression    Pediatric overweight 07/13/2013   Pre-diabetes     Past Surgical History:  Procedure Laterality Date   EYE MUSCLE SURGERY     in 3rd grade. No reported residual weakness.    Family History:  Family History  Problem Relation Age of Onset   Depression Mother        grandparents, aunts/uncles.    Heart  disease Other        aunts/uncles   Hypertension Other        aunts/uncles, grandparents   Kidney disease Other        aunts/uncles, grandparents   Stroke Other        aunts/uncles   Cancer Neg Hx    Family Psychiatric  History: Patient reports her paternal aunt has schizophrenia   Social History:  Single, has no children, unemployed, lives with father, aunt & other family members.   Social History   Substance and Sexual Activity  Alcohol Use Not Currently     Social History   Substance and Sexual Activity  Drug Use Yes   Types: Marijuana   Comment: In the past used marijuana. None in about one year.    Social History   Socioeconomic History   Marital status: Single    Spouse name: Not on file   Number of children: Not on file   Years of education: Not on file   Highest education level: Not on file  Occupational History   Not on file  Tobacco Use   Smoking status: Never   Smokeless tobacco: Never  Vaping Use   Vaping status: Some Days  Substance and Sexual Activity   Alcohol use: Not Currently   Drug use: Yes    Types: Marijuana    Comment: In the past used marijuana. None in about one year.   Sexual activity: Yes    Partners: Male    Birth control/protection: None  Other Topics Concern  Not on file  Social History Narrative   Lives with father 60% of the time. Student at [employer] finished 7th grade.    Christian per father.    Social Drivers of Corporate investment banker Strain: Not on file  Food Insecurity: Food Insecurity Present (03/09/2024)   Hunger Vital Sign    Worried About Running Out of Food in the Last Year: Sometimes true    Ran Out of Food in the Last Year: Sometimes true  Transportation Needs: Unmet Transportation Needs (03/09/2024)   PRAPARE - Administrator, Civil Service (Medical): Yes    Lack of Transportation (Non-Medical): Yes  Physical Activity: Not on file  Stress: Not on file  Social Connections:  Socially Isolated (01/31/2024)   Social Connection and Isolation Panel [NHANES]    Frequency of Communication with Friends and Family: Once a week    Frequency of Social Gatherings with Friends and Family: Once a week    Attends Religious Services: More than 4 times per year    Active Member of [employer] or Organizations: No    Attends Banker Meetings: Never    Marital Status: Never married    Hospital Course:   During the patient's hospitalization, patient had extensive initial psychiatric evaluation, and follow-up psychiatric evaluations every day.  Psychiatric diagnoses provided upon initial assessment:  -MDD -GAD  Patient's psychiatric medications were adjusted on admission:  -Discontinued Lexapro  10 mg (Pt misused).  -Decreased Fluoxetine  from 40 mg to 20 mg po daily.  -Initiated Trazodone  50 mg po q hs for insomnia.  -Initiated Hydroxyzine  25 mg po tid prn for anxiety.  During the hospitalization, other adjustments were made to the patient's psychiatric medication regimen:  -Added abilify  5mg  for antidepressant augmentation -Started naltrexone 50mg  for alcohol use disorder  Patient's care was discussed during the interdisciplinary team meeting every day during the hospitalization.  The patient denied having side effects to prescribed psychiatric medication.  Gradually, patient started adjusting to milieu. The patient was evaluated each day by a clinical provider to ascertain response to treatment. Improvement was noted by the patient's report of decreasing symptoms, improved sleep and appetite, affect, medication tolerance, behavior, and participation in unit programming.  Patient was asked each day to complete a self inventory noting mood, mental status, pain, new symptoms, anxiety and concerns.    Symptoms were reported as significantly decreased or resolved completely by discharge.   On day of discharge, the patient reports that their mood is stable. The patient  denied having suicidal thoughts for more than 48 hours prior to discharge.  Patient denies having homicidal thoughts.  Patient denies having auditory hallucinations.  Patient denies any visual hallucinations or other symptoms of psychosis. The patient was motivated to continue taking medication with a goal of continued improvement in mental health. She reports she is open to stopping her alcohol use and going to [facility]. She reports improvement in her mood with abilify .   The patient reports their target psychiatric symptoms of depression responded well to the psychiatric medications, and the patient reports overall benefit other psychiatric hospitalization. Supportive psychotherapy was provided to the patient. The patient also participated in regular group therapy while hospitalized. Coping skills, problem solving as well as relaxation therapies were also part of the unit programming.  Labs were reviewed with the patient, and abnormal results were discussed with the patient.  The patient is able to verbalize their individual safety plan to this provider.  Behavioral Events: none  #  It is recommended to the patient to continue psychiatric medications as prescribed, after discharge from the hospital.    # It is recommended to the patient to follow up with your outpatient psychiatric provider and PCP.  # It was discussed with the patient, the impact of alcohol, drugs, tobacco have been there overall psychiatric and medical wellbeing, and total abstinence from substance use was recommended to the patient.  # Prescriptions provided or sent directly to preferred pharmacy at discharge. Patient agreeable to plan. Given opportunity to ask questions. Appears to feel comfortable with discharge.    # In the event of worsening symptoms, the patient is instructed to call the crisis hotline, 911 and or go to the nearest ED for appropriate evaluation and treatment of symptoms. To follow-up with primary care  provider for other medical issues, concerns and or health care needs  # Patient was discharged home with a plan to follow up as noted below.   Physical Findings: AIMS:  , ,  ,  ,    CIWA:    COWS:     Musculoskeletal: Strength & Muscle Tone: within normal limits Gait & Station: normal Patient leans: N/A  Psychiatric Specialty Exam General Appearance: appears at stated age, casually dressed and groomed   Behavior: pleasant and cooperative   Psychomotor Activity: no psychomotor agitation or retardation noted   Eye Contact: fair  Speech: normal amount, volume and fluency   Mood: euthymic  Affect: congruent, pleasant and interactive   Thought Process: linear, goal directed, no circumstantial or tangential thought process noted, no racing thoughts or flight of ideas  Descriptions of Associations: intact   Thought Content Hallucinations: denies AH, VH , does not appear responding to stimuli  Delusions: no paranoia, delusions of control, grandeur, ideas of reference, thought broadcasting, and magical thinking  Suicidal Thoughts: denies SI, intention, plan  Homicidal Thoughts: denies HI, intention, plan   Alertness/Orientation: alert and fully oriented   Insight: fair Judgment: fair  Memory: intact   Executive Functions  Concentration: intact  Attention Span: fair  Recall: intact  Fund of Knowledge: fair   Physical Exam ROS Physical Exam Constitutional:      Appearance: the patient is not toxic-appearing.  Pulmonary:     Effort: Pulmonary effort is normal.  Neurological:     General: No focal deficit present.     Mental Status: the patient is alert and oriented to person, place, and time.   Review of Systems  Respiratory:  Negative for shortness of breath.   Cardiovascular:  Negative for chest pain.  Gastrointestinal:  Negative for abdominal pain, constipation, diarrhea, nausea and vomiting.  Neurological:  Negative for headaches.   Blood pressure 119/71,  pulse (!) 104, temperature 98.6 F (37 C), temperature source Oral, resp. rate 16, height 5\' 6"  (1.676 m), weight 111.1 kg, last menstrual period 02/15/2024, SpO2 100%. Body mass index is 39.54 kg/m.  Social History   Tobacco Use  Smoking Status Never  Smokeless Tobacco Never   Tobacco Cessation:  A prescription for an FDA-approved tobacco cessation medication provided at discharge  Blood Alcohol level:  Lab Results  Component Value Date   ETH 151 (H) 03/08/2024   ETH 205 (H) 01/31/2024    Metabolic Disorder Labs:  Lab Results  Component Value Date   HGBA1C 12.8 (H) 01/31/2024   MPG 320.66 01/31/2024   No results found for: "PROLACTIN" Lab Results  Component Value Date   CHOL 139 05/20/2022   TRIG 65 05/20/2022  HDL 54 05/20/2022   CHOLHDL 2.6 05/20/2022   VLDL 13 05/20/2022   LDLCALC 72 05/20/2022    Discharge destination:  Home  Is patient on multiple antipsychotic therapies at discharge:  No   Has Patient had three or more failed trials of antipsychotic monotherapy by history:  No  Recommended Plan for Multiple Antipsychotic Therapies: NA  Discharge Instructions     Call MD for:  difficulty breathing, headache or visual disturbances   Complete by: As directed    Call MD for:  extreme fatigue   Complete by: As directed    Call MD for:  hives   Complete by: As directed    Call MD for:  persistant dizziness or light-headedness   Complete by: As directed    Call MD for:  persistant nausea and vomiting   Complete by: As directed    Call MD for:  severe uncontrolled pain   Complete by: As directed    Call MD for:  temperature >100.4   Complete by: As directed    Diet - low sodium heart healthy   Complete by: As directed    Increase activity slowly   Complete by: As directed       Allergies as of 03/15/2024       Reactions   Penicillins Anaphylaxis, Itching, Other (See Comments)   Did it involve swelling of the face/tongue/throat, SOB, or low BP?  Yes Did it involve sudden or severe rash/hives, skin peeling, or any reaction on the inside of your mouth or nose? Y Did you need to seek medical attention at a hospital or doctor's office? Y When did it last happen?     Childhood. If all above answers are "NO", may proceed with cephalosporin use.        Medication List     STOP taking these medications    escitalopram  10 MG tablet Commonly known as: LEXAPRO        TAKE these medications      Indication  ARIPiprazole  5 MG tablet Commonly known as: ABILIFY  Take 1 tablet (5 mg total) by mouth at bedtime.  Indication: To augment antidepressant.   blood glucose meter kit and supplies Dispense based on patient and insurance preference. Use up to four times daily as directed. (FOR ICD-10 E10.9, E11.9).  Indication: Diabetes   FLUoxetine  20 MG capsule Commonly known as: PROZAC  Take 1 capsule (20 mg total) by mouth daily. What changed:  medication strength how much to take  Indication: Generalized Anxiety Disorder, Major Depressive Disorder   FreeStyle Libre 3 Reader Seymour Dapper USE AS DIRECTED  Indication: Diabetes   FreeStyle Libre 3 Sensor Misc Place 1 sensor on the skin every 14 days. Use to check glucose continuously  Indication: Diabetes   hydrOXYzine  25 MG tablet Commonly known as: ATARAX  Take 1 tablet (25 mg total) by mouth 3 (three) times daily as needed for anxiety.  Indication: Feeling Anxious   Lantus  SoloStar 100 UNIT/ML Solostar Pen Generic drug: insulin  glargine Inject 10 Units into the skin at bedtime.  Indication: Type 2 Diabetes   lisinopril  20 MG tablet Commonly known as: ZESTRIL  Take 1 tablet by mouth daily.  Indication: High Blood Pressure   metFORMIN  1000 MG tablet Commonly known as: GLUCOPHAGE  Take 1,000 mg by mouth 2 (two) times daily with a meal. What changed: Another medication with the same name was removed. Continue taking this medication, and follow the directions you see here.   Indication: Type 2 Diabetes   naltrexone 50  MG tablet Commonly known as: DEPADE Take 1 tablet (50 mg total) by mouth daily.  Indication: Abuse or Misuse of Alcohol   Nexplanon 68 MG Impl implant Generic drug: etonogestrel 1 each by Subdermal route once.  Indication: Birth Control Treatment   nicotine  polacrilex 2 MG gum Commonly known as: NICORETTE  Take 1 each (2 mg total) by mouth as needed for smoking cessation. What changed:  how to take this reasons to take this  Indication: Nicotine  Addiction   Pen Needles 32G X 5 MM Misc USE ONCE DAILY WITH LANTUS  PEN  Indication: Diabetes   traZODone  50 MG tablet Commonly known as: DESYREL  Take 1 tablet (50 mg total) by mouth at bedtime.  Indication: Trouble Sleeping   Vitamin D (Ergocalciferol) 1.25 MG (50000 UNIT) Caps capsule Commonly known as: DRISDOL Take 50,000 Units by mouth once a week.  Indication: Vitamin D Deficiency        Follow-up Information     [facility] Go on 03/18/2024.   Why: You have a hospital follow up appointment n 03/18/24 at 12:45 pm for therapy and medication management services, Contact information: [address] [place] [address] [phone]                 Discharge recommendations:  Activity: as tolerated  Diet: heart healthy  # It is recommended to the patient to continue psychiatric medications as prescribed, after discharge from the hospital.     # It is recommended to the patient to follow up with your outpatient psychiatric provider -instructions on appointment date, time, and address (location) are provided to you in discharge paperwork  # Follow-up with outpatient primary care doctor and other specialists -for management of chronic medical disease, including:  -DM -HTN  # Testing: Follow-up with outpatient provider for abnormal lab results:  -Calcium 8.6   # It was discussed with the patient, the impact of alcohol, drugs, tobacco have been there  overall psychiatric and medical wellbeing, and total abstinence from substance use was recommended to the patient.   # Prescriptions provided or sent directly to preferred pharmacy at discharge. Patient agreeable to plan. Given opportunity to ask questions. Appears to feel comfortable with discharge.    # In the event of worsening symptoms, the patient is instructed to call the crisis hotline, 911 and or go to the nearest ED for appropriate evaluation and treatment of symptoms. To follow-up with primary care provider for other medical issues, concerns and or health care needs  Patient agrees with D/C instructions and plan.   This case was discussed with attending Dr. Zouev who agrees with the above formulated treatment plan. Please see attending attestation for additional details.   This note was created using a voice recognition software as a result there may be grammatical errors inadvertently enclosed that do not reflect the nature of this encounter. Every attempt is made to correct such errors.   Signed: Norbert Bean, MD, PGY-2 03/15/2024, 9:14 AM

## 2024-03-15 NOTE — Progress Notes (Addendum)
  [facility] Adult Case Management Discharge Plan :  Will you be returning to the same living situation after discharge:  Yes,  patient will return to her aunt's home At discharge, do you have transportation home?: Yes,  CSW arranged transportation through [facility] Do you have the ability to pay for your medications: Yes,  patient has insurance  Release of information consent forms completed and in the chart;  Patient's signature needed at discharge.  Patient to Follow up at:  Follow-up Information     [facility] Go on 03/18/2024.   Why: You have a hospital follow up appointment n 03/18/24 at 12:45 pm for therapy and medication management services, Contact information: [address] [place] [address] [phone]         [facility] Follow up.   Why: You have referred to this provider for group therapy. Please follow-up post-discharge regarding this referral. Contact information: [address] [place], [place] [address] Phone: [phone]  Fax: [phone]                Next level of care provider has access to [facility] Link:no  Safety Planning and Suicide Prevention discussed: Valri Gee, Teshia Mahone, aunt, [phone]   Has patient been referred to the Quitline?:  Yes, a referral was submitted (Quitline website).  A print-out was given to patient.  Patient has been referred for addiction treatment: Yes, patient admitted to drinking alcohol excessively.  She said she will work on it in outpatient therapy.  Her appointment is scheduled for 03/18/2024.   Gaynelle Pastrana O Mohammedali Bedoy, LCSWA 03/15/2024, 10:44 AM

## 2024-03-15 NOTE — Group Note (Signed)
 LCSW Group Therapy Note   Group Date: 03/15/2024 Start Time: 1100 End Time: 1200   Participation:  patient was present and somewhat participated in the discussion.  Type of Therapy:  Group Therapy  Topic:  "Healing Flames: Navigating Anger with Compassion"  Objective:  Foster self-awareness and promote compassion toward oneself and others when dealing with anger.  Goals:  Help participants understand the underlying emotions and needs fueling anger. Provide coping strategies for healthier emotional expression and anger management.  Summary: This session explored anger as a volcano--an explosion driven by deeper feelings and unmet needs. Participants learned to identify anger triggers and underlying emotions, then practiced coping strategies like deep breathing, physical activity, and journaling. The group discussed healthy ways to manage anger before it escalates, using both personal reflection and shared experiences.  Therapeutic Modalities: Cognitive Behavioral Therapy (CBT): Challenging thoughts that fuel anger. Mindfulness: Increasing awareness of emotions and sensations.   Autumnrose Yore O Lielle Vandervort, LCSWA 03/15/2024  12:39 PM

## 2024-03-15 NOTE — Progress Notes (Signed)
 Patient verbalizes readiness for discharge. All patient belongings returned to patient. Discharge instructions read and discussed with patient (appointments, medications, resources). Patient expressed gratitude for care provided. Patient discharged to lobby at 1325 where taxi driver was waiting.

## 2024-03-15 NOTE — Transportation (Signed)
 03/15/2024  Tammy Hayes DOB: 1996/06/02 MRN: [id number]   RIDER WAIVER AND RELEASE OF LIABILITY  For the purposes of helping with transportation needs, [facility] partners with outside transportation providers (taxi companies, [employer], Catering manager.) to give [facility] patients or other approved people the choice of on-demand rides [employer]") to our buildings for non-emergency visits.  By using [employer], I, the person signing this document, on behalf of myself and/or any legal minors (in my care using the [employer]), agree:  Science writer given to me are supplied by independent, outside transportation providers who do not work for, or have any affiliation with, [employer]. [facility] is not a transportation company. [facility] has no control over the quality or safety of the rides I get using [employer]. [facility] has no control over whether any outside ride will happen on time or not. [facility] gives no guarantee on the reliability, quality, safety, or availability on any rides, or that no mistakes will happen. I know and accept that traveling by vehicle (car, truck, SVU, [employer] Chi, bus, taxi, etc.) has risks of serious injuries such as disability, being paralyzed, and death. I know and agree the risk of using [employer] is mine alone, and not [employer]. Transport Services are provided "as is" and as are available. The transportation providers are in charge for all inspections and care of the vehicles used to provide these rides. I agree not to take legal action against [facility], its agents, employees, officers, directors, representatives, insurers, attorneys, assigns, successors, subsidiaries, and affiliates at any time for any reasons related directly or indirectly to using [employer]. I also agree not to take legal action against [facility] or its affiliates for any injury, death, or damage to property caused by or related to using  [employer]. I have read this Waiver and Release of Liability, and I understand the terms used in it and their legal meaning. This Waiver is freely and voluntarily given with the understanding that my right (or any legal minors) to legal action against [facility] relating to [employer] is knowingly given up to use these services.   I attest that I read the Ride Waiver and Release of Liability to Tammy Hayes, gave Ms. Mosqueda the opportunity to ask questions and answered the questions asked (if any). I affirm that Tammy Hayes then provided consent for assistance with transportation.   Tauna Macfarlane, LCSWA 03/15/2024

## 2024-03-15 NOTE — Discharge Instructions (Signed)

## 2024-03-15 NOTE — [facility] Suicide Risk Assessment (Addendum)
 [facility] INPATIENT:  Family/Significant Other Suicide Prevention Education  Suicide Prevention Education:  Education Completed; Magalie Almon, aunt, [phone],  (name of family member/significant other) has been identified by the patient as the family member/significant other with whom the patient will be residing, and identified as the person(s) who will aid the patient in the event of a mental health crisis (suicidal ideations/suicide attempt).  With written consent from the patient, the family member/significant other has been provided the following suicide prevention education, prior to the and/or following the discharge of the patient.  The suicide prevention education provided includes the following: Suicide risk factors Suicide prevention and interventions National Suicide Hotline telephone number [facility] assessment telephone number [facility] Emergency Assistance 911 [facility] and/or Residential Mobile Crisis Unit telephone number  Request made of family/significant other to: Remove weapons (e.g., guns, rifles, knives), all items previously/currently identified as safety concern.   Remove drugs/medications (over-the-counter, prescriptions, illicit drugs), all items previously/currently identified as a safety concern.  Aunt said that they don't have any guns or weapons.  She said that she will secure medications and sharp objects (such as knives and scissors).  When asked if she has any safety concerns about patient returning home, she said that she worried that patient drinks alcohol excessively.   Aunt stated she will be home when patient arrives.      The family member/significant other verbalizes understanding of the suicide prevention education information provided.  The family member/significant other agrees to remove the items of safety concern listed above.  Bon Dowis O Ariadna Setter, LCSWA 03/15/2024, 10:41 AM

## 2024-03-15 NOTE — [facility] Group Notes (Signed)

## 2024-03-15 NOTE — [facility] Suicide Risk Assessment (Signed)
 [facility] Discharge Suicide Risk Assessment  Principal Problem: MDD (major depressive disorder), recurrent severe, without psychosis (HCC) Discharge Diagnoses: Principal Problem:   MDD (major depressive disorder), recurrent severe, without psychosis (HCC) Active Problems:   MDD (major depressive disorder)   Generalized anxiety disorder  Reason for admission: 28 year old AA female with prior hx of MDD, GAD, borderline personality disorder, alcohol use disorder, multiple previous psychiatric admissions, treatments & an outpatient psychiatric care. Admitted to the [facility] voluntarily from the [facility] with complaint of suicide attempt by overdose on 3 tablets of Lexapro  10 mg. Lab results has shown that her BAL is 151 & hgba1c is 12.8. Patient was taken to the [facility] with complaints of suicide attempt by overdose on Lexapro  10 mg tabs (15 tablets per chart review) however, only (3 tablets) per patient's reports.   Hospital Course:  During the patient's hospitalization, patient had extensive initial psychiatric evaluation, and follow-up psychiatric evaluations every day.   Psychiatric diagnoses provided upon initial assessment:  -MDD -GAD   Patient's psychiatric medications were adjusted on admission:  -Discontinued Lexapro  10 mg (Pt misused).  -Decreased Fluoxetine  from 40 mg to 20 mg po daily.  -Initiated Trazodone  50 mg po q hs for insomnia.  -Initiated Hydroxyzine  25 mg po tid prn for anxiety.   During the hospitalization, other adjustments were made to the patient's psychiatric medication regimen:  -Added abilify  5mg  for antidepressant augmentation   Patient's care was discussed during the interdisciplinary team meeting every day during the hospitalization.   The patient denied having side effects to prescribed psychiatric medication.   Gradually, patient started adjusting to milieu. The patient was evaluated each day by a clinical provider to ascertain response to  treatment. Improvement was noted by the patient's report of decreasing symptoms, improved sleep and appetite, affect, medication tolerance, behavior, and participation in unit programming.  Patient was asked each day to complete a self inventory noting mood, mental status, pain, new symptoms, anxiety and concerns.     Symptoms were reported as significantly decreased or resolved completely by discharge.    On day of discharge, the patient reports that their mood is stable. The patient denied having suicidal thoughts for more than 48 hours prior to discharge.  Patient denies having homicidal thoughts.  Patient denies having auditory hallucinations.  Patient denies any visual hallucinations or other symptoms of psychosis. The patient was motivated to continue taking medication with a goal of continued improvement in mental health.    The patient reports their target psychiatric symptoms of depression responded well to the psychiatric medications, and the patient reports overall benefit other psychiatric hospitalization. Supportive psychotherapy was provided to the patient. The patient also participated in regular group therapy while hospitalized. Coping skills, problem solving as well as relaxation therapies were also part of the unit programming.   Labs were reviewed with the patient, and abnormal results were discussed with the patient.   The patient is able to verbalize their individual safety plan to this provider.   Behavioral Events: none   # It is recommended to the patient to continue psychiatric medications as prescribed, after discharge from the hospital.     # It is recommended to the patient to follow up with your outpatient psychiatric provider and PCP.   # It was discussed with the patient, the impact of alcohol, drugs, tobacco have been there overall psychiatric and medical wellbeing, and total abstinence from substance use was recommended to the patient.   #  Prescriptions provided  or sent directly to preferred pharmacy at discharge. Patient agreeable to plan. Given opportunity to ask questions. Appears to feel comfortable with discharge.    # In the event of worsening symptoms, the patient is instructed to call the crisis hotline, 911 and or go to the nearest ED for appropriate evaluation and treatment of symptoms. To follow-up with primary care provider for other medical issues, concerns and or health care needs   # Patient was discharged home with a plan to follow up as noted below.  Sleep  Sleep: 7 hours   Musculoskeletal: Strength & Muscle Tone: within normal limits Gait & Station: normal Patient leans: N/A  Mental Status Exam  General Appearance: appears at stated age, casually dressed and groomed   Behavior: pleasant and cooperative   Psychomotor Activity: no psychomotor agitation or retardation noted   Eye Contact: fair  Speech: normal amount, volume and fluency   Mood: euthymic  Affect: congruent, pleasant and interactive   Thought Process: linear, goal directed, no circumstantial or tangential thought process noted, no racing thoughts or flight of ideas  Descriptions of Associations: intact   Thought Content Hallucinations: denies AH, VH , does not appear responding to stimuli  Delusions: no paranoia, delusions of control, grandeur, ideas of reference, thought broadcasting, and magical thinking  Suicidal Thoughts: denies SI, intention, plan  Homicidal Thoughts: denies HI, intention, plan   Alertness/Orientation: alert and fully oriented   Insight: fair Judgment: fair  Memory: intact   Executive Functions  Concentration: intact  Attention Span: fair  Recall: intact  Fund of Knowledge: fair   Sleep  Sleep: 7 hours   Assets  Assets: Manufacturing systems engineer; Desire for Improvement; Housing; Social Support   Physical Exam ROS Physical Exam Constitutional:      Appearance: the patient is not toxic-appearing.  Pulmonary:      Effort: Pulmonary effort is normal.  Neurological:     General: No focal deficit present.     Mental Status: the patient is alert and oriented to person, place, and time.   Review of Systems  Respiratory:  Negative for shortness of breath.   Cardiovascular:  Negative for chest pain.  Gastrointestinal:  Negative for abdominal pain, constipation, diarrhea, nausea and vomiting.  Neurological:  Negative for headaches.   Blood pressure 119/71, pulse (!) 104, temperature 98.6 F (37 C), temperature source Oral, resp. rate 16, height 5\' 6"  (1.676 m), weight 111.1 kg, last menstrual period 02/15/2024, SpO2 100%. Body mass index is 39.54 kg/m.  Mental Status Per Nursing Assessment::   On Admission:  Suicidal ideation indicated by patient  Demographic Factors:  Adolescent or young adult, Low socioeconomic status, and Unemployed  Loss Factors: Family conflict  Historical Factors: Prior suicide attempts and Impulsivity  Risk Reduction Factors:   Living with another person, especially a relative and Positive social support  Continued Clinical Symptoms:  Depression:   Impulsivity Previous Psychiatric Diagnoses and Treatments  Cognitive Features That Contribute To Risk:  Thought constriction (tunnel vision)    Suicide Risk:  Mild: There are no identifiable plans, no associated intent, mild dysphoria and related symptoms, good self-control (both objective and subjective assessment), few other risk factors, and identifiable protective factors, including available and accessible social support.    Follow-up Information     [facility] [facility] Go on 03/18/2024.   Why: You have a hospital follow up appointment n 03/18/24 at 12:45 pm for therapy and medication management services, Contact information: [address]  [place] [address] [phone]                 Discharge recommendations:   Activity: as tolerated   Diet: heart healthy   # It is recommended to  the patient to continue psychiatric medications as prescribed, after discharge from the hospital.     # It is recommended to the patient to follow up with your outpatient psychiatric provider -instructions on appointment date, time, and address (location) are provided to you in discharge paperwork   # Follow-up with outpatient primary care doctor and other specialists -for management of chronic medical disease, including:  -DM -HTN   # Testing: Follow-up with outpatient provider for abnormal lab results:  -Calcium 8.6   # It was discussed with the patient, the impact of alcohol, drugs, tobacco have been there overall psychiatric and medical wellbeing, and total abstinence from substance use was recommended to the patient.   # Prescriptions provided or sent directly to preferred pharmacy at discharge. Patient agreeable to plan. Given opportunity to ask questions. Appears to feel comfortable with discharge.    # In the event of worsening symptoms, the patient is instructed to call the crisis hotline, 911 and or go to the nearest ED for appropriate evaluation and treatment of symptoms. To follow-up with primary care provider for other medical issues, concerns and or health care needs  Patient agrees with D/C instructions and plan.   This case was discussed with attending Dr. Zouev who agrees with the above formulated treatment plan. Please see attending attestation for additional details.   This note was created using a voice recognition software as a result there may be grammatical errors inadvertently enclosed that do not reflect the nature of this encounter. Every attempt is made to correct such errors.   Norbert Bean, MD, PGY-2 03/15/2024, 6:44 AM

## 2024-03-17 ENCOUNTER — Ambulatory Visit (INDEPENDENT_AMBULATORY_CARE_PROVIDER_SITE_OTHER): Payer: MEDICAID | Admitting: Medical

## 2024-03-17 VITALS — BP 142/84 | HR 105 | Temp 98.0°F | Resp 18 | Ht 66.0 in | Wt 252.8 lb

## 2024-03-17 DIAGNOSIS — F419 Anxiety disorder, unspecified: Secondary | ICD-10-CM

## 2024-03-17 DIAGNOSIS — E1165 Type 2 diabetes mellitus with hyperglycemia: Secondary | ICD-10-CM | POA: Diagnosis not present

## 2024-03-17 DIAGNOSIS — F32A Depression, unspecified: Secondary | ICD-10-CM | POA: Diagnosis not present

## 2024-03-17 DIAGNOSIS — Z794 Long term (current) use of insulin: Secondary | ICD-10-CM | POA: Diagnosis not present

## 2024-03-17 MED ORDER — LANTUS SOLOSTAR 100 UNIT/ML ~~LOC~~ SOPN: 10.0000 [IU] | PEN_INJECTOR | Freq: Every day | SUBCUTANEOUS | 0 refills | Status: DC

## 2024-03-17 NOTE — Progress Notes (Signed)
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 low BP? Yes Did it involve sudden or severe rash/hives, skin peeling, or any reaction on the inside of your mouth or nose? Y Did you need to seek medical attention at a hospital or doctor's office? Y When did it last happen?     Childhood. If all above answers are "NO", may  proceed with cephalosporin use.     Current Outpatient Medications on File Prior to Visit  Medication Sig Dispense Refill   ARIPiprazole  (ABILIFY ) 5 MG tablet Take 1 tablet (5 mg total) by mouth at bedtime. 30 tablet 0   blood glucose meter kit and supplies Dispense based on patient and insurance preference. Use up to four times daily as directed. (FOR ICD-10 E10.9, E11.9). 1 each 0   Continuous Glucose Receiver (FREESTYLE LIBRE 3 READER) DEVI USE AS DIRECTED 1 each 0    Continuous Glucose Sensor (FREESTYLE LIBRE 3 SENSOR) MISC Place 1 sensor on the skin every 14 days. Use to check glucose continuously 2 each 3   etonogestrel (NEXPLANON) 68 MG IMPL implant 1 each by Subdermal route once.     FLUoxetine  (PROZAC ) 20 MG capsule Take 1 capsule (20 mg total) by mouth daily. 30 capsule 0   hydrOXYzine  (ATARAX ) 25 MG tablet Take 1 tablet (25 mg total) by mouth 3 (three) times daily as needed for anxiety. 30 tablet 0   Insulin  Pen Needle (PEN NEEDLES) 32G X 5 MM MISC USE ONCE DAILY WITH LANTUS  PEN 100 each 0   LANTUS  SOLOSTAR 100 UNIT/ML Solostar Pen Inject 10 Units into the skin at bedtime.     lisinopril  (ZESTRIL ) 20 MG tablet Take 1 tablet by mouth daily.     metFORMIN  (GLUCOPHAGE ) 1000 MG tablet Take 1,000 mg by mouth 2 (two) times daily with a meal.     naltrexone (DEPADE) 50 MG tablet Take 1 tablet (50 mg total) by mouth daily. 30 tablet 0   nicotine  polacrilex (NICORETTE ) 2 MG gum Take 1 each (2 mg total) by mouth as needed for smoking cessation. 100 tablet 0   traZODone  (DESYREL ) 50 MG tablet Take 1 tablet (50 mg total) by mouth at bedtime. 30 tablet 0   Vitamin D, Ergocalciferol, (DRISDOL) 1.25 MG (50000 UNIT) CAPS capsule Take 50,000 Units by mouth once a week.     No current facility-administered medications on file prior to visit.    BP (!) 142/84   Pulse (!) 105   Temp 98 F (36.7 C)   Resp 18   Ht 5\' 6"  (1.676 m)   Wt 252 lb 12.8 oz (114.7 kg)   LMP 02/15/2024   SpO2 98%   BMI 40.80 kg/m        Objective:   Physical Exam  General Mental Status- Alert. General Appearance- Not in acute distress.   Skin General: Color- Normal Color. Moisture- Normal Moisture.  Neck Carotid Arteries- Normal color. Moisture- Normal Moisture. No carotid bruits. No JVD.  Chest and Lung Exam Auscultation: Breath Sounds:-CTA  Cardiovascular Auscultation:Rythm- RRR Murmurs & Other Heart Sounds:Auscultation of the heart reveals- No  Murmurs.  Abdomen Inspection:-Inspeection Normal. Palpation/Percussion:Note:No mass. Palpation and Percussion of the abdomen reveal- Non Tender, Non Distended + BS, no rebound or guarding.   Neurologic Cranial Nerve exam:- CN III-XII intact(No nystagmus), symmetric smile. Strength:- 5/5 equal and symmetric strength both upper and lower extremities.       Assessment & Plan:   Patient Instructions  Suicide attempt with medication overdose Recent attempts with Lexapro  and alcohol. No current suicidal ideation. Anxiety managed. Requires psychiatric follow-up. - Ensure follow-up with psychiatrist and counselor at [facility]. - Request psychiatrist or counselor to contact me tomorrow. Gave my number/business card - Advised to call crisis hotline or visit ED if experiencing suicidal thoughts.  Depression Depression with recent medication adjustments. On Abilify , hydroxyzine , naltrexone, trazodone , reduced Prozac . Lexapro  discontinued.  Requires psychiatric management. - Ensure follow-up with psychiatrist for medication management. - Verify current medication regimen with psychiatrist.  Anxiety Anxiety managed. Hydroxyzine  prescribed but not picked up. Requires psychiatric management. - Pick up hydroxyzine  prescription. - Ensure follow-up with psychiatrist for anxiety management.  Alcohol use disorder Alcohol use disorder with naltrexone prescribed. Recent suicide attempt involved alcohol. Requires management and support. - Continue naltrexone as prescribed. - Ensure follow-up with psychiatrist for alcohol use disorder management.  Type 2 Diabetes Mellitus Recent blood glucose averaging 195 mg/dL. No hypoglycemic episodes. On Lantus  and metformin . Freestyle Libre in use. - Refill Lantus  10 units at night. - Continue metformin  1000 mg twice daily. - Monitor blood glucose levels and report lows. - Schedule follow-up in two weeks.  Follow up in 2 weeks or sooner if needed   Carrell Palmatier,  PA-C   Time spent with patient today was 40  minutes which consisted of chart review, discussing diagnosis, work up treatment and documentation.

## 2024-03-17 NOTE — Patient Instructions (Signed)
 Suicide attempt with medication overdose Recent attempts with Lexapro  and alcohol. No current suicidal ideation. Anxiety managed. Requires psychiatric follow-up. - Ensure follow-up with psychiatrist and counselor at [facility]. - Request psychiatrist or counselor to contact me tomorrow. Gave my number/business card - Advised to call crisis hotline or visit ED if experiencing suicidal thoughts.  Depression Depression with recent medication adjustments. On Abilify , hydroxyzine , naltrexone, trazodone , reduced Prozac . Lexapro  discontinued. Requires psychiatric management. - Ensure follow-up with psychiatrist for medication management. - Verify current medication regimen with psychiatrist.  Anxiety Anxiety managed. Hydroxyzine  prescribed but not picked up. Requires psychiatric management. - Pick up hydroxyzine  prescription. - Ensure follow-up with psychiatrist for anxiety management.  Alcohol use disorder Alcohol use disorder with naltrexone prescribed. Recent suicide attempt involved alcohol. Requires management and support. - Continue naltrexone as prescribed. - Ensure follow-up with psychiatrist for alcohol use disorder management.  Type 2 Diabetes Mellitus Recent blood glucose averaging 195 mg/dL. No hypoglycemic episodes. On Lantus  and metformin . Freestyle Libre in use. - Refill Lantus  10 units at night. - Continue metformin  1000 mg twice daily. - Monitor blood glucose levels and report lows. - Schedule follow-up in two weeks.  Follow up in 2 weeks or sooner if needed

## 2024-03-18 ENCOUNTER — Telehealth: Payer: Self-pay | Admitting: Psychiatry

## 2024-03-23 DIAGNOSIS — F331 Major depressive disorder, recurrent, moderate: Secondary | ICD-10-CM | POA: Diagnosis not present

## 2024-03-23 DIAGNOSIS — H5213 Myopia, bilateral: Secondary | ICD-10-CM | POA: Diagnosis not present

## 2024-03-23 MED ORDER — FREESTYLE LIBRE 3 SENSOR MISC: 3 refills | Status: DC

## 2024-03-31 ENCOUNTER — Ambulatory Visit (INDEPENDENT_AMBULATORY_CARE_PROVIDER_SITE_OTHER): Payer: MEDICAID | Admitting: Medical

## 2024-03-31 VITALS — BP 121/64 | HR 92 | Temp 98.7°F | Resp 16 | Ht 66.0 in | Wt 262.0 lb

## 2024-03-31 DIAGNOSIS — E66812 Obesity, class 2: Secondary | ICD-10-CM | POA: Diagnosis not present

## 2024-03-31 DIAGNOSIS — F32A Depression, unspecified: Secondary | ICD-10-CM

## 2024-03-31 DIAGNOSIS — E1165 Type 2 diabetes mellitus with hyperglycemia: Secondary | ICD-10-CM | POA: Diagnosis not present

## 2024-03-31 DIAGNOSIS — Z794 Long term (current) use of insulin: Secondary | ICD-10-CM

## 2024-03-31 DIAGNOSIS — F419 Anxiety disorder, unspecified: Secondary | ICD-10-CM

## 2024-03-31 NOTE — Patient Instructions (Signed)
 Diabetes Mellitus Episodes of hypoglycemia with Lantus  use. Discontinued Lantus  due to hypoglycemia risk outweighing benefits. - Discontinue Lantus . - Continue metformin  1000 mg twice a day. - Monitor blood glucose closely. - Bring glucose meter to next appointment. will see if sugars spiking up and if need to get back on lantus  - Follow up in one week.  Depression and Anxiety Intermittent rare transient suicidal thoughts. . Emphasized importance of med adherence and alcohol avoidance. Stable now but if ever peristing thoughts harm to self or other then ED evaluation as we discussed - Continue sertraline 50 mg once a day. - Continue Abilify  5 mg. - Attend appointment with RN at behavioral health. - Avoid alcohol.  Substance Use Disorder Alcohol use persists but less.  Stopped marijuana. Emphasized alcohol avoidance for mental health stability. - Attend substance abuse program at [facility]. - Avoid alcohol. -[facility] as we discussed  Follow up in one week
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
## 2024-04-01 ENCOUNTER — Telehealth: Payer: Self-pay | Admitting: Medical

## 2024-04-01 NOTE — Telephone Encounter (Signed)
 Copied from CRM [id number]. Topic: General - Call Back - No Documentation >> Apr 01, 2024  3:16 PM DeAngela L wrote: Reason for CRM: [facility] in [place], calling after Patient told him Dr Lari Pleva would like to speak with him and discuss her visit with him, he leaves his contact number for the Dr to give him a call back pt is at an appt in his office now  Alix Aquas [phone] fax [phone]

## 2024-04-07 DIAGNOSIS — F331 Major depressive disorder, recurrent, moderate: Secondary | ICD-10-CM | POA: Diagnosis not present

## 2024-04-07 DIAGNOSIS — F10120 Alcohol abuse with intoxication, uncomplicated: Secondary | ICD-10-CM | POA: Diagnosis not present

## 2024-04-07 NOTE — Telephone Encounter (Signed)
 I did call back Tammy Hayes. Got update on [facility]. Pt [facility] which is higher level than former planned rehab. Pt will continue to see pscyhiatrist until then. Pt estimates will get in with [facility] within 1-2 weeks.

## 2024-04-08 ENCOUNTER — Ambulatory Visit: Payer: MEDICAID | Admitting: Medical

## 2024-04-08 DIAGNOSIS — F332 Major depressive disorder, recurrent severe without psychotic features: Secondary | ICD-10-CM | POA: Diagnosis not present

## 2024-04-08 DIAGNOSIS — F1010 Alcohol abuse, uncomplicated: Secondary | ICD-10-CM | POA: Diagnosis not present

## 2024-04-09 DIAGNOSIS — F332 Major depressive disorder, recurrent severe without psychotic features: Secondary | ICD-10-CM | POA: Diagnosis not present

## 2024-04-10 DIAGNOSIS — F332 Major depressive disorder, recurrent severe without psychotic features: Secondary | ICD-10-CM | POA: Diagnosis not present

## 2024-04-11 DIAGNOSIS — F332 Major depressive disorder, recurrent severe without psychotic features: Secondary | ICD-10-CM | POA: Diagnosis not present

## 2024-04-12 DIAGNOSIS — F332 Major depressive disorder, recurrent severe without psychotic features: Secondary | ICD-10-CM | POA: Diagnosis not present

## 2024-04-13 ENCOUNTER — Telehealth: Payer: Self-pay

## 2024-04-13 NOTE — Transitions of Care (Post Inpatient/ED Visit) (Signed)
   04/13/2024  Name: Tammy Hayes MRN: [id number] DOB: 07/16/1996  Today's TOC FU Call Status: Today's TOC FU Call Status:: Unsuccessful Call (1st Attempt) Unsuccessful Call (1st Attempt) Date: 04/13/24  Attempted to reach the patient regarding the most recent Inpatient/ED visit.  Follow Up Plan: Additional outreach attempts will be made to reach the patient to complete the Transitions of Care (Post Inpatient/ED visit) call.   Gareld June, BSN, RN [place]  [facility] - [employer] [facility] RN Care Manager [phone]

## 2024-04-13 NOTE — Transitions of Care (Post Inpatient/ED Visit) (Signed)
 04/13/2024  Name: Tammy Hayes MRN: [id number] DOB: 1996/02/01  Today's TOC FU Call Status: Today's TOC FU Call Status:: Successful TOC FU Call Completed TOC FU Call Complete Date: 04/13/24 Patient's Name and Date of Birth confirmed.  Transition Care Management Follow-up Telephone Call Discharge Facility: Other ([facility] Facility) Name of Other ([facility]) Discharge Facility: [facility] Type of Discharge: Inpatient Admission Primary Inpatient Discharge Diagnosis:: ETOH Abuse How have you been since you were released from the hospital?: Better Any questions or concerns?: No  Items Reviewed: Did you receive and understand the discharge instructions provided?: Yes Medications obtained,verified, and reconciled?: Yes (Medications Reviewed) Any new allergies since your discharge?: No Dietary orders reviewed?: NA Do you have support at home?: No  Medications Reviewed Today: Medications Reviewed Today     Reviewed by Claudene Crystal, RN (Case Manager) on 04/13/24 at [id number]  Med List Status: <None>   Medication Order Taking? Sig Documenting Provider Last Dose Status Informant  ARIPiprazole  (ABILIFY ) 5 MG tablet [id number] Yes Take 1 tablet (5 mg total) by mouth at bedtime. Norbert Bean, MD  Active   blood glucose meter kit and supplies [id number] Yes Dispense based on patient and insurance preference. Use up to four times daily as directed. (FOR ICD-10 E10.9, E11.9). [place], Eric J, DO  Active Self  Continuous Glucose Receiver (FREESTYLE LIBRE 3 READER) DEVI [id number] Yes USE AS DIRECTED Saguier, Gaylin Ke, PA-C  Active Self  Continuous Glucose Sensor (FREESTYLE LIBRE 3 SENSOR) [place] [id number] Yes Place 1 sensor on the skin every 14 days. Use to check glucose continuously Saguier, Edward, PA-C  Active   etonogestrel (NEXPLANON) 68 MG IMPL implant [id number] Yes 1 each by Subdermal route once. [provider]  Active Self  FLUoxetine  (PROZAC ) 20 MG capsule [id number] Yes Take 1  capsule (20 mg total) by mouth daily. Chien, Stephanie, MD  Active   hydrOXYzine  (ATARAX ) 25 MG tablet [id number] Yes Take 1 tablet (25 mg total) by mouth 3 (three) times daily as needed for anxiety. Norbert Bean, MD  Active   Insulin  Pen Needle (PEN NEEDLES) 32G X 5 MM MISC [id number] Yes USE ONCE DAILY WITH LANTUS  PEN SaguierSlater Duncan  Active Self  LANTUS  SOLOSTAR 100 UNIT/ML Solostar Pen [id number] Yes Inject 10 Units into the skin at bedtime. Saguier, Gaylin Ke, PA-C  Active   lisinopril  (ZESTRIL ) 20 MG tablet [id number] Yes Take 1 tablet by mouth daily. [provider]  Active Self  metFORMIN  (GLUCOPHAGE ) 1000 MG tablet [id number] Yes Take 1,000 mg by mouth 2 (two) times daily with a meal. [provider]  Active Self  naltrexone  (DEPADE) 50 MG tablet [id number] Yes Take 1 tablet (50 mg total) by mouth daily. Chien, Stephanie, MD  Active   nicotine  polacrilex (NICORETTE ) 2 MG gum [id number] Yes Take 1 each (2 mg total) by mouth as needed for smoking cessation. Chien, Stephanie, MD  Active   traZODone  (DESYREL ) 50 MG tablet [id number] Yes Take 1 tablet (50 mg total) by mouth at bedtime. Chien, Stephanie, MD  Active   Vitamin D, Ergocalciferol, (DRISDOL) 1.25 MG (50000 UNIT) CAPS capsule [id number] Yes Take 50,000 Units by mouth once a week. [provider]  Active Self            Home Care and Equipment/Supplies: Were Home Health Services Ordered?: NA Any new equipment or medical supplies ordered?: NA  Functional Questionnaire: Do you need assistance with bathing/showering or dressing?: No Do you need assistance with meal preparation?: No Do you need assistance with  eating?: No Do you have difficulty maintaining continence: No Do you need assistance with getting out of bed/getting out of a chair/moving?: No Do you have difficulty managing or taking your medications?: No  Follow up appointments reviewed:    SDOH Interventions Today    Flowsheet Row Most  Recent Value  SDOH Interventions   Food Insecurity Interventions Community Resources Provided, Other (Comment)  [The patient has EBT but is having trouble. Referred her to her Case Manager]  Housing Interventions Intervention Not Indicated  Transportation Interventions Patient Declined  Utilities Interventions Intervention Not Indicated    Gareld June, BSN, RN [place]  [facility]BCI - Population Health RN Care Manager [phone]

## 2024-04-18 ENCOUNTER — Ambulatory Visit (HOSPITAL_BASED_OUTPATIENT_CLINIC_OR_DEPARTMENT_OTHER)
Admission: RE | Admit: 2024-04-18 | Discharge: 2024-04-18 | Disposition: A | Discharge status: Home / Self Care | Payer: MEDICAID | Source: Ambulatory Visit | Attending: Medical | Admitting: Medical

## 2024-04-18 ENCOUNTER — Telehealth: Payer: Self-pay | Admitting: *Deleted

## 2024-04-18 ENCOUNTER — Inpatient Hospital Stay: Payer: MEDICAID | Admitting: Medical

## 2024-04-18 ENCOUNTER — Ambulatory Visit (INDEPENDENT_AMBULATORY_CARE_PROVIDER_SITE_OTHER): Payer: MEDICAID | Admitting: Medical

## 2024-04-18 VITALS — BP 140/80 | HR 100 | Resp 18 | Ht 66.0 in | Wt 255.0 lb

## 2024-04-18 DIAGNOSIS — E1165 Type 2 diabetes mellitus with hyperglycemia: Secondary | ICD-10-CM | POA: Diagnosis not present

## 2024-04-18 DIAGNOSIS — F411 Generalized anxiety disorder: Secondary | ICD-10-CM

## 2024-04-18 DIAGNOSIS — F32A Depression, unspecified: Secondary | ICD-10-CM

## 2024-04-18 DIAGNOSIS — M79644 Pain in right finger(s): Secondary | ICD-10-CM | POA: Insufficient documentation

## 2024-04-18 DIAGNOSIS — Z7984 Long term (current) use of oral hypoglycemic drugs: Secondary | ICD-10-CM

## 2024-04-18 DIAGNOSIS — I10 Essential (primary) hypertension: Secondary | ICD-10-CM

## 2024-04-18 DIAGNOSIS — F1010 Alcohol abuse, uncomplicated: Secondary | ICD-10-CM

## 2024-04-18 DIAGNOSIS — F419 Anxiety disorder, unspecified: Secondary | ICD-10-CM

## 2024-04-18 DIAGNOSIS — R252 Cramp and spasm: Secondary | ICD-10-CM

## 2024-04-18 LAB — COMPREHENSIVE METABOLIC PANEL WITH GFR
ALT: 21 U/L (ref 0–35)
AST: 17 U/L (ref 0–37)
Albumin: 4.3 g/dL (ref 3.5–5.2)
Alkaline Phosphatase: 46 U/L (ref 39–117)
BUN: 12 mg/dL (ref 6–23)
CO2: 28 meq/L (ref 19–32)
Calcium: 9.3 mg/dL (ref 8.4–10.5)
Chloride: 106 meq/L (ref 96–112)
Creatinine, Ser: 0.81 mg/dL (ref 0.40–1.20)
GFR: 100.48 mL/min (ref >60.00)
Glucose, Bld: 95 mg/dL (ref 70–99)
Potassium: 4.2 meq/L (ref 3.5–5.1)
Sodium: 140 meq/L (ref 135–145)
Total Bilirubin: 0.5 mg/dL (ref 0.2–1.2)
Total Protein: 6.9 g/dL (ref 6.0–8.3)

## 2024-04-18 LAB — MAGNESIUM: Magnesium: 1.9 mg/dL (ref 1.5–2.5)

## 2024-04-18 NOTE — Patient Instructions (Addendum)
 Thumb pain, right Follow xray and if negative will refer to sport med for further evaluation. - DG Finger Thumb Right; Future  Type 2 diabetes mellitus with hyperglycemia, with long-term current use of insulin  (HCC) -stay on metformin . Hold lantus  until get clinical pharmacist appointment help as do have concern on your low sugar events. -asking staff to follow up on your cgm supplies/sensor. - AMB Referral [facility] Care Management . Hypertension, unspecified type Continue lisinopril  daily.  Anxiety and depression (Primary) and Alcohol abuse -continue abilify , lexapro ,trazadone, hyroxyzine and naltrexon. -follow up with [facility] and [facility].  -glad to hear [facility] coming to your house at 2 pm. -if any thoughts of harm to self or others be seen in the ED. - Comp Met (CMET)   Cramp and spasm - Comp Met (CMET) - Magnesium    Follow up 10 days or sooner if needed

## 2024-04-18 NOTE — Telephone Encounter (Signed)
 Per [place] pt needs her cgm supplies. issue with new sensor/   can you help with that.
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
## 2024-04-18 NOTE — Telephone Encounter (Signed)
 Spoke with pt and she stated that she had an xray done in [place] and had to remover her sensor.  She was not given a paper of what to do.  I advised patient to give company a call on her box and they should be able to get her another sensor at no cost.  Advised pt to call and follow up to let us  know if they helped her.

## 2024-04-19 ENCOUNTER — Ambulatory Visit: Payer: Self-pay | Admitting: Medical

## 2024-04-19 NOTE — Addendum Note (Signed)
 Addended by: DORINA DALLAS HERO on: 04/19/2024 05:40 AM   Modules accepted: Orders
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
## 2024-04-25 ENCOUNTER — Telehealth: Payer: Self-pay

## 2024-04-25 ENCOUNTER — Ambulatory Visit: Payer: MEDICAID | Admitting: Family Medicine

## 2024-04-25 NOTE — Progress Notes (Unsigned)
 Complex Care Management Note Care Guide Note  04/25/2024 Name: Tammy Hayes MRN: [id number] DOB: [birth year]   Complex Care Management Outreach Attempts: An unsuccessful telephone outreach was attempted today to offer the patient information about available complex care management services.  Follow Up Plan:  Additional outreach attempts will be made to offer the patient complex care management information and services.   Encounter Outcome:  No Answer  Dreama [facility]  [facility], [facility] Health Care Management Assistant Direct Dial: [phone]  Fax: [phone]

## 2024-04-27 ENCOUNTER — Ambulatory Visit: Payer: MEDICAID | Admitting: Medical

## 2024-04-27 NOTE — Progress Notes (Signed)
 Complex Care Management Note  Care Guide Note 04/27/2024 Name: Tammy Hayes MRN: [id number] DOB: 1996-04-22  Tammy Hayes is a 28 y.o. year old female who sees Saguier, [place], PA-C for primary care. I reached out to Ronal FORBES Baseman by phone today to offer complex care management services.  Ms. Charo was given information about Complex Care Management services today including:   The Complex Care Management services include support from the care team which includes your Nurse Care Manager, Clinical Social Worker, or Pharmacist.  The Complex Care Management team is here to help remove barriers to the health concerns and goals most important to you. Complex Care Management services are voluntary, and the patient may decline or stop services at any time by request to their care team member.   Complex Care Management Consent Status: Patient agreed to services and verbal consent obtained.   Follow up plan:  Telephone appointment with complex care management team member scheduled for:  05/02/24 at 1:00 p.m.   Encounter Outcome:  Patient Scheduled  Dreama [facility]  [facility], [facility] Health Care Management Assistant Direct Dial: [phone]  Fax: [phone]

## 2024-04-27 NOTE — Progress Notes (Signed)
 Complex Care Management Note Care Guide Note  04/27/2024 Name: Tammy Hayes MRN: [id number] DOB: 09-Apr-1996   Complex Care Management Outreach Attempts: A second unsuccessful outreach was attempted today to offer the patient with information about available complex care management services.  Follow Up Plan:  Additional outreach attempts will be made to offer the patient complex care management information and services.   Encounter Outcome:  No Answer  Dreama [facility]  [facility], [facility] Health Care Management Assistant Direct Dial: [phone]  Fax: [phone]

## 2024-04-28 ENCOUNTER — Ambulatory Visit (INDEPENDENT_AMBULATORY_CARE_PROVIDER_SITE_OTHER): Payer: MEDICAID

## 2024-04-28 ENCOUNTER — Other Ambulatory Visit: Payer: Self-pay

## 2024-04-28 ENCOUNTER — Encounter: Payer: Self-pay | Admitting: Family Medicine

## 2024-04-28 ENCOUNTER — Ambulatory Visit: Payer: MEDICAID | Admitting: Family Medicine

## 2024-04-28 VITALS — BP 110/70 | HR 90 | Ht 66.0 in | Wt 251.0 lb

## 2024-04-28 DIAGNOSIS — M79644 Pain in right finger(s): Secondary | ICD-10-CM | POA: Diagnosis not present

## 2024-04-28 DIAGNOSIS — G8929 Other chronic pain: Secondary | ICD-10-CM
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
## 2024-04-28 NOTE — Patient Instructions (Signed)
 Thank you for coming in today.   Please get an Xray today before you leave   I've referred you to Hand Therapy.  Let us  know if you don't hear from them in one week.   Wear the brace  Check back as needed  *Reminder: Dr. Joane will be out of the office starting August 1st, for about 6 weeks

## 2024-05-02 ENCOUNTER — Telehealth: Payer: MEDICAID

## 2024-05-02 ENCOUNTER — Ambulatory Visit: Payer: Self-pay | Admitting: Family Medicine

## 2024-05-02 ENCOUNTER — Telehealth: Payer: Self-pay | Admitting: Pharmacist

## 2024-05-02 DIAGNOSIS — F250 Schizoaffective disorder, bipolar type: Secondary | ICD-10-CM | POA: Diagnosis not present

## 2024-05-02 NOTE — Telephone Encounter (Signed)
 A second attempt was made to contact patient by phone today by Clinical Pharmacist regarding diabetes / hypoglycemic events Unable to reach patient. LM on VM with my contact number [phone].

## 2024-05-02 NOTE — Telephone Encounter (Signed)
 Attempt was made to contact patient by phone today for follow up by Clinical Pharmacist regarding type 2 DM with recent hypoglycemia. .  Unable to reach patient. LM on VM with my contact number [phone] or [phone]

## 2024-05-02 NOTE — Progress Notes (Signed)
 Right thumb x-ray shows no broken bones.

## 2024-05-03 NOTE — Telephone Encounter (Signed)
 Copied from CRM [id number]. Topic: General - Other >> May 03, 2024  5:16 PM Lavanda D wrote: Reason for CRM: Patient returned call from Tammy outside of office hours, LVM for Tammy to return her call and assist with scheduling.   ----------------------------------------------------------------------- From previous Reason for Contact - Scheduling: Patient/patient representative is calling to schedule an appointment. Refer to attachments for appointment information.

## 2024-05-04 ENCOUNTER — Institutional Professional Consult (permissible substitution) (INDEPENDENT_AMBULATORY_CARE_PROVIDER_SITE_OTHER): Payer: MEDICAID | Admitting: Family Medicine

## 2024-05-05 ENCOUNTER — Telehealth: Payer: Self-pay

## 2024-05-05 NOTE — Telephone Encounter (Signed)
 Copied from CRM [id number]. Topic: Clinical - Medication Question >> May 05, 2024 11:03 AM Martinique E wrote: Reason for CRM: Patient called in wanting to know the timeframe of when she should take her alcohol shots. Callback number [phone].

## 2024-05-06 ENCOUNTER — Telehealth: Payer: Self-pay

## 2024-05-06 NOTE — Telephone Encounter (Signed)
 Copied from CRM [id number]. Topic: Clinical - Medication Question >> May 06, 2024 11:39 AM Mia F wrote: Reason for CRM: Patient is calling asking for [facility]. She states that she is trying to contact behavioral health center set up. But she has not been able to [place] of anyone. She said she as told to call Chiquita back if she had any issues reaching the bh center.

## 2024-05-06 NOTE — Progress Notes (Unsigned)
 Complex Care Management Note Care Guide Note  05/06/2024 Name: Tammy Hayes MRN: [id number] DOB: Sep 17, 1996   Complex Care Management Outreach Attempts: An unsuccessful telephone outreach was attempted today to offer the patient information about available complex care management services.  Follow Up Plan:  Additional outreach attempts will be made to offer the patient complex care management information and services.   Encounter Outcome:  No Answer  Dream[facility]  [facility], [facility] Health Care Management Assistant Direct Dial: [phone]  Fax: [phone]

## 2024-05-06 NOTE — Telephone Encounter (Signed)
 Copied from CRM [id number]. Topic: General - Other >> May 06, 2024 11:01 AM Berneda FALCON wrote: Reason for CRM: Pt is returning phone call to Chiquita about her alcoholism shots. She wants to know when she should get them, please. Called the CAL to see if Chiquita was available. Was informed she was in with a patient. Please call patient back at earliest convenience.   Patient callback is [phone]

## 2024-05-06 NOTE — Telephone Encounter (Signed)
 Referral was sent to wrong behavorial health office ... Asked referral team to update referral, unable to reach anyone at [facility] to verify medication.SABRA

## 2024-05-06 NOTE — Telephone Encounter (Signed)
 Pt called and lvm to return call to give info. E2C2 can give number and address listed below .SABRA   Patient is better off going by the office to schedule an appt rather than calling    [facility]   [address] [place], [place] [address] Phone: [phone]

## 2024-05-06 NOTE — Telephone Encounter (Signed)
 Pt called and lvm to return call

## 2024-05-06 NOTE — Telephone Encounter (Signed)
 Pt called and notified to contact behavorial health and number given

## 2024-05-06 NOTE — Telephone Encounter (Signed)
 Spoke with pt notified her that I have reached out to referral team to get her referral updated

## 2024-05-06 NOTE — Telephone Encounter (Signed)
 Refer to other phone note .

## 2024-05-06 NOTE — Telephone Encounter (Signed)
 Can you fix patient's referral to behavorial health ...   Needs to go to   [facility]  [address] [place], [place] [address] Phone: [phone]

## 2024-05-08 DIAGNOSIS — F250 Schizoaffective disorder, bipolar type: Secondary | ICD-10-CM | POA: Diagnosis not present

## 2024-05-09 DIAGNOSIS — F250 Schizoaffective disorder, bipolar type: Secondary | ICD-10-CM | POA: Diagnosis not present

## 2024-05-09 NOTE — Progress Notes (Signed)
 Complex Care Management Note Care Guide Note  05/09/2024 Name: Tammy Hayes MRN: [id number] DOB: 04-03-1996   Complex Care Management Outreach Attempts: A second unsuccessful outreach was attempted today to offer the patient with information about available complex care management services.  Follow Up Plan:  No further outreach attempts will be made at this time. We have been unable to contact the patient to offer or enroll patient in complex care management services.  Encounter Outcome:  No Answer  [facility]  [facility], [facility] Health Care Management Assistant Direct Dial: [phone]  Fax: [phone]

## 2024-05-11 ENCOUNTER — Ambulatory Visit (INDEPENDENT_AMBULATORY_CARE_PROVIDER_SITE_OTHER): Payer: MEDICAID | Admitting: Podiatry

## 2024-05-11 DIAGNOSIS — Z91199 Patient's noncompliance with other medical treatment and regimen due to unspecified reason: Secondary | ICD-10-CM

## 2024-05-11 NOTE — Progress Notes (Signed)
 Cancel 24 hours

## 2024-05-12 ENCOUNTER — Other Ambulatory Visit: Payer: Self-pay | Admitting: Pharmacist

## 2024-05-12 ENCOUNTER — Encounter: Payer: Self-pay | Admitting: Pharmacist

## 2024-05-12 DIAGNOSIS — F250 Schizoaffective disorder, bipolar type: Secondary | ICD-10-CM | POA: Diagnosis not present

## 2024-05-12 MED ORDER — NICOTINE 14 MG/24HR TD PT24: 14.0000 mg | MEDICATED_PATCH | Freq: Every day | TRANSDERMAL | 1 refills | Status: AC

## 2024-05-12 NOTE — Telephone Encounter (Signed)
 Spoke with patient - see telephone visit note from 05/12/2024

## 2024-05-12 NOTE — Progress Notes (Signed)
 05/12/2024 Name: Tammy Hayes MRN: [id number] DOB: 09/04/1996  Chief Complaint  Patient presents with   Diabetes   Medication Management    Tammy Hayes is a 28 y.o. year old female who presented for a telephone visit.   They were referred to the pharmacist by their PCP for assistance in managing diabetes and complex medication management.    Subjective:  Care Team: Primary Care Provider: Saguier, Edward, PA-C ; Next Scheduled Visit: 05/24/2024  Medication Access/Adherence  Current Pharmacy:  [id number] - [place], [place] - [address] AT [facility] [address] [place] [address] Phone: [phone] Fax: [phone]  [id number] - [place], [place] - [address] AT [facility] [address] [place] [address] Phone: [phone] Fax: [phone]   Patient reports affordability concerns with their medications: No  Patient reports access/transportation concerns to their pharmacy: Yes  Patient reports adherence concerns with their medications:  Yes  - she has not received naltrexone  380mg  monthly injection yet. Looks like our office has been working on referral to behavioral health.   Diabetes:  Current medications:  Metformin  1000mg  twice a day Lantus  10 units once a day - patient has not taken in several days due to low blood glucose   Current glucose readings: see report below Using [place] 3 sensors.   Date of Download: 05/10/2024 % Time CGM is active: 99% Average Glucose: 126 mg/dL Glucose Management Indicator: 6.3%  Glucose Variability: 16.8% (goal <36%) Time in Goal:  - Time in range 70-180: 98% - Time above range: 2% - Time below range: 0%  Observed patterns: No adverse glucose patterns noted except a few post prandial highs occurring about 1 ro 2 times per week      Patient denies hypoglycemic s/sx since she stopped Lantus  - including no dizziness, shakiness, sweating. Patient  denies hyperglycemic symptoms including no polyuria, polydipsia, polyphagia, nocturia, neuropathy, blurred vision.   Tobacco Abuse:  Tobacco Use History: Number of cigarettes per day - patient unable to tell me. She is vaping but only using 1 cartridge per day. Does not wake at night to smoke  Quit Attempt History: Most recent quit attempt currently using nicotine  gum but she does not like the taste. Methods tried in the past include Nicotine  Gum.   Current medication access support: nicotine  gum 2mg     Objective:  Lab Results  Component Value Date   HGBA1C 12.8 (H) 01/31/2024    Lab Results  Component Value Date   CREATININE 0.81 04/18/2024   BUN 12 04/18/2024   NA 140 04/18/2024   K 4.2 04/18/2024   CL 106 04/18/2024   CO2 28 04/18/2024    Lab Results  Component Value Date   CHOL 139 05/20/2022   HDL 54 05/20/2022   LDLCALC 72 05/20/2022   TRIG 65 05/20/2022   CHOLHDL 2.6 05/20/2022    Medications Reviewed Today     Reviewed by Carla Milling, RPH-CPP (Pharmacist) on 05/12/24 at 1203  Med List Status: <None>   Medication Order Taking? Sig Documenting Provider Last Dose Status Informant  ARIPiprazole  (ABILIFY ) 5 MG tablet [id number] Yes Take 1 tablet (5 mg total) by mouth at bedtime. Graham Krabbe, MD  Active   blood glucose meter kit and supplies [id number]  Dispense based on patient and insurance preference. Use up to four times daily as directed. (FOR ICD-10 E10.9, E11.9).  Patient not taking:  Reported on 05/12/2024   [place], Eric J, DO  Active Self  Continuous Glucose Receiver (FREESTYLE LIBRE 3 READER) DEVI [id number]  USE AS DIRECTED  Patient not taking: Reported on 05/12/2024   Saguier, Edward, PA-C  Active Self  Continuous Glucose Sensor (FREESTYLE LIBRE 3 SENSOR) [place] [id number] Yes Place 1 sensor on the skin every 14 days. Use to check glucose continuously Saguier, [place], PA-C  Active   escitalopram  (LEXAPRO ) 10 MG tablet [id number] Yes Take 10 mg by  mouth daily. [provider]  Active   etonogestrel (NEXPLANON) 68 MG IMPL implant [id number] Yes 1 each by Subdermal route once. [provider]  Active Self   Patient not taking:   Discontinued 05/12/24 1201 (Completed Course)   Insulin  Pen Needle (PEN NEEDLES) 32G X 5 MM MISC [id number]  USE ONCE DAILY WITH LANTUS  PEN Saguier, Dallas RIGGERS  Active Self  LANTUS  SOLOSTAR 100 UNIT/ML Solostar Pen [id number]  Inject 10 Units into the skin at bedtime.  Patient not taking: Reported on 05/12/2024   SaguierDallas, PA-C  Active   lisinopril  (ZESTRIL ) 20 MG tablet [id number] Yes Take 1 tablet by mouth daily. [provider]  Active Self  metFORMIN  (GLUCOPHAGE ) 1000 MG tablet [id number] Yes Take 1,000 mg by mouth 2 (two) times daily with a meal. [provider]  Active Self  Naltrexone  380 MG SUSR [id number] Yes Inject 380 mg into the muscle every 30 (thirty) days. [provider]  Active   nicotine  polacrilex (NICORETTE ) 2 MG gum [id number] Yes Use as directed 2 mg in the mouth or throat every 4 (four) hours while awake. [provider]  Active   terbinafine (LAMISIL) 250 MG tablet [id number] Yes Take 250 mg by mouth daily. [provider]  Active   thiamine 100 MG tablet [id number] Yes Take 100 mg by mouth daily. [provider]  Active   traZODone  (DESYREL ) 50 MG tablet [id number] Yes Take 1 tablet (50 mg total) by mouth at bedtime. Chien, Stephanie, MD  Active   Vitamin D, Ergocalciferol, (DRISDOL) 1.25 MG (50000 UNIT) CAPS capsule [id number] Yes Take 50,000 Units by mouth once a week. [provider]  Active Self              Assessment/Plan:   Diabetes: Currently uncontrolled per last A1c but her Continuous Glucose Monitor report shows improved and excellent blood glucose control over the last 14 days - Reviewed goal A1c, goal fasting, and goal 2 hour post prandial glucose - Recommend to continue metformin  1000mg  twice a  day; continue to hold Lantus  for now but will not removed from her med list yet in case we need to restart.   - Continue to use Continuous Glucose Monitor to check blood glucose several times per day.   Tobacco Abuse: Currently uncontrolled - Provided motivational interviewing to assess tobacco use and strategies for reduction - Provided information on 1 800 QUIT NOW support program - Start nicotine  patch 14 mg daily. Counseled on proper placement and potential side effects, including mild itching/redness at the location site, headache, trouble sleeping and/or vivid dreams. Advised to remove patch at night if development of trouble sleeping.    Patch schedule for <10 cigarettes daily: Apply one 14 mg patch daily for 6 weeks. Then, reduce to one 7 mg patch daily, if able.   Medication Management: -Review and updated medication list (patient is no longer taking fluoxetine , was changed to escitalopram ) - Conference called [facility] [phone] to assist with scheduling  appointment for assessment and monthly naltrexone  injection. Per representative patient will need to come to their open office hours for her initial assessment - can come 8am to noon Monday thru Friday. Patient is planning to try to go tomorrow 7/18 if she can arrange transportation.  Follow Up Plan: sees PCP 05/24/2024; Follow up with Clinical Pharmacist Practitioner in 1 month  Madelin Ray, PharmD Clinical Pharmacist [facility]  [facility] [phone]

## 2024-05-17 ENCOUNTER — Encounter (INDEPENDENT_AMBULATORY_CARE_PROVIDER_SITE_OTHER): Payer: Self-pay | Admitting: Family Medicine

## 2024-05-17 ENCOUNTER — Ambulatory Visit (INDEPENDENT_AMBULATORY_CARE_PROVIDER_SITE_OTHER): Payer: MEDICAID | Admitting: Family Medicine

## 2024-05-17 VITALS — BP 114/78 | HR 92 | Temp 98.1°F | Ht 66.0 in | Wt 253.0 lb

## 2024-05-17 DIAGNOSIS — E1165 Type 2 diabetes mellitus with hyperglycemia: Secondary | ICD-10-CM

## 2024-05-17 DIAGNOSIS — E119 Type 2 diabetes mellitus without complications: Secondary | ICD-10-CM | POA: Diagnosis not present

## 2024-05-17 DIAGNOSIS — E785 Hyperlipidemia, unspecified: Secondary | ICD-10-CM | POA: Diagnosis not present

## 2024-05-17 DIAGNOSIS — Z794 Long term (current) use of insulin: Secondary | ICD-10-CM

## 2024-05-17 DIAGNOSIS — E669 Obesity, unspecified: Secondary | ICD-10-CM | POA: Insufficient documentation

## 2024-05-17 DIAGNOSIS — Z6841 Body Mass Index (BMI) 40.0 and over, adult: Secondary | ICD-10-CM | POA: Insufficient documentation

## 2024-05-17 DIAGNOSIS — I10 Essential (primary) hypertension: Secondary | ICD-10-CM

## 2024-05-17 DIAGNOSIS — E66813 Obesity, class 3: Secondary | ICD-10-CM

## 2024-05-17 DIAGNOSIS — Z7984 Long term (current) use of oral hypoglycemic drugs: Secondary | ICD-10-CM

## 2024-05-17 DIAGNOSIS — E1169 Type 2 diabetes mellitus with other specified complication: Secondary | ICD-10-CM | POA: Insufficient documentation

## 2024-05-17 NOTE — Progress Notes (Signed)
 Office: [phone]  /  Fax: [phone]  WEIGHT SUMMARY AND BIOMETRICS  Anthropometric Measurements Height: 5' 6 (1.676 m) Weight: 253 lb (114.8 kg) BMI (Calculated): 40.85 Peak Weight: 253 lb   Body Composition  Body Fat %: 46.5 % Fat Mass (lbs): 117.8 lbs Muscle Mass (lbs): 128.6 lbs Total Body Water (lbs): 101 lbs Visceral Fat Rating : 11   Other Clinical Data Fasting: no Labs: no Today's Visit #: Info Session Comments: Info Session    Chief Complaint: OBESITY   History of Present Illness  The patient presents for a consultation on her diagnosis of obesity. She was referred by a family who also belongs to the clinic.  She currently weighs 253 pounds, which is her highest weight to date, with a BMI of 40.9. Her visceral fat rating is 11, and body fat percentage is 46.5. She has not engaged in any formal weight loss programs or medications previously and is not currently participating in any formal exercise. She is on medications that may contribute to weight gain, including Abilify , Nexplanon, and Lantus . She experiences frequent hunger and sugar cravings. She lives with multiple family members who share cooking and grocery shopping responsibilities, and she feels some family members will support her weight loss efforts.  She has type 2 diabetes, diagnosed almost a year ago, with an A1c of 10.9 in April of this year. Her fasting glucose levels tend to run in the 130s, and postprandial levels are in the 140s to 150s. She is currently being treated with metformin  and Lantus .  She has a history of hypertension and is currently on lisinopril  20 mg daily.  She has a history of high cholesterol and wishes to address this through diet, exercise, and weight loss. She is not currently on a statin.  Results A1c: 10.9% (01/2024)        PHYSICAL EXAM:  Blood pressure 114/78, pulse 92, temperature 98.1 F (36.7 C), height 5' 6 (1.676 m), weight 253 lb (114.8 kg),  SpO2 100%. Body mass index is 40.84 kg/m.  DIAGNOSTIC DATA REVIEWED:  BMET    Component Value Date/Time   NA 140 04/18/2024 1242   K 4.2 04/18/2024 1242   CL 106 04/18/2024 1242   CO2 28 04/18/2024 1242   GLUCOSE 95 04/18/2024 1242   BUN 12 04/18/2024 1242   CREATININE 0.81 04/18/2024 1242   CREATININE 0.78 05/09/2014 1455   CALCIUM 9.3 04/18/2024 1242   GFRNONAA >60 03/08/2024 2050   GFRAA >60 07/28/2020 1223   Lab Results  Component Value Date   HGBA1C 12.8 (H) 01/31/2024   HGBA1C 5.9 05/09/2014   No results found for: INSULIN  Lab Results  Component Value Date   TSH 0.361 05/20/2022   CBC    Component Value Date/Time   WBC 7.3 03/08/2024 2050   RBC 5.06 03/08/2024 2050   HGB 13.5 03/08/2024 2050   HGB 8.8 (L) 08/23/2020 1055   HCT 42.7 03/08/2024 2050   HCT 30.6 (L) 08/23/2020 1055   PLT 187 03/08/2024 2050   PLT 353 08/23/2020 1055   MCV 84.4 03/08/2024 2050   MCV 68 (L) 08/23/2020 1055   MCH 26.7 03/08/2024 2050   MCHC 31.6 03/08/2024 2050   RDW 14.0 03/08/2024 2050   RDW 26.2 (H) 08/23/2020 1055   Iron Studies    Component Value Date/Time   IRON 289 (H) 08/14/2020 0031   TIBC 487 (H) 08/14/2020 0031   FERRITIN 4 (L) 08/14/2020 0031   FERRITIN 3 (L) 05/03/2019 1142  IRONPCTSAT 59 (H) 08/14/2020 0031   Lipid Panel     Component Value Date/Time   CHOL 139 05/20/2022 2050   TRIG 65 05/20/2022 2050   HDL 54 05/20/2022 2050   CHOLHDL 2.6 05/20/2022 2050   VLDL 13 05/20/2022 2050   LDLCALC 72 05/20/2022 2050   Hepatic Function Panel     Component Value Date/Time   PROT 6.9 04/18/2024 1242   ALBUMIN 4.3 04/18/2024 1242   AST 17 04/18/2024 1242   ALT 21 04/18/2024 1242   ALKPHOS 46 04/18/2024 1242   BILITOT 0.5 04/18/2024 1242      Component Value Date/Time   TSH 0.361 05/20/2022 2050   TSH 1.144 05/09/2014 1455   Nutritional No results found for: VD25OH    Assessment & Plan   Obesity Obesity with a BMI of 40.9, visceral fat  rating of 11, and body fat percentage of 46.5. Highest recorded weight at 253 pounds. Medications including Abilify , Nexplanon, and Lantus  may contribute to weight gain. Experiences frequent hunger and sugar cravings. Motivated and in the action stage of change for weight loss. - Schedule frequent visits every two weeks for the first six visits, then adjust frequency as appropriate. - Initiate intensive behavior modification therapy. - Discuss and evaluate options for weight loss medications based on workup results. - Conduct a workup including labs, EKG, and indirect calorimetry test to measure metabolic rate. - Start a formal eating plan tailored to her needs. - Incorporate exercise into her routine after stabilizing the nutrition plan.  Type 2 diabetes mellitus Type 2 diabetes mellitus diagnosed almost a year ago, managed with metformin  and Lantus . A1c was 10.9 in April. Fasting glucose levels in the 130s, postprandial levels in the 140s to 150s. Weight loss and dietary changes anticipated to improve glycemic control.  Hypertension Hypertension managed with lisinopril  20 mg daily. Blood pressure today is 114/78 mmHg. Aims to improve blood pressure control through diet, exercise, and weight loss.  Hyperlipidemia Hyperlipidemia, not currently on a statin. Wishes to manage cholesterol through diet, exercise, and weight loss.     I have personally spent 45 minutes total time today in preparation, patient care, and documentation for this visit, including the following: review of clinical lab tests; review of medical history, review of the pathophysiology of obesity and how certain medications can contribute to weight gain and certain diseases like diabetes affects her weight.   She was informed of the importance of frequent follow up visits to maximize her success with intensive lifestyle modifications for her multiple health conditions.    Louann Penton, MD

## 2024-05-18 ENCOUNTER — Ambulatory Visit (INDEPENDENT_AMBULATORY_CARE_PROVIDER_SITE_OTHER): Payer: MEDICAID | Admitting: Podiatry

## 2024-05-18 ENCOUNTER — Encounter: Payer: Self-pay | Admitting: Podiatry

## 2024-05-18 DIAGNOSIS — B353 Tinea pedis: Secondary | ICD-10-CM

## 2024-05-18 DIAGNOSIS — E1142 Type 2 diabetes mellitus with diabetic polyneuropathy: Secondary | ICD-10-CM | POA: Diagnosis not present

## 2024-05-18 MED ORDER — KETOCONAZOLE 2 % EX CREA: 1.0000 | TOPICAL_CREAM | Freq: Every day | CUTANEOUS | 2 refills | Status: DC

## 2024-05-18 NOTE — Progress Notes (Signed)
  Subjective:  Patient ID: Tammy Hayes, female    DOB: [birth year],   MRN: [id number]  Chief Complaint  Patient presents with   Diabetes    I am here for calluses on both feet and cracking on heels Has an Appt with Dr Dorina on 05/24/24 A1c-12.0     28 y.o. female presents for concern of dryness on both feet and cracking in her heels. Relates this has been ongoing for about a year she has tried all sorts of moisturizers but nothing helping. She does relate some itching. She also has burning and tingling in her feet   Patient is diabetic and last A1c was  Lab Results  Component Value Date   HGBA1C 12.8 (H) 01/31/2024   .   PCP:  Saguier, Edward, PA-C    . Denies any other pedal complaints. Denies n/v/f/c.   Past Medical History:  Diagnosis Date   Bilateral ovarian cysts    Depression    Pediatric overweight 07/13/2013   Pre-diabetes     Objective:  Physical Exam: Vascular: DP/PT pulses 2/4 bilateral. CFT <3 seconds.  Edema noted to bilateral lower extremities. Xerosis noted bilaterally.  Skin. No lacerations or abrasions bilateral feet. Nails 1-5 bilateral  are normal in appearance. Scaling and erythema noted in moccassin like patter on plantar bilateral feet.  Musculoskeletal: MMT 5/5 bilateral lower extremities in DF, PF, Inversion and Eversion. Deceased ROM in DF of ankle joint.  Neurological: Sensation intact to light touch. Protective sensation intact bilateral.    Assessment:   1. Tinea pedis of both feet   2. Type 2 diabetes mellitus with peripheral neuropathy (HCC)      Plan:  Patient was evaluated and treated and all questions answered. -Discussed and educated patient on diabetic foot care, especially with  regards to the vascular, neurological and musculoskeletal systems.  -Stressed the importance of good glycemic control and the detriment of not  controlling glucose levels in relation to the foot. -Discussed supportive shoes at all times and checking feet  regularly.  -Ketoconazole  sent for tinea pedis.  -Answered all patient questions -Patient to return  in 1 month for check on tinea.  -Patient advised to call the office if any problems or questions arise in the meantime.   Asberry Failing, DPM

## 2024-05-19 ENCOUNTER — Other Ambulatory Visit: Payer: Self-pay | Admitting: Medical

## 2024-05-19 DIAGNOSIS — F250 Schizoaffective disorder, bipolar type: Secondary | ICD-10-CM | POA: Diagnosis not present

## 2024-05-23 DIAGNOSIS — F250 Schizoaffective disorder, bipolar type: Secondary | ICD-10-CM | POA: Diagnosis not present

## 2024-05-24 ENCOUNTER — Ambulatory Visit (INDEPENDENT_AMBULATORY_CARE_PROVIDER_SITE_OTHER): Payer: MEDICAID | Admitting: Medical

## 2024-05-24 VITALS — BP 130/84 | HR 93 | Resp 16 | Ht 66.0 in | Wt 261.4 lb

## 2024-05-24 DIAGNOSIS — E119 Type 2 diabetes mellitus without complications: Secondary | ICD-10-CM

## 2024-05-24 DIAGNOSIS — Z0000 Encounter for general adult medical examination without abnormal findings: Secondary | ICD-10-CM

## 2024-05-24 DIAGNOSIS — F32A Depression, unspecified: Secondary | ICD-10-CM | POA: Diagnosis not present

## 2024-05-24 DIAGNOSIS — F411 Generalized anxiety disorder: Secondary | ICD-10-CM

## 2024-05-24 DIAGNOSIS — D649 Anemia, unspecified: Secondary | ICD-10-CM

## 2024-05-24 DIAGNOSIS — Z23 Encounter for immunization: Secondary | ICD-10-CM

## 2024-05-24 DIAGNOSIS — F1099 Alcohol use, unspecified with unspecified alcohol-induced disorder: Secondary | ICD-10-CM

## 2024-05-24 DIAGNOSIS — Z124 Encounter for screening for malignant neoplasm of cervix: Secondary | ICD-10-CM

## 2024-05-24 DIAGNOSIS — F250 Schizoaffective disorder, bipolar type: Secondary | ICD-10-CM | POA: Diagnosis not present

## 2024-05-24 DIAGNOSIS — Z794 Long term (current) use of insulin: Secondary | ICD-10-CM | POA: Diagnosis not present

## 2024-05-24 DIAGNOSIS — E1165 Type 2 diabetes mellitus with hyperglycemia: Secondary | ICD-10-CM
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
## 2024-05-24 NOTE — Patient Instructions (Signed)
 For you wellness exam today I have ordered cbc, cmp and  lipid panel.  Vaccines today. Hpv and Td.  Recommend exercise and healthy diet.  We will let you know lab results as they come in.  Follow up date appointment will be determined after lab review.     Alcohol use disorder Alcohol use disorder with increased cravings post-naltrexone  discontinuation. Abstained for 2-3 weeks post-injection, resumed drinking twice a week. Awaiting psychiatrist's opinion on naltrexone  continuation.  - Coordinate with psychiatrist regarding naltrexone  injections. - Check liver enzymes before restarting naltrexone . - Advise against alcohol consumption while on naltrexone .  Depression and anxiety Depression and anxiety symptoms coincide with alcohol use disorder. Awaiting psychiatrist's assessment and treatment recommendations. - Attend psychiatric evaluation. - Send MyChart message with details of psychiatric visit and psychiatrist's recommendations. - Provide psychiatrist with provider's contact information for coordination.  Type 2 diabetes mellitus Type 2 diabetes mellitus requires routine monitoring. Last A1c on February 24, 2024. No diabetic eye exam performed. -continue metformin  - Order A1c test. - Order urine microalbumin test. - Refer to optometry for diabetic eye exam. - Advise to inform optometrist of diabetes for appropriate eye exam.  General Health Maintenance Overdue for tetanus and HPV vaccines. No recent Pap smear, last one five years ago. - Administer tetanus vaccine. - Administer first dose of HPV vaccine. - Refer to gynecology for cervical cancer screening, Pap smear.

## 2024-05-25 DIAGNOSIS — F331 Major depressive disorder, recurrent, moderate: Secondary | ICD-10-CM | POA: Diagnosis not present

## 2024-05-25 LAB — CBC WITH DIFFERENTIAL/PLATELET
Basophils Absolute: 0.1 K/uL (ref 0.0–0.1)
Basophils Relative: 1.9 % (ref 0.0–3.0)
Eosinophils Absolute: 0.1 K/uL (ref 0.0–0.7)
Eosinophils Relative: 2.5 % (ref 0.0–5.0)
HCT: 35.3 % — ABNORMAL LOW (ref 36.0–46.0)
Hemoglobin: 11.6 g/dL — ABNORMAL LOW (ref 12.0–15.0)
Lymphocytes Relative: 32.5 % (ref 12.0–46.0)
Lymphs Abs: 1.6 K/uL (ref 0.7–4.0)
MCHC: 32.7 g/dL (ref 30.0–36.0)
MCV: 81.8 fl (ref 78.0–100.0)
Monocytes Absolute: 0.3 K/uL (ref 0.1–1.0)
Monocytes Relative: 6.5 % (ref 3.0–12.0)
Neutro Abs: 2.7 K/uL (ref 1.4–7.7)
Neutrophils Relative %: 56.6 % (ref 43.0–77.0)
Platelets: 180 K/uL (ref 150.0–400.0)
RBC: 4.32 Mil/uL (ref 3.87–5.11)
RDW: 14.5 % (ref 11.5–15.5)
WBC: 4.8 K/uL (ref 4.0–10.5)

## 2024-05-25 LAB — COMPREHENSIVE METABOLIC PANEL WITH GFR
ALT: 17 U/L (ref 0–35)
AST: 17 U/L (ref 0–37)
Albumin: 4.3 g/dL (ref 3.5–5.2)
Alkaline Phosphatase: 52 U/L (ref 39–117)
BUN: 12 mg/dL (ref 6–23)
CO2: 28 meq/L (ref 19–32)
Calcium: 9 mg/dL (ref 8.4–10.5)
Chloride: 105 meq/L (ref 96–112)
Creatinine, Ser: 0.77 mg/dL (ref 0.40–1.20)
GFR: 106.7 mL/min (ref >60.00)
Glucose, Bld: 112 mg/dL — ABNORMAL HIGH (ref 70–99)
Potassium: 4.1 meq/L (ref 3.5–5.1)
Sodium: 140 meq/L (ref 135–145)
Total Bilirubin: 0.5 mg/dL (ref 0.2–1.2)
Total Protein: 6.9 g/dL (ref 6.0–8.3)

## 2024-05-25 LAB — LIPID PANEL
Cholesterol: 133 mg/dL (ref 0–200)
HDL: 56.4 mg/dL (ref >39.00)
LDL Cholesterol: 67 mg/dL (ref 0–99)
NonHDL: 76.21
Total CHOL/HDL Ratio: 2
Triglycerides: 48 mg/dL (ref 0.0–149.0)
VLDL: 9.6 mg/dL (ref 0.0–40.0)

## 2024-05-25 LAB — HEMOGLOBIN A1C: Hgb A1c MFr Bld: 6.5 % (ref 4.6–6.5)

## 2024-05-25 LAB — MICROALBUMIN / CREATININE URINE RATIO
Creatinine,U: 123.5 mg/dL
Microalb Creat Ratio: 44.2 mg/g — ABNORMAL HIGH (ref 0.0–30.0)
Microalb, Ur: 5.5 mg/dL — ABNORMAL HIGH (ref 0.0–1.9)

## 2024-05-27 ENCOUNTER — Ambulatory Visit: Payer: Self-pay | Admitting: Medical

## 2024-05-27 NOTE — Addendum Note (Signed)
 Addended by: DORINA DALLAS HERO on: 05/27/2024 06:36 AM   Modules accepted: Orders

## 2024-05-27 NOTE — Addendum Note (Signed)
 Addended by: DORINA DALLAS HERO on: 05/27/2024 06:37 AM   Modules accepted: Orders

## 2024-05-31 DIAGNOSIS — F250 Schizoaffective disorder, bipolar type: Secondary | ICD-10-CM | POA: Diagnosis not present

## 2024-06-02 DIAGNOSIS — F250 Schizoaffective disorder, bipolar type: Secondary | ICD-10-CM | POA: Diagnosis not present

## 2024-06-06 DIAGNOSIS — F250 Schizoaffective disorder, bipolar type: Secondary | ICD-10-CM | POA: Diagnosis not present

## 2024-06-09 DIAGNOSIS — F331 Major depressive disorder, recurrent, moderate: Secondary | ICD-10-CM | POA: Diagnosis not present

## 2024-06-15 DIAGNOSIS — F250 Schizoaffective disorder, bipolar type: Secondary | ICD-10-CM | POA: Diagnosis not present

## 2024-06-16 ENCOUNTER — Ambulatory Visit: Payer: MEDICAID | Admitting: Family Medicine

## 2024-06-17 ENCOUNTER — Other Ambulatory Visit: Payer: MEDICAID | Admitting: Pharmacist

## 2024-06-17 ENCOUNTER — Telehealth: Payer: Self-pay | Admitting: Pharmacist

## 2024-06-17 NOTE — Telephone Encounter (Signed)
 Attempt was made to contact patient by phone today for follow up by Clinical Pharmacist regarding diabetes / medicaiton management.  Unable to reach patient. LM on VM with my contact number [phone].

## 2024-06-20 ENCOUNTER — Ambulatory Visit (INDEPENDENT_AMBULATORY_CARE_PROVIDER_SITE_OTHER): Payer: MEDICAID | Admitting: Podiatry

## 2024-06-20 DIAGNOSIS — F250 Schizoaffective disorder, bipolar type: Secondary | ICD-10-CM | POA: Diagnosis not present

## 2024-06-20 DIAGNOSIS — Z91199 Patient's noncompliance with other medical treatment and regimen due to unspecified reason: Secondary | ICD-10-CM

## 2024-06-20 NOTE — Progress Notes (Signed)
 No show

## 2024-06-23 ENCOUNTER — Ambulatory Visit: Payer: Self-pay

## 2024-06-23 ENCOUNTER — Ambulatory Visit: Payer: MEDICAID | Admitting: Family

## 2024-06-23 NOTE — Telephone Encounter (Signed)
 Pt has an apt today at [facility]

## 2024-06-23 NOTE — Telephone Encounter (Signed)
 Nurse on hold waiting for patient to ensure she has a ride for appointment: call disconnected before establishing an appointment date and time: nurse attempted to call patient back and it went straight to voicemail.

## 2024-06-23 NOTE — Telephone Encounter (Signed)
 FYI Only or Action Required?: FYI only for provider.  Patient was last seen in primary care on 05/24/2024 by Dorina Loving, PA-C.  Called Nurse Triage reporting Advice Only.  Symptoms began several weeks ago.  Interventions attempted: Other: see previous triage.  Symptoms are: gradually worsening.  Triage Disposition: See Physician Within 24 Hours (overriding See PCP Within 2 Weeks)  Patient/caregiver understands and will follow disposition?: Yes        Reason for Disposition  Requesting regular office appointment  Answer Assessment - Initial Assessment Questions 1. REASON FOR CALL: What is the main reason for your call? or How can I best help you?     Follow up from previous triage call 2. SYMPTOMS : Do you have any symptoms?      Yes, see previous triage, this RN confirmed pt not experiencing SOB, CP 3. OTHER QUESTIONS: Do you have any other questions?     None, appt made  Protocols used: Information Only Call - No Triage-A-AH

## 2024-06-23 NOTE — Telephone Encounter (Signed)
 FYI Only or Action Required?: FYI only for provider.  Patient was last seen in primary care on 05/24/2024 by Dorina Loving, PA-C.  Called Nurse Triage reporting Cough.  Symptoms began several weeks ago.  Interventions attempted: Nothing.  Symptoms are: unchanged.  Triage Disposition: See PCP When Office is Open (Within 3 Days)  Patient/caregiver understands and will follow disposition?: Yes    Copied from CRM [id number]. Topic: Clinical - Red Word Triage >> Jun 23, 2024 10:07 AM Terri MATSU wrote: Red Word that prompted transfer to Nurse Triage: Patient stated she has a bad cough, hurts when she coughs, cough is giving her a headache and she's coughing up mucous Reason for Disposition  Cough has been present for > 3 weeks  Answer Assessment - Initial Assessment Questions 1. ONSET: When did the cough begin?      X several weeks 2. SEVERITY: How bad is the cough today?      severe 3. SPUTUM: Describe the color of your sputum (e.g., none, dry cough; clear, white, yellow, green)     white 4. HEMOPTYSIS: Are you coughing up any blood? If Yes, ask: How much? (e.g., flecks, streaks, tablespoons, etc.)     no 5. DIFFICULTY BREATHING: Are you having difficulty breathing? If Yes, ask: How bad is it? (e.g., mild, moderate, severe)      mild 6. FEVER: Do you have a fever? If Yes, ask: What is your temperature, how was it measured, and when did it start?     sweaty 7. CARDIAC HISTORY: Do you have any history of heart disease? (e.g., heart attack, congestive heart failure)      na 8. LUNG HISTORY: Do you have any history of lung disease?  (e.g., pulmonary embolus, asthma, emphysema)     asthma 9. PE RISK FACTORS: Do you have a history of blood clots? (or: recent major surgery, recent prolonged travel, bedridden)     na 10. OTHER SYMPTOMS: Do you have any other symptoms? (e.g., runny nose, wheezing, chest pain)       Hoarseness, runny nose, nausea, diarrhea 11.  PREGNANCY: Is there any chance you are pregnant? When was your last menstrual period?       no 12. TRAVEL: Have you traveled out of the country in the last month? (e.g., travel history, exposures)       Na  Pt stated this cough has been going on for weeks with no relief.  Protocols used: Cough - Acute Productive-A-AH

## 2024-06-29 DIAGNOSIS — F250 Schizoaffective disorder, bipolar type: Secondary | ICD-10-CM | POA: Diagnosis not present

## 2024-06-30 DIAGNOSIS — F250 Schizoaffective disorder, bipolar type: Secondary | ICD-10-CM | POA: Diagnosis not present

## 2024-07-05 DIAGNOSIS — F331 Major depressive disorder, recurrent, moderate: Secondary | ICD-10-CM | POA: Diagnosis not present

## 2024-07-09 DIAGNOSIS — F250 Schizoaffective disorder, bipolar type: Secondary | ICD-10-CM | POA: Diagnosis not present

## 2024-07-13 DIAGNOSIS — F331 Major depressive disorder, recurrent, moderate: Secondary | ICD-10-CM | POA: Diagnosis not present

## 2024-07-15 DIAGNOSIS — F250 Schizoaffective disorder, bipolar type: Secondary | ICD-10-CM | POA: Diagnosis not present

## 2024-07-16 DIAGNOSIS — F250 Schizoaffective disorder, bipolar type: Secondary | ICD-10-CM | POA: Diagnosis not present

## 2024-07-20 DIAGNOSIS — F250 Schizoaffective disorder, bipolar type: Secondary | ICD-10-CM | POA: Diagnosis not present

## 2024-07-21 DIAGNOSIS — F331 Major depressive disorder, recurrent, moderate: Secondary | ICD-10-CM | POA: Diagnosis not present

## 2024-07-25 DIAGNOSIS — F250 Schizoaffective disorder, bipolar type: Secondary | ICD-10-CM | POA: Diagnosis not present

## 2024-07-27 DIAGNOSIS — F250 Schizoaffective disorder, bipolar type: Secondary | ICD-10-CM | POA: Diagnosis not present

## 2024-07-31 DIAGNOSIS — F250 Schizoaffective disorder, bipolar type: Secondary | ICD-10-CM | POA: Diagnosis not present

## 2024-08-02 DIAGNOSIS — F331 Major depressive disorder, recurrent, moderate: Secondary | ICD-10-CM | POA: Diagnosis not present

## 2024-08-02 DIAGNOSIS — F250 Schizoaffective disorder, bipolar type: Secondary | ICD-10-CM | POA: Diagnosis not present

## 2024-08-05 DIAGNOSIS — F250 Schizoaffective disorder, bipolar type: Secondary | ICD-10-CM | POA: Diagnosis not present

## 2024-08-11 DIAGNOSIS — F331 Major depressive disorder, recurrent, moderate: Secondary | ICD-10-CM | POA: Diagnosis not present

## 2024-08-12 DIAGNOSIS — F250 Schizoaffective disorder, bipolar type: Secondary | ICD-10-CM | POA: Diagnosis not present

## 2024-08-14 DIAGNOSIS — F250 Schizoaffective disorder, bipolar type: Secondary | ICD-10-CM | POA: Diagnosis not present

## 2024-08-25 DIAGNOSIS — F250 Schizoaffective disorder, bipolar type: Secondary | ICD-10-CM | POA: Diagnosis not present

## 2024-09-06 ENCOUNTER — Telehealth: Payer: Self-pay

## 2024-09-06 NOTE — Telephone Encounter (Signed)
 Called patient to schedule new patient appointment. Left voicemail with our contact information to call back and schedule.

## 2024-09-07 ENCOUNTER — Emergency Department (HOSPITAL_COMMUNITY)
Admission: EM | Admit: 2024-09-07 | Discharge: 2024-09-07 | Discharge status: Against Medical Advice | Payer: MEDICAID | Attending: Emergency Medicine | Admitting: Emergency Medicine

## 2024-09-07 ENCOUNTER — Other Ambulatory Visit: Payer: Self-pay

## 2024-09-07 ENCOUNTER — Encounter (HOSPITAL_COMMUNITY): Payer: Self-pay

## 2024-09-07 DIAGNOSIS — E1165 Type 2 diabetes mellitus with hyperglycemia: Secondary | ICD-10-CM | POA: Diagnosis not present

## 2024-09-07 DIAGNOSIS — Z5321 Procedure and treatment not carried out due to patient leaving prior to being seen by health care provider: Secondary | ICD-10-CM | POA: Insufficient documentation

## 2024-09-07 DIAGNOSIS — R42 Dizziness and giddiness: Secondary | ICD-10-CM | POA: Diagnosis present

## 2024-09-07 LAB — BASIC METABOLIC PANEL WITH GFR
Anion gap: 13 (ref 5–15)
BUN: 7 mg/dL (ref 6–20)
CO2: 21 mmol/L — ABNORMAL LOW (ref 22–32)
Calcium: 9.3 mg/dL (ref 8.9–10.3)
Chloride: 98 mmol/L (ref 98–111)
Creatinine, Ser: 0.78 mg/dL (ref 0.44–1.00)
GFR, Estimated: >60 mL/min (ref >60)
Glucose, Bld: 318 mg/dL — ABNORMAL HIGH (ref 70–99)
Potassium: 4 mmol/L (ref 3.5–5.1)
Sodium: 132 mmol/L — ABNORMAL LOW (ref 135–145)

## 2024-09-07 LAB — CBG MONITORING, ED: Glucose-Capillary: 322 mg/dL — ABNORMAL HIGH (ref 70–99)

## 2024-09-07 LAB — URINALYSIS, ROUTINE W REFLEX MICROSCOPIC
Bacteria, UA: NONE SEEN
Bilirubin Urine: NEGATIVE
Glucose, UA: >=500 mg/dL — AB
Hgb urine dipstick: NEGATIVE
Ketones, ur: NEGATIVE mg/dL
Nitrite: NEGATIVE
Protein, ur: NEGATIVE mg/dL
Specific Gravity, Urine: 1.025 (ref 1.005–1.030)
pH: 6 (ref 5.0–8.0)

## 2024-09-07 LAB — CBC WITH DIFFERENTIAL/PLATELET
Abs Immature Granulocytes: 0.02 K/uL (ref 0.00–0.07)
Basophils Absolute: 0 K/uL (ref 0.0–0.1)
Basophils Relative: 1 %
Eosinophils Absolute: 0.1 K/uL (ref 0.0–0.5)
Eosinophils Relative: 1 %
HCT: 39.6 % (ref 36.0–46.0)
Hemoglobin: 12.6 g/dL (ref 12.0–15.0)
Immature Granulocytes: 0 %
Lymphocytes Relative: 29 %
Lymphs Abs: 1.7 K/uL (ref 0.7–4.0)
MCH: 25.5 pg — ABNORMAL LOW (ref 26.0–34.0)
MCHC: 31.8 g/dL (ref 30.0–36.0)
MCV: 80 fL (ref 80.0–100.0)
Monocytes Absolute: 0.5 K/uL (ref 0.1–1.0)
Monocytes Relative: 9 %
Neutro Abs: 3.6 K/uL (ref 1.7–7.7)
Neutrophils Relative %: 60 %
Platelets: 153 K/uL (ref 150–400)
RBC: 4.95 MIL/uL (ref 3.87–5.11)
RDW: 14.3 % (ref 11.5–15.5)
WBC: 5.9 K/uL (ref 4.0–10.5)
nRBC: 0 % (ref 0.0–0.2)

## 2024-09-07 NOTE — ED Provider Triage Note (Signed)
 Emergency Medicine Provider Triage Evaluation Note  Tammy Hayes , a 28 y.o. female  was evaluated in triage.  Pt complains of bated blood sugars.  Patient has a primary care doctor in [facility] but she lives here now so she has not seen her.  She states her blood sugars have been running above 400 she has been very thirsty and tired she needs a new libre freestyle to check her sugars she is on metformin  only she has not seen her PCP.  Review of Systems  Positive: Polyuria Negative: Abdominal pain nausea or vomiting  Physical Exam  BP (!) 149/98 (BP Location: Right Arm)   Pulse 99   Temp (!) 97.2 F (36.2 C) (Oral)   Resp 17   Ht 5' 6 (1.676 m)   Wt 99.8 kg   LMP 08/09/2024 (Approximate)   SpO2 100%   BMI 35.51 kg/m  Gen:   Awake, no distress   Resp:  Normal effort  MSK:   Moves extremities without difficulty  Other:    Medical Decision Making  Medically screening exam initiated at 3:19 PM.  Appropriate orders placed.  DEVYNNE STURDIVANT was informed that the remainder of the evaluation will be completed by another provider, this initial triage assessment does not replace that evaluation, and the importance of remaining in the ED until their evaluation is complete.     Arloa Chroman, PA-C 09/07/24 1521

## 2024-09-07 NOTE — ED Triage Notes (Signed)
 C/O dizziness around 1200 and pt states BS was in 300's.  Hx of type 2 diabetes. Denies n/v/abd pain.

## 2024-09-07 NOTE — ED Notes (Signed)
 Pt reported she no longer wants to be seen.  Pt encouraged to stay, but chose to leave.

## 2024-09-07 NOTE — ED Notes (Signed)
 Extra red top drawn

## 2024-09-08 DIAGNOSIS — F331 Major depressive disorder, recurrent, moderate: Secondary | ICD-10-CM | POA: Diagnosis not present

## 2024-09-27 DIAGNOSIS — F331 Major depressive disorder, recurrent, moderate: Secondary | ICD-10-CM | POA: Diagnosis not present

## 2024-09-28 ENCOUNTER — Telehealth: Payer: Self-pay | Admitting: Medical

## 2024-09-28 NOTE — Telephone Encounter (Signed)
 Ok for [employer]

## 2024-09-28 NOTE — Telephone Encounter (Signed)
 Pt would like a transfer of care appt with Dr. Bennett at [facility], is that okay?

## 2024-09-29 NOTE — Telephone Encounter (Signed)
I called and left a voicemail.

## 2024-10-02 DIAGNOSIS — F250 Schizoaffective disorder, bipolar type: Secondary | ICD-10-CM | POA: Diagnosis not present

## 2024-10-06 DIAGNOSIS — F250 Schizoaffective disorder, bipolar type: Secondary | ICD-10-CM | POA: Diagnosis not present

## 2024-10-22 NOTE — Progress Notes (Deleted)
 Designer, Multimedia at [facility] [address] [place], [place] [address] [phone]  Date:  10/24/2024   Name:  Tammy Hayes   DOB:  1995-12-22   MRN:  [id number]  PCP:  Dorina Loving, PA-C    Chief Complaint: No chief complaint on file.   History of Present Illness:  Tammy Hayes is a 28 y.o. very pleasant female patient who presents with the following:  Primary patient of my partner Loving seen today with concern of finger swelling-I have not seen her myself previously History of hyperlipidemia, diabetes, obesity, severe depression  Lab Results  Component Value Date   HGBA1C 6.5 05/24/2024   Discussed the use of AI scribe software for clinical note transcription with the patient, who gave verbal consent to proceed.  History of Present Illness     Patient Active Problem List   Diagnosis Date Noted   Hyperlipidemia associated with type 2 diabetes mellitus (HCC) 05/17/2024   Obesity, unspecified 05/17/2024   BMI 40.0-44.9, adult (HCC) 05/17/2024   Generalized anxiety disorder 03/10/2024   MDD (major depressive disorder) 03/09/2024   Diabetes mellitus with hyperglycemia (HCC) 01/31/2024   Suicide attempt (HCC) 01/31/2024   Class 2 obesity 01/31/2024   DKA (diabetic ketoacidosis) (HCC) 01/31/2024   Gonorrhea 11/05/2022   Anxiety and depression 05/21/2022   MDD (major depressive disorder), recurrent severe, without psychosis (HCC) 05/20/2022   Suicidal ideation 05/20/2022   Adjustment disorder with depressed mood 05/15/2022   Urinary tract infection without hematuria 05/15/2022   Symptomatic anemia 08/14/2020   Pancytopenia (HCC) 08/14/2020   Thrombocytopenia 08/14/2020   HSIL (high grade squamous intraepithelial lesion) on Pap smear of cervix 05/09/2020   Depressive episode 05/09/2020   Hypertension 05/03/2019   Iron deficiency anemia 04/19/2019   Menorrhagia 04/19/2019   Prediabetes 07/06/2014   Acanthosis nigricans  05/09/2014   Learning disability 07/13/2013    Past Medical History:  Diagnosis Date   Bilateral ovarian cysts    Depression    Pediatric overweight 07/13/2013   Pre-diabetes     Past Surgical History:  Procedure Laterality Date   EYE MUSCLE SURGERY     in 3rd grade. No reported residual weakness.     Social History[1]  Family History  Problem Relation Age of Onset   Depression Mother        grandparents, aunts/uncles.    Heart disease Other        aunts/uncles   Hypertension Other        aunts/uncles, grandparents   Kidney disease Other        aunts/uncles, grandparents   Stroke Other        aunts/uncles   Cancer Neg Hx     Allergies[2]  Medication list has been reviewed and updated.  Medications Ordered Prior to Encounter[3]  Review of Systems:  As per HPI- otherwise negative.    Physical Examination: There were no vitals filed for this visit. There were no vitals filed for this visit. There is no height or weight on file to calculate BMI. Ideal Body Weight:    GEN: no acute distress. HEENT: Atraumatic, Normocephalic.  Ears and Nose: No external deformity. CV: RRR, No M/G/R. No JVD. No thrill. No extra heart sounds. PULM: CTA B, no wheezes, crackles, rhonchi. No retractions. No resp. distress. No accessory muscle use. ABD: S, NT, ND, +BS. No rebound. No HSM. EXTR: No c/c/e PSYCH: Normally interactive. Conversant.    Assessment and  Plan: No diagnosis found.  Assessment & Plan   Signed Harlene Schroeder, MD    [1]  Social History Tobacco Use   Smoking status: Every Day    Types: E-cigarettes   Smokeless tobacco: Never  Vaping Use   Vaping status: Some Days  Substance Use Topics   Alcohol use: Not Currently   Drug use: Not Currently    Types: Marijuana    Comment: In the past used marijuana. None in about one year.  [2]  Allergies Allergen Reactions   Penicillins Anaphylaxis, Itching and Other (See Comments)    Did it involve  swelling of the face/tongue/throat, SOB, or low BP? Yes Did it involve sudden or severe rash/hives, skin peeling, or any reaction on the inside of your mouth or nose? Y Did you need to seek medical attention at a hospital or doctor's office? Y When did it last happen?     Childhood. If all above answers are NO, may proceed with cephalosporin use.   [3]  Current Outpatient Medications on File Prior to Visit  Medication Sig Dispense Refill   ARIPiprazole  (ABILIFY ) 5 MG tablet Take 1 tablet (5 mg total) by mouth at bedtime. 30 tablet 0   blood glucose meter kit and supplies Dispense based on patient and insurance preference. Use up to four times daily as directed. (FOR ICD-10 E10.9, E11.9). 1 each 0   Continuous Glucose Receiver (FREESTYLE LIBRE 3 READER) DEVI USE AS DIRECTED 1 each 0   Continuous Glucose Sensor (FREESTYLE LIBRE 3 SENSOR) MISC Place 1 sensor on the skin every 14 days. Use to check glucose continuously 2 each 3   escitalopram  (LEXAPRO ) 10 MG tablet Take 10 mg by mouth daily.     etonogestrel (NEXPLANON) 68 MG IMPL implant 1 each by Subdermal route once.     hydrOXYzine  (ATARAX ) 25 MG tablet Take 25 mg by mouth 3 (three) times daily as needed.     Insulin  Pen Needle (PEN NEEDLES) 32G X 5 MM MISC USE ONCE DAILY WITH LANTUS  PEN 100 each 0   ketoconazole  (NIZORAL ) 2 % cream Apply 1 Application topically daily. 60 g 2   LANTUS  SOLOSTAR 100 UNIT/ML Solostar Pen Inject 10 Units into the skin at bedtime. 15 mL 0   lisinopril  (ZESTRIL ) 20 MG tablet Take 1 tablet (20 mg total) by mouth daily. 90 tablet 0   metFORMIN  (GLUCOPHAGE ) 1000 MG tablet Take 1 tablet (1,000 mg total) by mouth 2 (two) times daily with a meal. 180 tablet 0   Multiple Vitamins-Minerals (ONE DAILY CALCIUM/IRON) TABS Take 1 tablet by mouth daily.     Naltrexone  380 MG SUSR Inject 380 mg into the muscle every 30 (thirty) days.     nicotine  (EQ NICOTINE ) 14 mg/24hr patch Place 1 patch (14 mg total) onto the skin daily. 28  patch 1   nicotine  polacrilex (NICORETTE ) 2 MG gum Use as directed 2 mg in the mouth or throat every 4 (four) hours while awake.     terbinafine (LAMISIL) 250 MG tablet Take 250 mg by mouth daily.     thiamine 100 MG tablet Take 100 mg by mouth daily.     traZODone  (DESYREL ) 50 MG tablet Take 1 tablet (50 mg total) by mouth at bedtime. 30 tablet 0   Vitamin D, Ergocalciferol, (DRISDOL) 1.25 MG (50000 UNIT) CAPS capsule Take 50,000 Units by mouth once a week.     No current facility-administered medications on file prior to visit.   "

## 2024-10-24 ENCOUNTER — Ambulatory Visit: Payer: MEDICAID | Admitting: Family Medicine

## 2024-10-25 ENCOUNTER — Ambulatory Visit: Payer: MEDICAID | Admitting: Family

## 2024-11-16 ENCOUNTER — Encounter: Payer: Self-pay | Admitting: General Practice

## 2024-11-16 ENCOUNTER — Telehealth: Payer: MEDICAID | Admitting: General Practice

## 2024-11-16 VITALS — BP 112/80 | HR 90 | Temp 98.1°F | Ht 67.0 in | Wt 253.0 lb

## 2024-11-16 DIAGNOSIS — E1169 Type 2 diabetes mellitus with other specified complication: Secondary | ICD-10-CM

## 2024-11-16 DIAGNOSIS — F339 Major depressive disorder, recurrent, unspecified: Secondary | ICD-10-CM

## 2024-11-16 DIAGNOSIS — Z720 Tobacco use: Secondary | ICD-10-CM | POA: Insufficient documentation

## 2024-11-16 DIAGNOSIS — I10 Essential (primary) hypertension: Secondary | ICD-10-CM

## 2024-11-16 DIAGNOSIS — B353 Tinea pedis: Secondary | ICD-10-CM | POA: Insufficient documentation

## 2024-11-16 DIAGNOSIS — Z975 Presence of (intrauterine) contraceptive device: Secondary | ICD-10-CM | POA: Insufficient documentation

## 2024-11-16 DIAGNOSIS — Z6839 Body mass index (BMI) 39.0-39.9, adult: Secondary | ICD-10-CM

## 2024-11-16 DIAGNOSIS — F411 Generalized anxiety disorder: Secondary | ICD-10-CM

## 2024-11-16 DIAGNOSIS — Z23 Encounter for immunization: Secondary | ICD-10-CM

## 2024-11-16 DIAGNOSIS — E559 Vitamin D deficiency, unspecified: Secondary | ICD-10-CM | POA: Insufficient documentation

## 2024-11-16 DIAGNOSIS — Z7689 Persons encountering health services in other specified circumstances: Secondary | ICD-10-CM | POA: Insufficient documentation

## 2024-11-16 DIAGNOSIS — D500 Iron deficiency anemia secondary to blood loss (chronic): Secondary | ICD-10-CM

## 2024-11-16 DIAGNOSIS — R87613 High grade squamous intraepithelial lesion on cytologic smear of cervix (HGSIL): Secondary | ICD-10-CM

## 2024-11-16 DIAGNOSIS — E0865 Diabetes mellitus due to underlying condition with hyperglycemia: Secondary | ICD-10-CM

## 2024-11-16 LAB — COMPREHENSIVE METABOLIC PANEL WITH GFR
ALT: 19 U/L (ref 3–35)
AST: 17 U/L (ref 5–37)
Albumin: 4.2 g/dL (ref 3.5–5.2)
Alkaline Phosphatase: 55 U/L (ref 39–117)
BUN: 16 mg/dL (ref 6–23)
CO2: 27 meq/L (ref 19–32)
Calcium: 8.8 mg/dL (ref 8.4–10.5)
Chloride: 100 meq/L (ref 96–112)
Creatinine, Ser: 0.79 mg/dL (ref 0.40–1.20)
GFR: 103.12 mL/min
Glucose, Bld: 263 mg/dL — ABNORMAL HIGH (ref 70–99)
Potassium: 3.4 meq/L — ABNORMAL LOW (ref 3.5–5.1)
Sodium: 134 meq/L — ABNORMAL LOW (ref 135–145)
Total Bilirubin: 0.4 mg/dL (ref 0.2–1.2)
Total Protein: 7 g/dL (ref 6.0–8.3)

## 2024-11-16 LAB — LIPID PANEL
Cholesterol: 151 mg/dL (ref 28–200)
HDL: 47.6 mg/dL
LDL Cholesterol: 81 mg/dL (ref 10–99)
NonHDL: 103.5
Total CHOL/HDL Ratio: 3
Triglycerides: 111 mg/dL (ref 10.0–149.0)
VLDL: 22.2 mg/dL (ref 0.0–40.0)

## 2024-11-16 LAB — TSH: TSH: 1.32 u[IU]/mL (ref 0.35–5.50)

## 2024-11-16 LAB — CBC
HCT: 34.9 % — ABNORMAL LOW (ref 36.0–46.0)
Hemoglobin: 11.2 g/dL — ABNORMAL LOW (ref 12.0–15.0)
MCHC: 32.1 g/dL (ref 30.0–36.0)
MCV: 76.8 fl — ABNORMAL LOW (ref 78.0–100.0)
Platelets: 145 K/uL — ABNORMAL LOW (ref 150.0–400.0)
RBC: 4.55 Mil/uL (ref 3.87–5.11)
RDW: 16.7 % — ABNORMAL HIGH (ref 11.5–15.5)
WBC: 5.3 K/uL (ref 4.0–10.5)

## 2024-11-16 LAB — HEMOGLOBIN A1C: Hgb A1c MFr Bld: 12.6 % — ABNORMAL HIGH (ref 4.6–6.5)

## 2024-11-16 LAB — VITAMIN D 25 HYDROXY (VIT D DEFICIENCY, FRACTURES): VITD: 10.49 ng/mL — ABNORMAL LOW (ref 30.00–100.00)

## 2024-11-16 MED ORDER — KETOCONAZOLE 2 % EX CREA: 1.0000 | TOPICAL_CREAM | Freq: Every day | CUTANEOUS | 0 refills | Status: AC

## 2024-11-16 MED ORDER — FREESTYLE LIBRE 3 SENSOR MISC: 3 refills | Status: AC

## 2024-11-16 NOTE — Assessment & Plan Note (Signed)
 EMR reviewed briefly.

## 2024-11-16 NOTE — Assessment & Plan Note (Signed)
 Reviewed notes from podiatry. Refill sent for Ketoconozole 2% cream.  If not improved, recommend f/u with podiatry.

## 2024-11-16 NOTE — Assessment & Plan Note (Signed)
Discussed the importance of a healthy diet and regular exercise in order for weight loss, and to reduce the risk of further co-morbidity.  

## 2024-11-16 NOTE — Assessment & Plan Note (Addendum)
 BP at goal today.  Continue Lisinopril  20 mg once daily.  CMP pending. Discussed the importance of cutting back on smoking, importance of monitoring diet and exercise. F/u in 3 months.

## 2024-11-16 NOTE — Assessment & Plan Note (Signed)
 Referral placed for GYN.  Due for pap in July, 2026.

## 2024-11-16 NOTE — Assessment & Plan Note (Signed)
 Last A1c was 6.5 in July 2025.  Urine ACR UTD.  Prevnar 20 given today.  Foot exam UTD.  Statin-child bearing age.   A1c pending.  Continue Metformin  1000 mg BID.  Consider GLP-1.  Refill sent for free style libre. F/u in 3 months.  Discussed the importance of monitoring diet and exercise.

## 2024-11-16 NOTE — Assessment & Plan Note (Signed)
 Cbc pending

## 2024-11-16 NOTE — Assessment & Plan Note (Signed)
"  Vitamin d level pending   "

## 2024-11-16 NOTE — Assessment & Plan Note (Signed)
 Following with psychiatry.  Continue Lexapro  10 mg once daily per psych.

## 2024-11-16 NOTE — Patient Instructions (Addendum)
 Stop by the lab prior to leaving today. I will notify you of your results once received.   You will either be contacted via phone regarding your referral to GYN , or you may receive a letter on your MyChart portal from our referral team with instructions for scheduling an appointment. Please let us  know if you have not been contacted by anyone within two weeks.   Continue Ketocanozole cream on both feet.   Cut back on smoking and drinking.   Follow up in 3 months for chronic care management.   It was a pleasure to meet you today! Please don't hesitate to contact me with any questions. Welcome to [facility]!

## 2024-11-16 NOTE — Assessment & Plan Note (Signed)
 Controlled.  Following with psychiatry.  Continue Abilify  5 mg per psychiatry.

## 2024-11-16 NOTE — Assessment & Plan Note (Signed)
 Smoking cessation instruction/counseling given:  counseled patient on the dangers of tobacco use, advised patient to stop smoking, and reviewed strategies to maximize success.   Educated patient on patch usage and alternating sites.  F/u in 3 months.

## 2024-11-16 NOTE — Assessment & Plan Note (Signed)
 Lipid panel pending.  Discussed the importance of diet and exercise.

## 2024-11-16 NOTE — Assessment & Plan Note (Signed)
 Needs it removed.  Referral placed for GYN.

## 2024-11-16 NOTE — Progress Notes (Signed)
 "  Virtual Visit via Video Note  I connected with BITA CARTWRIGHT on 11/16/24 at 10:00 AM EST by a video enabled telemedicine application and verified that I am speaking with the correct person using two identifiers.  Patient Location: Other:  provider office. Provider Location: Home Office  I discussed the limitations, risks, security, and privacy concerns of performing an evaluation and management service by video and the availability of in person appointments. I also discussed with the patient that there may be a patient responsible charge related to this service. The patient expressed understanding and agreed to proceed.  Subjective: PCP: Vincente Shivers, NP  Chief Complaint  Patient presents with   New Patient (Initial Visit)    TOC from Dallas Maxwell, PA. Patient needs vitamin d  levels checked and rx refilled once done.    Referral    Patient needs referral done to GYN to remove implant; prefers  location.    HPI  Tammy Hayes is a 29 y.o. female presents to establish care.  Previous PCP/physical/labs: Dallas Maxwell PA. Last physical and labs- July, 2025.  HTN: diagnosed several years ago. Currently managed on Lisnopril 20 mg once daily. Does not check BP at home. Denies any chest pain, blurred vision, shortness of breath, headaches or difficulty breathing. Tolerating the medication well.  MDD/GAD: Following with psychiatry- [facility]. Currently managed on Abilify  5 mg once daily, Lexapro  10 mg once daily and hydroxyzine  25 mg as needed. She also uses Trazodone  as needed for sleep. Refills are done through psychiatry. Denies SI/HI.  Contraceptive: She states that she has a nexplanon that has been in for three years and would like it removed. She recently moved to this area and needs to establish with new GYN. She will be due for a pap smear in July. She has a history of abnormal pap smear.  DM2: diagnosed in 2025. Initially her A1c was 12. In July, 2025, her A1c was 6.5.  She is currently managed on Metformin  1000 mg BID. She is tolerating it well. She uses Free style to monitor sugars but does not recall what her numbers look like. She does report increased hunger and thirst at times. Denies any diarrhea or GI upset. No known hx of pancreatitis or thyroid  disorders. She has not had diabetic eye exam. Urine ACR was completed in July. She has not had the pneumonia vaccine. She needs a refill for her free style libre. Tenia pedis: she was evaluated by the podiatrist in July 2025 and was given ketoconazole  2% cream to apply to her feel bilaterally. She reports using it for a little while but then misplaced the cream and needs a refill.  Tobacco abuse: she reports using cigerrates and vaping. She is interested in quitting. She has the nicotine  patches at home and plans to start using them.  Alcohol abuse: drinks alcohol every other weekend. Usually finishes the bottle. Unsure the size of the bottle. No known hx of pancreatitis. She would like to quit drinking alcohol. She has tried Naltrexone  in the past but she is not sure if she finished the course. Vitamin d  deficiency: she has a history of vitamin d  deficiency. She was treated with prescription strength vitamin d  and wants to have her levels checked and get a refill.   ROS: Per HPI Current Medications[1]  Observations/Objective: Today's Vitals   11/16/24 0954  BP: 112/80  Pulse: 90  Temp: 98.1 F (36.7 C)  TempSrc: Temporal  SpO2: 99%  Weight: 253 lb (  114.8 kg)  Height: 5' 7 (1.702 m)   Physical Exam Nursing note reviewed.  Constitutional:      Appearance: Normal appearance. She is obese.  Eyes:     Conjunctiva/sclera: Conjunctivae normal.  Pulmonary:     Effort: Pulmonary effort is normal.  Neurological:     Mental Status: She is alert and oriented to person, place, and time.  Psychiatric:        Mood and Affect: Mood normal.        Behavior: Behavior normal.        Thought Content: Thought content  normal.        Judgment: Judgment normal.     Assessment and Plan: Diabetes mellitus due to underlying condition with hyperglycemia, unspecified whether long term insulin  use (HCC) Assessment & Plan: Last A1c was 6.5 in July 2025.  Urine ACR UTD.  Prevnar 20 given today.  Foot exam UTD.  Statin-child bearing age.   A1c pending.  Continue Metformin  1000 mg BID.  Consider GLP-1.  Refill sent for free style libre. F/u in 3 months.  Discussed the importance of monitoring diet and exercise.  Orders: -     FreeStyle Libre 3 Sensor; Place 1 sensor on the skin every 14 days. Use to check glucose continuously  Dispense: 2 each; Refill: 3 -     Hemoglobin A1c -     CBC -     Comprehensive metabolic panel with GFR  Establishing care with new doctor, encounter for Assessment & Plan: EMR reviewed briefly.     Vitamin D  deficiency Assessment & Plan: Vitamin d  level pending.  Orders: -     VITAMIN D  25 Hydroxy (Vit-D Deficiency, Fractures)  Primary hypertension Assessment & Plan: BP at goal today.  Continue Lisinopril  20 mg once daily.  CMP pending. Discussed the importance of cutting back on smoking, importance of monitoring diet and exercise. F/u in 3 months.   Hyperlipidemia associated with type 2 diabetes mellitus (HCC) Assessment & Plan: Lipid panel pending.  Discussed the importance of diet and exercise.  Orders: -     TSH -     Lipid panel  Nexplanon in place Assessment & Plan: Needs it removed.  Referral placed for GYN.  Orders: -     Ambulatory referral to Gynecology  Need for pneumococcal 20-valent conjugate vaccination -     Pneumococcal conjugate vaccine 20-valent  Need for influenza vaccination -     Flu vaccine trivalent PF, 6mos and older(Flulaval,Afluria,Fluarix,Fluzone )  Class 2 severe obesity due to excess calories with serious comorbidity and body mass index (BMI) of 39.0 to 39.9 in adult Assessment & Plan: Discussed the importance of a  healthy diet and regular exercise in order for weight loss, and to reduce the risk of further co-morbidity.    Tobacco abuse Assessment & Plan: Smoking cessation instruction/counseling given:  counseled patient on the dangers of tobacco use, advised patient to stop smoking, and reviewed strategies to maximize success.   Educated patient on patch usage and alternating sites.  F/u in 3 months.    Tinea pedis of both feet Assessment & Plan: Reviewed notes from podiatry. Refill sent for Ketoconozole 2% cream.  If not improved, recommend f/u with podiatry.  Orders: -     Ketoconazole ; Apply 1 Application topically daily.  Dispense: 60 g; Refill: 0  Generalized anxiety disorder Assessment & Plan: Following with psychiatry.  Continue Lexapro  10 mg once daily per psych.   HSIL (high grade squamous intraepithelial lesion) on  Pap smear of cervix Assessment & Plan: Referral placed for GYN.  Due for pap in July, 2026.   Iron deficiency anemia due to chronic blood loss Assessment & Plan: Cbc pending.   Recurrent major depressive disorder, remission status unspecified Assessment & Plan: Controlled.  Following with psychiatry.  Continue Abilify  5 mg per psychiatry.     Follow Up Instructions: Return in about 3 months (around 02/14/2025) for chronic care management..   I discussed the assessment and treatment plan with the patient. The patient was provided an opportunity to ask questions, and all were answered. The patient agreed with the plan and demonstrated an understanding of the instructions.   The patient was advised to call back or seek an in-person evaluation if the symptoms worsen or if the condition fails to improve as anticipated.  The above assessment and management plan was discussed with the patient. The patient verbalized understanding of and has agreed to the management plan.   Carrol Aurora, NP     [1]  Current Outpatient Medications:    ARIPiprazole   (ABILIFY ) 5 MG tablet, Take 1 tablet (5 mg total) by mouth at bedtime., Disp: 30 tablet, Rfl: 0   blood glucose meter kit and supplies, Dispense based on patient and insurance preference. Use up to four times daily as directed. (FOR ICD-10 E10.9, E11.9)., Disp: 1 each, Rfl: 0   Continuous Glucose Receiver (FREESTYLE LIBRE 3 READER) DEVI, USE AS DIRECTED, Disp: 1 each, Rfl: 0   escitalopram  (LEXAPRO ) 10 MG tablet, Take 10 mg by mouth daily., Disp: , Rfl:    etonogestrel (NEXPLANON) 68 MG IMPL implant, 1 each by Subdermal route once., Disp: , Rfl:    hydrOXYzine  (ATARAX ) 25 MG tablet, Take 25 mg by mouth 3 (three) times daily as needed., Disp: , Rfl:    Insulin  Pen Needle (PEN NEEDLES) 32G X 5 MM MISC, USE ONCE DAILY WITH LANTUS  PEN, Disp: 100 each, Rfl: 0   lisinopril  (ZESTRIL ) 20 MG tablet, Take 1 tablet (20 mg total) by mouth daily., Disp: 90 tablet, Rfl: 0   metFORMIN  (GLUCOPHAGE ) 1000 MG tablet, Take 1 tablet (1,000 mg total) by mouth 2 (two) times daily with a meal., Disp: 180 tablet, Rfl: 0   Multiple Vitamins-Minerals (ONE DAILY CALCIUM/IRON) TABS, Take 1 tablet by mouth daily., Disp: , Rfl:    Naltrexone  380 MG SUSR, Inject 380 mg into the muscle every 30 (thirty) days., Disp: , Rfl:    nicotine  (EQ NICOTINE ) 14 mg/24hr patch, Place 1 patch (14 mg total) onto the skin daily., Disp: 28 patch, Rfl: 1   nicotine  polacrilex (NICORETTE ) 2 MG gum, Use as directed 2 mg in the mouth or throat every 4 (four) hours while awake., Disp: , Rfl:    terbinafine (LAMISIL) 250 MG tablet, Take 250 mg by mouth daily., Disp: , Rfl:    thiamine 100 MG tablet, Take 100 mg by mouth daily., Disp: , Rfl:    traZODone  (DESYREL ) 50 MG tablet, Take 1 tablet (50 mg total) by mouth at bedtime., Disp: 30 tablet, Rfl: 0   Vitamin D , Ergocalciferol , (DRISDOL) 1.25 MG (50000 UNIT) CAPS capsule, Take 50,000 Units by mouth once a week., Disp: , Rfl:    Continuous Glucose Sensor (FREESTYLE LIBRE 3 SENSOR) MISC, Place 1 sensor on  the skin every 14 days. Use to check glucose continuously, Disp: 2 each, Rfl: 3   ketoconazole  (NIZORAL ) 2 % cream, Apply 1 Application topically daily., Disp: 60 g, Rfl: 0  "

## 2024-11-17 ENCOUNTER — Ambulatory Visit: Payer: Self-pay | Admitting: General Practice

## 2024-11-17 ENCOUNTER — Other Ambulatory Visit (HOSPITAL_COMMUNITY): Payer: Self-pay

## 2024-11-17 ENCOUNTER — Telehealth: Payer: Self-pay

## 2024-11-17 NOTE — Telephone Encounter (Signed)
 To help increase the likelihood of successful prior authorization for a continuous glucose monitor, please review the documentation and include evidence of at least two hypoglycemic events (insurers often require glucose values <=54 mg/dL) and/or clear documentation that the patient relies on insulin  to adequately control their diabetes. Thank you for your partnership.   All insulins in patient chart (Lantus , Semglee , Novolog ) are all listed as stopped.

## 2024-11-17 NOTE — Telephone Encounter (Signed)
 LMTCB to discuss this and lab results

## 2024-11-18 NOTE — Telephone Encounter (Signed)
 LMTCB to discuss this and lab results

## 2024-11-21 NOTE — Telephone Encounter (Signed)
 SABRA

## 2024-11-23 NOTE — Telephone Encounter (Signed)
 LMTCB to discuss this and results.

## 2024-11-23 NOTE — Telephone Encounter (Signed)
 Patient scheduled appt on 11/29/24 to discuss tx options

## 2024-11-29 ENCOUNTER — Ambulatory Visit: Admitting: General Practice

## 2024-12-06 ENCOUNTER — Ambulatory Visit: Admitting: General Practice
